# Patient Record
Sex: Female | Born: 1940 | Race: White | Hispanic: No | Marital: Married | Smoking: Never smoker
Health system: Southern US, Community
[De-identification: ages and names within clinical notes are randomized; demographics above are authoritative.]

## PROBLEM LIST (undated history)

## (undated) DIAGNOSIS — Z9289 Personal history of other medical treatment: Secondary | ICD-10-CM

## (undated) DIAGNOSIS — E7800 Pure hypercholesterolemia, unspecified: Secondary | ICD-10-CM

## (undated) DIAGNOSIS — G43909 Migraine, unspecified, not intractable, without status migrainosus: Secondary | ICD-10-CM

## (undated) DIAGNOSIS — J342 Deviated nasal septum: Secondary | ICD-10-CM

## (undated) DIAGNOSIS — I10 Essential (primary) hypertension: Secondary | ICD-10-CM

## (undated) DIAGNOSIS — E785 Hyperlipidemia, unspecified: Secondary | ICD-10-CM

## (undated) DIAGNOSIS — E210 Primary hyperparathyroidism: Secondary | ICD-10-CM

## (undated) DIAGNOSIS — Z8619 Personal history of other infectious and parasitic diseases: Secondary | ICD-10-CM

## (undated) DIAGNOSIS — K219 Gastro-esophageal reflux disease without esophagitis: Secondary | ICD-10-CM

## (undated) DIAGNOSIS — I2510 Atherosclerotic heart disease of native coronary artery without angina pectoris: Secondary | ICD-10-CM

## (undated) HISTORY — DX: Essential (primary) hypertension: I10

## (undated) HISTORY — PX: RHINOPLASTY: SUR1284

## (undated) HISTORY — PX: DILATION AND CURETTAGE OF UTERUS: SHX78

## (undated) HISTORY — PX: BREAST CYST ASPIRATION: SHX578

## (undated) HISTORY — PX: BUNIONECTOMY: SHX129

## (undated) HISTORY — DX: Migraine, unspecified, not intractable, without status migrainosus: G43.909

## (undated) HISTORY — DX: Gastro-esophageal reflux disease without esophagitis: K21.9

## (undated) HISTORY — PX: COLONOSCOPY: SHX174

## (undated) HISTORY — DX: Deviated nasal septum: J34.2

## (undated) HISTORY — DX: Personal history of other infectious and parasitic diseases: Z86.19

## (undated) HISTORY — PX: OTHER SURGICAL HISTORY: SHX169

## (undated) HISTORY — DX: Pure hypercholesterolemia, unspecified: E78.00

## (undated) HISTORY — DX: Personal history of other medical treatment: Z92.89

---
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
## 1968-08-24 HISTORY — PX: NOSE SURGERY: SHX723

## 1999-05-12 ENCOUNTER — Other Ambulatory Visit: Admission: RE | Admit: 1999-05-12 | Discharge: 1999-05-12 | Payer: Self-pay | Admitting: Obstetrics and Gynecology

## 2000-09-16 ENCOUNTER — Other Ambulatory Visit: Admission: RE | Admit: 2000-09-16 | Discharge: 2000-09-16 | Payer: Self-pay | Admitting: Obstetrics and Gynecology

## 2002-04-05 ENCOUNTER — Other Ambulatory Visit: Admission: RE | Admit: 2002-04-05 | Discharge: 2002-04-05 | Payer: Self-pay | Admitting: Obstetrics and Gynecology

## 2003-06-22 ENCOUNTER — Other Ambulatory Visit: Admission: RE | Admit: 2003-06-22 | Discharge: 2003-06-22 | Payer: Self-pay | Admitting: Obstetrics and Gynecology

## 2004-09-17 ENCOUNTER — Other Ambulatory Visit: Admission: RE | Admit: 2004-09-17 | Discharge: 2004-09-17 | Payer: Self-pay | Admitting: *Deleted

## 2004-09-24 ENCOUNTER — Ambulatory Visit: Payer: Self-pay

## 2005-01-14 ENCOUNTER — Ambulatory Visit: Payer: Self-pay | Admitting: Otolaryngology

## 2006-07-12 ENCOUNTER — Ambulatory Visit: Payer: Self-pay | Admitting: Unknown Physician Specialty

## 2006-07-12 LAB — HM COLONOSCOPY

## 2007-01-18 ENCOUNTER — Ambulatory Visit: Payer: Self-pay | Admitting: Obstetrics and Gynecology

## 2007-01-26 ENCOUNTER — Ambulatory Visit: Payer: Self-pay | Admitting: Obstetrics and Gynecology

## 2008-01-29 LAB — HM COLONOSCOPY

## 2011-05-25 LAB — HM MAMMOGRAPHY

## 2011-05-25 LAB — HM PAP SMEAR: HM Pap smear: NORMAL

## 2011-07-24 ENCOUNTER — Ambulatory Visit: Payer: Self-pay | Admitting: Internal Medicine

## 2011-10-16 ENCOUNTER — Ambulatory Visit: Payer: Self-pay | Admitting: Internal Medicine

## 2011-10-27 ENCOUNTER — Ambulatory Visit (INDEPENDENT_AMBULATORY_CARE_PROVIDER_SITE_OTHER): Payer: Self-pay | Admitting: Internal Medicine

## 2011-10-27 ENCOUNTER — Encounter: Payer: Self-pay | Admitting: Internal Medicine

## 2011-10-27 VITALS — BP 142/72 | HR 91 | Temp 98.2°F | Ht 59.5 in | Wt 122.0 lb

## 2011-10-27 DIAGNOSIS — R220 Localized swelling, mass and lump, head: Secondary | ICD-10-CM

## 2011-10-27 DIAGNOSIS — N390 Urinary tract infection, site not specified: Secondary | ICD-10-CM

## 2011-10-27 DIAGNOSIS — Z1239 Encounter for other screening for malignant neoplasm of breast: Secondary | ICD-10-CM

## 2011-10-27 DIAGNOSIS — R300 Dysuria: Secondary | ICD-10-CM

## 2011-10-27 LAB — COMPREHENSIVE METABOLIC PANEL
ALT: 36 U/L — ABNORMAL HIGH (ref 0–35)
AST: 32 U/L (ref 0–37)
Alkaline Phosphatase: 82 U/L (ref 39–117)
Glucose, Bld: 89 mg/dL (ref 70–99)
Sodium: 140 mEq/L (ref 135–145)
Total Bilirubin: 0.4 mg/dL (ref 0.3–1.2)
Total Protein: 7.6 g/dL (ref 6.0–8.3)

## 2011-10-27 LAB — POCT URINALYSIS DIPSTICK
Glucose, UA: NEGATIVE
Nitrite, UA: NEGATIVE
Protein, UA: NEGATIVE
Urobilinogen, UA: 0.2

## 2011-10-27 MED ORDER — SULFAMETHOXAZOLE-TRIMETHOPRIM 800-160 MG PO TABS: 1.0000 | ORAL_TABLET | Freq: Two times a day (BID) | ORAL | Status: AC

## 2011-10-27 NOTE — Assessment & Plan Note (Signed)
Previous lab work shows hypercalcemia. Patient also notes some subjective swelling in her right neck. Question if she may have parathyroid adenoma with primary hyperparathyroidism. Will check calcium and parathyroid hormone level.

## 2011-10-27 NOTE — Assessment & Plan Note (Signed)
Exam is remarkable for some prominence in the right neck but no palpable mass. Will get records on recent ultrasound performed by her previous physician. Followup one month.

## 2011-10-27 NOTE — Assessment & Plan Note (Signed)
Symptoms and urinalysis consistent with drainage tract infection. Will treat with Bactrim double strength tablet twice daily x7 days. Patient will call or return to clinic if symptoms are not improving.

## 2011-10-27 NOTE — Progress Notes (Signed)
Subjective:    Patient ID: Melissa Turner, female    DOB: [birth year], 71 y.o.   MRN: [id number]  HPI 71 year old female with history of hypertension presents to establish care. In regards to her hypertension, she reports that she was recently started on metoprolol. She brings record of her blood pressure which show most blood pressures near 140/80. She has not had any chest pain, headache, palpitations. She was also started on Xanax at the same time, because her physician felt that her hypertension was secondary to anxiety. She notes some improvement with this medication.  She is concerned about a several month history of swelling in her right neck. She notes that she was evaluated by her previous physician with ultrasound of her neck which was reportedly normal. She brings lab work today which is remarkable for elevated calcium level at 10.6. She reports that this was never addressed with additional lab work. She denies any fatigue, bony pain. She reports that she is generally feeling well. She is very active and exercises on a regular basis. She follows a healthy diet.  She is also concerned today about a several day history of foul smell to her urine. She denies any dysuria, urinary frequency, urgency, flank pain, fever, or chills.  Outpatient Encounter Prescriptions as of 10/27/2011  Medication Sig Dispense Refill  . ALPRAZolam (XANAX) 0.25 MG tablet Take 0.25 mg by mouth daily.      Marland Kitchen aspirin 325 MG tablet Take 325 mg by mouth daily.      . metoprolol succinate (TOPROL-XL) 25 MG 24 hr tablet Take 25 mg by mouth daily.      . rosuvastatin (CRESTOR) 5 MG tablet Take 5 mg by mouth daily.      Marland Kitchen sulfamethoxazole-trimethoprim (BACTRIM DS,SEPTRA DS) 800-160 MG per tablet Take 1 tablet by mouth 2 (two) times daily.  14 tablet  0    Review of Systems  Constitutional: Negative for fever, chills, appetite change, fatigue and unexpected weight change.  HENT: Negative for ear pain, congestion, sore  throat, trouble swallowing, neck pain, voice change and sinus pressure.   Eyes: Negative for visual disturbance.  Respiratory: Negative for cough, shortness of breath, wheezing and stridor.   Cardiovascular: Negative for chest pain, palpitations and leg swelling.  Gastrointestinal: Negative for nausea, vomiting, abdominal pain, diarrhea, constipation, blood in stool, abdominal distention and anal bleeding.  Genitourinary: Negative for dysuria and flank pain.  Musculoskeletal: Negative for myalgias, arthralgias and gait problem.  Skin: Negative for color change and rash.  Neurological: Negative for dizziness and headaches.  Hematological: Negative for adenopathy. Does not bruise/bleed easily.  Psychiatric/Behavioral: Negative for suicidal ideas, sleep disturbance and dysphoric mood. The patient is not nervous/anxious.    BP 142/72  Pulse 91  Temp(Src) 98.2 F (36.8 C) (Oral)  Ht 4' 11.5" (1.511 m)  Wt 122 lb (55.339 kg)  BMI 24.23 kg/m2  SpO2 98%     Objective:   Physical Exam  Constitutional: She is oriented to person, place, and time. She appears well-developed and well-nourished. No distress.  HENT:  Head: Normocephalic and atraumatic.  Right Ear: External ear normal.  Left Ear: External ear normal.  Nose: Nose normal.  Mouth/Throat: Oropharynx is clear and moist. No oropharyngeal exudate.  Eyes: Conjunctivae are normal. Pupils are equal, round, and reactive to light. Right eye exhibits no discharge. Left eye exhibits no discharge. No scleral icterus.  Neck: Normal range of motion. Neck supple. Carotid bruit is not present. No tracheal deviation present. No thyromegaly  present.    Cardiovascular: Normal rate, regular rhythm, normal heart sounds and intact distal pulses.  Exam reveals no gallop and no friction rub.   No murmur heard. Pulmonary/Chest: Effort normal and breath sounds normal. No respiratory distress. She has no wheezes. She has no rales. She exhibits no tenderness.   Musculoskeletal: Normal range of motion. She exhibits no edema and no tenderness.  Lymphadenopathy:    She has no cervical adenopathy.  Neurological: She is alert and oriented to person, place, and time. No cranial nerve deficit. She exhibits normal muscle tone. Coordination normal.  Skin: Skin is warm and dry. No rash noted. She is not diaphoretic. No erythema. No pallor.  Psychiatric: She has a normal mood and affect. Her behavior is normal. Judgment and thought content normal.          Assessment & Plan:

## 2011-10-28 ENCOUNTER — Other Ambulatory Visit (INDEPENDENT_AMBULATORY_CARE_PROVIDER_SITE_OTHER): Payer: Medicare Other | Admitting: *Deleted

## 2011-10-28 ENCOUNTER — Telehealth: Payer: Self-pay | Admitting: *Deleted

## 2011-10-28 LAB — PTH, INTACT AND CALCIUM

## 2011-10-28 NOTE — Telephone Encounter (Signed)
Received call from lab - PTH intact and calcium is required to have 2 tubes - SST and FROZEN serum from SST. Left VM for pt to call office back. She will need to come back in for redraw.

## 2011-10-28 NOTE — Telephone Encounter (Signed)
Pt coming in this afternoon  °

## 2011-10-29 LAB — PTH, INTACT AND CALCIUM
Calcium, Total (PTH): 10.4 mg/dL (ref 8.4–10.5)
PTH: 61.8 pg/mL (ref 14.0–72.0)

## 2011-11-02 ENCOUNTER — Telehealth: Payer: Self-pay | Admitting: *Deleted

## 2011-11-02 ENCOUNTER — Ambulatory Visit (INDEPENDENT_AMBULATORY_CARE_PROVIDER_SITE_OTHER): Payer: Medicare Other | Admitting: *Deleted

## 2011-11-02 DIAGNOSIS — N390 Urinary tract infection, site not specified: Secondary | ICD-10-CM

## 2011-11-02 DIAGNOSIS — R7989 Other specified abnormal findings of blood chemistry: Secondary | ICD-10-CM

## 2011-11-02 LAB — CULTURE, URINE COMPREHENSIVE: Colony Count: 10000

## 2011-11-02 NOTE — Telephone Encounter (Signed)
Referral entered, Patient informed  

## 2011-11-02 NOTE — Telephone Encounter (Signed)
Message copied by Vernie Murders on Mon Nov 02, 2011 12:45 PM ------      Message from: Ronna Polio A      Created: Thu Oct 29, 2011 12:12 PM       PTH is elevated, based on where calcium level is.  This is consistent with primary hyperparathyroidism. Given that she is experiencing swelling in her right neck, I would like to refer to endocrinology for evaluation and sestambi scan.

## 2011-11-04 LAB — URINE CULTURE: Colony Count: NO GROWTH

## 2011-11-10 ENCOUNTER — Encounter: Payer: Self-pay | Admitting: Internal Medicine

## 2011-11-12 ENCOUNTER — Encounter: Payer: Self-pay | Admitting: Internal Medicine

## 2011-11-16 ENCOUNTER — Ambulatory Visit: Payer: Self-pay | Admitting: Internal Medicine

## 2011-11-27 ENCOUNTER — Ambulatory Visit (INDEPENDENT_AMBULATORY_CARE_PROVIDER_SITE_OTHER): Payer: Medicare Other | Admitting: Internal Medicine

## 2011-11-27 ENCOUNTER — Encounter: Payer: Self-pay | Admitting: Internal Medicine

## 2011-11-27 VITALS — BP 138/86 | HR 105 | Temp 98.4°F | Ht 59.5 in | Wt 123.0 lb

## 2011-11-27 DIAGNOSIS — I10 Essential (primary) hypertension: Secondary | ICD-10-CM

## 2011-11-27 DIAGNOSIS — R221 Localized swelling, mass and lump, neck: Secondary | ICD-10-CM

## 2011-11-27 DIAGNOSIS — E210 Primary hyperparathyroidism: Secondary | ICD-10-CM

## 2011-11-27 DIAGNOSIS — R220 Localized swelling, mass and lump, head: Secondary | ICD-10-CM

## 2011-11-27 MED ORDER — METOPROLOL SUCCINATE ER 25 MG PO TB24: 25.0000 mg | ORAL_TABLET | Freq: Every day | ORAL | Status: DC

## 2011-11-27 NOTE — Assessment & Plan Note (Signed)
Suspect this may be secondary to parathyroid adenoma. As below, will set up with endocrinology for sestamibi scan and further evaluation.

## 2011-11-27 NOTE — Assessment & Plan Note (Signed)
Blood pressure is well-controlled today. Will continue metoprolol. Recent renal function was normal. Plan to repeat in 6 months.

## 2011-11-27 NOTE — Progress Notes (Signed)
  Subjective:    Patient ID: Melissa Turner, female    DOB: Jan 27, 1941, 71 y.o.   MRN: [id number]  HPI  71 year old female with history of hypertension and recent history of swelling in her right neck presents for followup. At her last visit, we send lab work including calcium level which was mildly elevated at 10.4 and PTH level which was inappropriately elevated at 61. We reviewed lab work today and discussed diagnosis of primary hyperparathyroidism. We discussed need for referral to endocrinology. Patient acknowledges some fatigue over the last 6-12 months. She also notes worsening of her depression. She has not had any kidney stones. She has never had evaluation of bone density. She continues to be concerned about swelling in her right neck.  Outpatient Encounter Prescriptions as of 11/27/2011  Medication Sig Dispense Refill  . ALPRAZolam (XANAX) 0.25 MG tablet Take 0.25 mg by mouth daily.      Marland Kitchen aspirin 325 MG tablet Take 325 mg by mouth daily.      . metoprolol succinate (TOPROL-XL) 25 MG 24 hr tablet Take 1 tablet (25 mg total) by mouth daily.  90 tablet  1  . rosuvastatin (CRESTOR) 5 MG tablet Take 5 mg by mouth daily.      Marland Kitchen DISCONTD: metoprolol succinate (TOPROL-XL) 25 MG 24 hr tablet Take 25 mg by mouth daily.      Marland Kitchen DISCONTD: metoprolol succinate (TOPROL-XL) 25 MG 24 hr tablet Take 1 tablet (25 mg total) by mouth daily.  90 tablet  1    Review of Systems  Constitutional: Positive for fatigue. Negative for fever and chills.  HENT: Negative for neck pain and neck stiffness.   Respiratory: Negative for cough and shortness of breath.   Cardiovascular: Negative for chest pain.  Gastrointestinal: Negative for abdominal pain.  Genitourinary: Negative for dysuria, frequency and flank pain.   BP 138/86  Pulse 105  Temp(Src) 98.4 F (36.9 C) (Oral)  Ht 4' 11.5" (1.511 m)  Wt 123 lb (55.792 kg)  BMI 24.43 kg/m2  SpO2 95%     Objective:   Physical Exam  Constitutional: She is  oriented to person, place, and time. She appears well-developed and well-nourished. No distress.  HENT:  Head: Normocephalic and atraumatic.  Right Ear: External ear normal.  Left Ear: External ear normal.  Nose: Nose normal.  Mouth/Throat: Oropharynx is clear and moist. No oropharyngeal exudate.  Eyes: Conjunctivae are normal. Pupils are equal, round, and reactive to light. Right eye exhibits no discharge. Left eye exhibits no discharge. No scleral icterus.  Neck: Normal range of motion. Neck supple. No tracheal deviation present. No thyromegaly present.    Cardiovascular: Normal rate, regular rhythm, normal heart sounds and intact distal pulses.  Exam reveals no gallop and no friction rub.   No murmur heard. Pulmonary/Chest: Effort normal and breath sounds normal. No respiratory distress. She has no wheezes. She has no rales. She exhibits no tenderness.  Musculoskeletal: Normal range of motion. She exhibits no edema and no tenderness.  Lymphadenopathy:    She has no cervical adenopathy.  Neurological: She is alert and oriented to person, place, and time. No cranial nerve deficit. She exhibits normal muscle tone. Coordination normal.  Skin: Skin is warm and dry. No rash noted. She is not diaphoretic. No erythema. No pallor.  Psychiatric: She has a normal mood and affect. Her behavior is normal. Judgment and thought content normal.          Assessment & Plan:

## 2011-11-27 NOTE — Assessment & Plan Note (Signed)
Mild hypercalcemia and elevated PTH are consistent with primary hyperparathyroidism. We discussed this in detail today. Will set up referral to endocrinology for further evaluation.

## 2011-11-28 ENCOUNTER — Encounter: Payer: Self-pay | Admitting: Internal Medicine

## 2011-12-07 ENCOUNTER — Encounter: Payer: Self-pay | Admitting: Internal Medicine

## 2011-12-08 ENCOUNTER — Ambulatory Visit: Payer: Medicare Other | Admitting: Endocrinology

## 2012-01-29 ENCOUNTER — Encounter: Payer: Self-pay | Admitting: Internal Medicine

## 2012-01-29 ENCOUNTER — Ambulatory Visit (INDEPENDENT_AMBULATORY_CARE_PROVIDER_SITE_OTHER): Payer: Medicare Other | Admitting: Internal Medicine

## 2012-01-29 VITALS — BP 128/86 | HR 69 | Temp 98.0°F | Resp 16 | Wt 126.5 lb

## 2012-01-29 DIAGNOSIS — E210 Primary hyperparathyroidism: Secondary | ICD-10-CM

## 2012-01-29 DIAGNOSIS — Z0000 Encounter for general adult medical examination without abnormal findings: Secondary | ICD-10-CM

## 2012-01-29 DIAGNOSIS — E785 Hyperlipidemia, unspecified: Secondary | ICD-10-CM

## 2012-01-29 NOTE — Assessment & Plan Note (Signed)
Recently seen by endocrinology for evaluation of hypercalcemia and elevated PTH. Plan was to repeat levels in 6 months. Patient currently asymptomatic. Will continue to monitor.

## 2012-01-29 NOTE — Progress Notes (Signed)
Subjective:    Patient ID: Melissa Turner, female    DOB: 02-Aug-1941, 71 y.o.   MRN: [id number]  HPI 71 year old female with history of hypercalcemia, elevated PTH consistent with primary hyperparathyroidism, hyperlipidemia and hypertension presents for followup. She was recently seen by endocrinology and had ultrasound of her neck which showed no abnormalities of the thyroid or parathyroid. She also had repeat calcium which was 10.3 and parathyroid hormone which was 30. Plan made by endocrinology was to repeat calcium and parathyroid hormone levels in 6 months. She reports that she is generally feeling well. She reports normal energy level and denies any myalgia or bony pain. She has been active and is exercising 30 minutes per day. She denies any new concerns today.  Outpatient Prescriptions Prior to Visit  Medication Sig Dispense Refill  . ALPRAZolam (XANAX) 0.25 MG tablet Take 0.25 mg by mouth daily.      Marland Kitchen aspirin 325 MG tablet Take 325 mg by mouth daily.      . metoprolol succinate (TOPROL-XL) 25 MG 24 hr tablet Take 1 tablet (25 mg total) by mouth daily.  90 tablet  1  . rosuvastatin (CRESTOR) 5 MG tablet Take 5 mg by mouth daily.        Review of Systems  Constitutional: Negative for fever, chills, appetite change, fatigue and unexpected weight change.  HENT: Negative for ear pain, congestion, sore throat, trouble swallowing, neck pain, voice change and sinus pressure.   Eyes: Negative for visual disturbance.  Respiratory: Negative for cough, shortness of breath, wheezing and stridor.   Cardiovascular: Negative for chest pain, palpitations and leg swelling.  Gastrointestinal: Negative for nausea, vomiting, abdominal pain, diarrhea, constipation, blood in stool, abdominal distention and anal bleeding.  Genitourinary: Negative for dysuria and flank pain.  Musculoskeletal: Negative for myalgias, arthralgias and gait problem.  Skin: Negative for color change and rash.  Neurological:  Negative for dizziness and headaches.  Hematological: Negative for adenopathy. Does not bruise/bleed easily.  Psychiatric/Behavioral: Negative for suicidal ideas, sleep disturbance and dysphoric mood. The patient is not nervous/anxious.    BP 128/86  Pulse 69  Temp(Src) 98 F (36.7 C) (Oral)  Resp 16  Wt 126 lb 8 oz (57.38 kg)  SpO2 98%     Objective:   Physical Exam  Constitutional: She is oriented to person, place, and time. She appears well-developed and well-nourished. No distress.  HENT:  Head: Normocephalic and atraumatic.  Right Ear: External ear normal.  Left Ear: External ear normal.  Nose: Nose normal.  Mouth/Throat: Oropharynx is clear and moist. No oropharyngeal exudate.  Eyes: Conjunctivae are normal. Pupils are equal, round, and reactive to light. Right eye exhibits no discharge. Left eye exhibits no discharge. No scleral icterus.  Neck: Normal range of motion. Neck supple. No tracheal deviation present. No thyromegaly present.  Cardiovascular: Normal rate, regular rhythm, normal heart sounds and intact distal pulses.  Exam reveals no gallop and no friction rub.   No murmur heard. Pulmonary/Chest: Effort normal and breath sounds normal. No respiratory distress. She has no wheezes. She has no rales. She exhibits no tenderness.  Musculoskeletal: Normal range of motion. She exhibits no edema and no tenderness.  Lymphadenopathy:    She has no cervical adenopathy.  Neurological: She is alert and oriented to person, place, and time. No cranial nerve deficit. She exhibits normal muscle tone. Coordination normal.  Skin: Skin is warm and dry. No rash noted. She is not diaphoretic. No erythema. No pallor.  Psychiatric: She has  a normal mood and affect. Her behavior is normal. Judgment and thought content normal.          Assessment & Plan:

## 2012-01-29 NOTE — Assessment & Plan Note (Signed)
Will plan to recheck lipids and LFTs with labs in October 2013.

## 2012-02-04 ENCOUNTER — Telehealth: Payer: Self-pay | Admitting: Internal Medicine

## 2012-02-04 NOTE — Telephone Encounter (Signed)
Error

## 2012-02-08 ENCOUNTER — Telehealth: Payer: Self-pay | Admitting: Internal Medicine

## 2012-02-08 NOTE — Telephone Encounter (Signed)
Left message on machine at home for patient to return call. 

## 2012-02-08 NOTE — Telephone Encounter (Signed)
Bone density was significantly decreased compared to previous, however scores are still above fracture risk threshold. I would recommend weight bearing activity such as walking on a regular basis. We should check Vit D level to make sure adequate. And, we should repeat bone density testing in 2 years.

## 2012-02-09 NOTE — Telephone Encounter (Signed)
Patient advised as instructed via telephone, she stated that she had vit d labs checked by Dr. Tedd Sias at [facility].  I will call [facility] to request a copy of the vit d labs.

## 2012-02-12 ENCOUNTER — Encounter: Payer: Self-pay | Admitting: Internal Medicine

## 2012-04-11 NOTE — Progress Notes (Signed)
  Subjective:    Patient ID: Melissa Turner, female    DOB: 1941/01/16, 71 y.o.   MRN: [id number]  HPI    Review of Systems     Objective:   Physical Exam        Assessment & Plan:  Nurse only

## 2012-04-15 ENCOUNTER — Other Ambulatory Visit: Payer: Self-pay | Admitting: *Deleted

## 2012-04-15 MED ORDER — ALPRAZOLAM 0.25 MG PO TABS: 0.2500 mg | ORAL_TABLET | Freq: Every day | ORAL | Status: DC

## 2012-04-15 MED ORDER — ROSUVASTATIN CALCIUM 5 MG PO TABS: 5.0000 mg | ORAL_TABLET | Freq: Every day | ORAL | Status: DC

## 2012-04-15 NOTE — Telephone Encounter (Signed)
Rx for Alprazolam called to pharmacy, patient advised via telephone. 

## 2012-04-29 ENCOUNTER — Ambulatory Visit (INDEPENDENT_AMBULATORY_CARE_PROVIDER_SITE_OTHER): Payer: Medicare Other | Admitting: Internal Medicine

## 2012-04-29 ENCOUNTER — Encounter: Payer: Self-pay | Admitting: Internal Medicine

## 2012-04-29 VITALS — BP 150/90 | HR 100 | Temp 98.4°F | Ht 59.5 in | Wt 124.8 lb

## 2012-04-29 DIAGNOSIS — E210 Primary hyperparathyroidism: Secondary | ICD-10-CM

## 2012-04-29 DIAGNOSIS — E785 Hyperlipidemia, unspecified: Secondary | ICD-10-CM

## 2012-04-29 DIAGNOSIS — R5381 Other malaise: Secondary | ICD-10-CM

## 2012-04-29 DIAGNOSIS — I10 Essential (primary) hypertension: Secondary | ICD-10-CM

## 2012-04-29 DIAGNOSIS — R5383 Other fatigue: Secondary | ICD-10-CM

## 2012-04-29 LAB — TSH: TSH: 2.47 u[IU]/mL (ref 0.35–5.50)

## 2012-04-29 LAB — COMPREHENSIVE METABOLIC PANEL
ALT: 32 U/L (ref 0–35)
AST: 32 U/L (ref 0–37)
Albumin: 4.9 g/dL (ref 3.5–5.2)
CO2: 23 mEq/L (ref 19–32)
Calcium: 10.3 mg/dL (ref 8.4–10.5)
Chloride: 105 mEq/L (ref 96–112)
Creatinine, Ser: 0.9 mg/dL (ref 0.4–1.2)
GFR: 70.42 mL/min (ref >60.00)
Potassium: 4.1 mEq/L (ref 3.5–5.1)

## 2012-04-29 LAB — LIPID PANEL
Cholesterol: 173 mg/dL (ref 0–200)
HDL: 38 mg/dL — ABNORMAL LOW (ref >39.00)
Total CHOL/HDL Ratio: 5
Triglycerides: 282 mg/dL — ABNORMAL HIGH (ref 0.0–149.0)
VLDL: 56.4 mg/dL — ABNORMAL HIGH (ref 0.0–40.0)

## 2012-04-29 LAB — T3, FREE: T3, Free: 3 pg/mL (ref 2.3–4.2)

## 2012-04-29 LAB — T4, FREE: Free T4: 0.86 ng/dL (ref 0.60–1.60)

## 2012-04-29 NOTE — Assessment & Plan Note (Signed)
History of hyperparathyroidism. Will check calcium and PTH levels with labs today. Follow up 3 months and prn.

## 2012-04-29 NOTE — Progress Notes (Signed)
  Subjective:    Patient ID: Melissa Turner, female    DOB: [birth year], 71 y.o.   MRN: [id number]  HPI 71 year old female with history of hyperlipidemia and primary hyperparathyroidism presents for followup. Over the last few months, she has noticed increased achiness in her legs. She questions whether that may be secondary to hyperparathyroidism. She is also concerned about the possibility of hypothyroidism. She notes some ongoing fatigue. In regards to hyperlipidemia, she reports full compliance with her medication. She denies any side effects such as myalgia.  Outpatient Encounter Prescriptions as of 04/29/2012  Medication Sig Dispense Refill  . ALPRAZolam (XANAX) 0.25 MG tablet Take 1 tablet (0.25 mg total) by mouth daily.  30 tablet  3  . aspirin 325 MG tablet Take 325 mg by mouth daily.      . metoprolol succinate (TOPROL-XL) 25 MG 24 hr tablet Take 1 tablet (25 mg total) by mouth daily.  90 tablet  1  . rosuvastatin (CRESTOR) 5 MG tablet Take 1 tablet (5 mg total) by mouth daily.  30 tablet  11    Review of Systems  Constitutional: Positive for fatigue. Negative for fever, chills, appetite change and unexpected weight change.  HENT: Negative for ear pain, congestion, sore throat, trouble swallowing, neck pain, voice change and sinus pressure.   Eyes: Negative for visual disturbance.  Respiratory: Negative for cough, shortness of breath, wheezing and stridor.   Cardiovascular: Negative for chest pain, palpitations and leg swelling.  Gastrointestinal: Negative for nausea, vomiting, abdominal pain, diarrhea, constipation, blood in stool, abdominal distention and anal bleeding.  Genitourinary: Negative for dysuria and flank pain.  Musculoskeletal: Positive for arthralgias. Negative for myalgias and gait problem.  Skin: Negative for color change and rash.  Neurological: Negative for dizziness and headaches.  Hematological: Negative for adenopathy. Does not bruise/bleed easily.    Psychiatric/Behavioral: Negative for suicidal ideas, disturbed wake/sleep cycle and dysphoric mood. The patient is not nervous/anxious.        Objective:   Physical Exam  Constitutional: She is oriented to person, place, and time. She appears well-developed and well-nourished. No distress.  HENT:  Head: Normocephalic and atraumatic.  Right Ear: External ear normal.  Left Ear: External ear normal.  Nose: Nose normal.  Mouth/Throat: Oropharynx is clear and moist. No oropharyngeal exudate.  Eyes: Conjunctivae are normal. Pupils are equal, round, and reactive to light. Right eye exhibits no discharge. Left eye exhibits no discharge. No scleral icterus.  Neck: Normal range of motion. Neck supple. No tracheal deviation present. No thyromegaly present.  Cardiovascular: Normal rate, regular rhythm, normal heart sounds and intact distal pulses.  Exam reveals no gallop and no friction rub.   No murmur heard. Pulmonary/Chest: Effort normal and breath sounds normal. No respiratory distress. She has no wheezes. She has no rales. She exhibits no tenderness.  Musculoskeletal: Normal range of motion. She exhibits no edema and no tenderness.  Lymphadenopathy:    She has no cervical adenopathy.  Neurological: She is alert and oriented to person, place, and time. No cranial nerve deficit. She exhibits normal muscle tone. Coordination normal.  Skin: Skin is warm and dry. No rash noted. She is not diaphoretic. No erythema. No pallor.  Psychiatric: She has a normal mood and affect. Her behavior is normal. Judgment and thought content normal.          Assessment & Plan:

## 2012-04-29 NOTE — Assessment & Plan Note (Signed)
Blood pressure slightly elevated today however patient did not take medication. She will continue to monitor blood pressure at home and call if blood pressure greater than 140/90 consistently. Followup 3 months.

## 2012-04-29 NOTE — Assessment & Plan Note (Signed)
Will check lipids and LFTs with labs today. Continue Crestor. Follow up 3 months.

## 2012-05-02 LAB — LDL CHOLESTEROL, DIRECT: Direct LDL: 100.6 mg/dL

## 2012-05-02 LAB — PTH, INTACT AND CALCIUM
Calcium, Total (PTH): 10.6 mg/dL — ABNORMAL HIGH (ref 8.4–10.5)
PTH: 30.8 pg/mL (ref 14.0–72.0)

## 2012-08-01 ENCOUNTER — Ambulatory Visit: Payer: Medicare Other | Admitting: Internal Medicine

## 2012-09-12 ENCOUNTER — Ambulatory Visit: Payer: Medicare Other | Admitting: Internal Medicine

## 2012-09-23 ENCOUNTER — Ambulatory Visit (INDEPENDENT_AMBULATORY_CARE_PROVIDER_SITE_OTHER): Payer: Medicare Other | Admitting: Internal Medicine

## 2012-09-23 ENCOUNTER — Encounter: Payer: Self-pay | Admitting: Internal Medicine

## 2012-09-23 VITALS — BP 168/70 | HR 82 | Temp 98.5°F | Ht 59.75 in | Wt 124.0 lb

## 2012-09-23 DIAGNOSIS — I10 Essential (primary) hypertension: Secondary | ICD-10-CM

## 2012-09-23 DIAGNOSIS — F411 Generalized anxiety disorder: Secondary | ICD-10-CM

## 2012-09-23 DIAGNOSIS — F419 Anxiety disorder, unspecified: Secondary | ICD-10-CM | POA: Insufficient documentation

## 2012-09-23 DIAGNOSIS — E210 Primary hyperparathyroidism: Secondary | ICD-10-CM

## 2012-09-23 DIAGNOSIS — E785 Hyperlipidemia, unspecified: Secondary | ICD-10-CM

## 2012-09-23 LAB — COMPREHENSIVE METABOLIC PANEL
ALT: 34 U/L (ref 0–35)
AST: 29 U/L (ref 0–37)
Alkaline Phosphatase: 77 U/L (ref 39–117)
BUN: 14 mg/dL (ref 6–23)
Calcium: 9.9 mg/dL (ref 8.4–10.5)
Chloride: 103 mEq/L (ref 96–112)
Creatinine, Ser: 0.7 mg/dL (ref 0.4–1.2)
Total Bilirubin: 0.7 mg/dL (ref 0.3–1.2)

## 2012-09-23 MED ORDER — ALPRAZOLAM 0.25 MG PO TABS: 0.2500 mg | ORAL_TABLET | Freq: Every day | ORAL | Status: DC

## 2012-09-23 MED ORDER — METOPROLOL SUCCINATE ER 50 MG PO TB24: 50.0000 mg | ORAL_TABLET | Freq: Every day | ORAL | Status: DC

## 2012-09-23 NOTE — Progress Notes (Signed)
Subjective:    Patient ID: Melissa Turner, female    DOB: 19-Dec-1940, 72 y.o.   MRN: [id number]  HPI 72 year old female with history of hypertension, hyperlipidemia, and anxiety presents for followup. She reports she is generally feeling well. She continues to have some ongoing fatigue which she questions may be related to her history of primary hyperparathyroidism. She notes increased stress at work. She is also providing care for her son and mother. She has been using Xanax as needed for episodes of extreme anxiety with some improvement. Next  In regards to hypertension, she notes that at home her blood pressure is typically 140s over 90s. She denies any palpitations, headache, chest pain. Reports compliance with meds.  Outpatient Encounter Prescriptions as of 09/23/2012  Medication Sig Dispense Refill  . ALPRAZolam (XANAX) 0.25 MG tablet Take 1 tablet (0.25 mg total) by mouth daily.  30 tablet  3  . aspirin 325 MG tablet Take 325 mg by mouth daily.      . metoprolol succinate (TOPROL-XL) 50 MG 24 hr tablet Take 1 tablet (50 mg total) by mouth daily.  90 tablet  1  . rosuvastatin (CRESTOR) 5 MG tablet Take 1 tablet (5 mg total) by mouth daily.  30 tablet  11   BP 168/70  Pulse 82  Temp 98.5 F (36.9 C)  Ht 4' 11.75" (1.518 m)  Wt 124 lb (56.246 kg)  BMI 24.42 kg/m2  SpO2 97%  Review of Systems  Constitutional: Negative for fever, chills, appetite change, fatigue and unexpected weight change.  HENT: Negative for ear pain, congestion, sore throat, trouble swallowing, neck pain, voice change and sinus pressure.   Eyes: Negative for visual disturbance.  Respiratory: Negative for cough, shortness of breath, wheezing and stridor.   Cardiovascular: Negative for chest pain, palpitations and leg swelling.  Gastrointestinal: Negative for nausea, vomiting, abdominal pain, diarrhea, constipation, blood in stool, abdominal distention and anal bleeding.  Genitourinary: Negative for dysuria and  flank pain.  Musculoskeletal: Negative for myalgias, arthralgias and gait problem.  Skin: Negative for color change and rash.  Neurological: Negative for dizziness and headaches.  Hematological: Negative for adenopathy. Does not bruise/bleed easily.  Psychiatric/Behavioral: Positive for sleep disturbance. Negative for suicidal ideas and dysphoric mood. The patient is nervous/anxious.        Objective:   Physical Exam  Constitutional: She is oriented to person, place, and time. She appears well-developed and well-nourished. No distress.  HENT:  Head: Normocephalic and atraumatic.  Right Ear: External ear normal.  Left Ear: External ear normal.  Nose: Nose normal.  Mouth/Throat: Oropharynx is clear and moist. No oropharyngeal exudate.  Eyes: Conjunctivae normal are normal. Pupils are equal, round, and reactive to light. Right eye exhibits no discharge. Left eye exhibits no discharge. No scleral icterus.  Neck: Normal range of motion. Neck supple. No tracheal deviation present. No thyromegaly present.  Cardiovascular: Normal rate, regular rhythm, normal heart sounds and intact distal pulses.  Exam reveals no gallop and no friction rub.   No murmur heard. Pulmonary/Chest: Effort normal and breath sounds normal. No respiratory distress. She has no wheezes. She has no rales. She exhibits no tenderness.  Musculoskeletal: Normal range of motion. She exhibits no edema and no tenderness.  Lymphadenopathy:    She has no cervical adenopathy.  Neurological: She is alert and oriented to person, place, and time. No cranial nerve deficit. She exhibits normal muscle tone. Coordination normal.  Skin: Skin is warm and dry. No rash noted. She is not  diaphoretic. No erythema. No pallor.  Psychiatric: She has a normal mood and affect. Her behavior is normal. Judgment and thought content normal.          Assessment & Plan:

## 2012-09-23 NOTE — Assessment & Plan Note (Signed)
Persistent symptoms of fatigue. Will recheck PTH and calcium.

## 2012-09-23 NOTE — Assessment & Plan Note (Signed)
Continue alprazolam prn. If symptoms persistent, discussed starting SSRI. Pt will call or email if she would like to start long acting medication.

## 2012-09-23 NOTE — Assessment & Plan Note (Signed)
Blood pressure slightly elevated today and has been elevated at home. Will increase metoprolol to 50 mg daily. Patient will monitor blood pressure at home and call or e-mail with readings. Followup in 3 months.

## 2012-10-03 ENCOUNTER — Telehealth: Payer: Self-pay | Admitting: Internal Medicine

## 2012-10-03 NOTE — Telephone Encounter (Signed)
Called and spoke with patient, she stated she really did not understand the lab note on mychart. Informed patient labs showed an abnormal thyroid level and Dr. Dan Humphreys would like for her to see the endocrinologist. Per patient she has been seen by endo previously, so she can call to schedule that appt.

## 2012-10-03 NOTE — Telephone Encounter (Signed)
Pt came by and was wanting to know about her labs results in detail and about having a referral.

## 2012-10-08 ENCOUNTER — Other Ambulatory Visit: Payer: Self-pay

## 2012-11-07 ENCOUNTER — Telehealth: Payer: Self-pay | Admitting: Internal Medicine

## 2012-11-07 MED ORDER — ROSUVASTATIN CALCIUM 5 MG PO TABS: 5.0000 mg | ORAL_TABLET | Freq: Every day | ORAL | Status: DC

## 2012-11-07 NOTE — Telephone Encounter (Signed)
Pt has one pill left of her Crestor and she would like to try Lipitor or Zocor if possible.

## 2012-11-07 NOTE — Telephone Encounter (Signed)
LMTCB Crestor already sent

## 2012-11-07 NOTE — Telephone Encounter (Signed)
Will this be ok? 

## 2012-11-07 NOTE — Telephone Encounter (Signed)
Is this because of cost? We can try Atorvastatin 10mg  daily. #30, 3 refill. We will need to repeat lipids and CMP in 1 month after starting.

## 2012-11-09 MED ORDER — ATORVASTATIN CALCIUM 10 MG PO TABS: 10.0000 mg | ORAL_TABLET | Freq: Every day | ORAL | Status: DC

## 2012-11-09 NOTE — Telephone Encounter (Signed)
Rx for Atorvastatin sent to pharmacy, left message for patient to call back.

## 2012-11-10 NOTE — Telephone Encounter (Signed)
Patient aware to come in for fasting labs in a month, she has an appointment with Dr. Dan Humphreys 4/21

## 2012-11-14 ENCOUNTER — Ambulatory Visit: Payer: Self-pay

## 2012-12-12 ENCOUNTER — Encounter: Payer: Self-pay | Admitting: Internal Medicine

## 2012-12-12 ENCOUNTER — Ambulatory Visit (INDEPENDENT_AMBULATORY_CARE_PROVIDER_SITE_OTHER): Payer: Medicare Other | Admitting: Internal Medicine

## 2012-12-12 VITALS — BP 154/88 | HR 73 | Temp 98.2°F | Ht 59.5 in | Wt 125.0 lb

## 2012-12-12 DIAGNOSIS — M542 Cervicalgia: Secondary | ICD-10-CM

## 2012-12-12 DIAGNOSIS — Z23 Encounter for immunization: Secondary | ICD-10-CM

## 2012-12-12 DIAGNOSIS — E210 Primary hyperparathyroidism: Secondary | ICD-10-CM

## 2012-12-12 DIAGNOSIS — F419 Anxiety disorder, unspecified: Secondary | ICD-10-CM

## 2012-12-12 DIAGNOSIS — E785 Hyperlipidemia, unspecified: Secondary | ICD-10-CM

## 2012-12-12 DIAGNOSIS — I10 Essential (primary) hypertension: Secondary | ICD-10-CM

## 2012-12-12 DIAGNOSIS — F411 Generalized anxiety disorder: Secondary | ICD-10-CM

## 2012-12-12 DIAGNOSIS — Z0000 Encounter for general adult medical examination without abnormal findings: Secondary | ICD-10-CM

## 2012-12-12 LAB — CBC WITH DIFFERENTIAL/PLATELET
Basophils Absolute: 0 10*3/uL (ref 0.0–0.1)
Eosinophils Absolute: 0.2 10*3/uL (ref 0.0–0.7)
Eosinophils Relative: 2.8 % (ref 0.0–5.0)
HCT: 43.2 % (ref 36.0–46.0)
Lymphs Abs: 1.7 10*3/uL (ref 0.7–4.0)
MCHC: 33.8 g/dL (ref 30.0–36.0)
MCV: 94.3 fl (ref 78.0–100.0)
Monocytes Absolute: 0.5 10*3/uL (ref 0.1–1.0)
Platelets: 264 10*3/uL (ref 150.0–400.0)
RDW: 12.7 % (ref 11.5–14.6)

## 2012-12-12 LAB — TSH: TSH: 1.54 u[IU]/mL (ref 0.35–5.50)

## 2012-12-12 LAB — COMPREHENSIVE METABOLIC PANEL
BUN: 15 mg/dL (ref 6–23)
CO2: 30 mEq/L (ref 19–32)
Calcium: 9.8 mg/dL (ref 8.4–10.5)
Chloride: 103 mEq/L (ref 96–112)
Creatinine, Ser: 0.7 mg/dL (ref 0.4–1.2)
GFR: 89.42 mL/min (ref >60.00)
Total Bilirubin: 0.8 mg/dL (ref 0.3–1.2)

## 2012-12-12 LAB — LIPID PANEL
Cholesterol: 187 mg/dL (ref 0–200)
HDL: 28.8 mg/dL — ABNORMAL LOW (ref >39.00)
Triglycerides: 112 mg/dL (ref 0.0–149.0)

## 2012-12-12 NOTE — Assessment & Plan Note (Signed)
General medical exam including breast exam normal today. Pap and pelvic deferred per patient preference and given normal pelvic and Pap in 2012. Appropriate screening performed. Encourage continued efforts at healthy diet and regular physical activity. Will check labs today including CBC, CMP, lipid profile, and hepatitis C. Mammogram scheduled.

## 2012-12-12 NOTE — Assessment & Plan Note (Signed)
Patient reports that recent sestamibi scan was negative. However, calcium and PTH level continues to be elevated. Will check vitamin D level with labs today. Question she may have secondary hyperparathyroidism from vit D deficiency.

## 2012-12-12 NOTE — Assessment & Plan Note (Addendum)
Chronic right-sided neck pain. Extensive evaluation including ENT evaluation has been normal. Will get carotid dopplers for further evaluation. Question if aneurysm might lead to symptoms of chronic mild pain.

## 2012-12-12 NOTE — Progress Notes (Signed)
Subjective:    Patient ID: Melissa Turner, female    DOB: 12/04/1940, 72 y.o.   MRN: [id number]  HPI The patient is here for annual Medicare wellness examination and management of other chronic and acute problems.   The risk factors are reflected in the social history.  The roster of all physicians providing medical care to patient - is listed in the Snapshot section of the chart.  Activities of daily living:  The patient is 100% independent in all ADLs: dressing, toileting, feeding as well as independent mobility  Home safety : The patient has smoke detectors in the home. They wear seatbelts.  There are no firearms at home. There is no violence in the home.   There is no risks for hepatitis, STDs or HIV. There is a history of blood transfusion with childbirth in 1967. They have no travel history to infectious disease endemic areas of the world.  The patient has seen their dentist in the last six month. (Dr. Lane Hacker) They have seen their eye doctor in the last year. [facility]) No issues with hearing.  They have deferred audiologic testing in the last year.  Testing in past was normal. do not  have excessive sun exposure. Discussed the need for sun protection: hats, long sleeves and use of sunscreen if there is significant sun exposure. (Dr. Gwen Pounds, [facility])  Diet: the importance of a healthy diet is discussed. They do have a healthy diet.  The benefits of regular aerobic exercise were discussed. She is very active and exercises regularly.  Depression screen: there are no signs or vegative symptoms of depression- irritability, change in appetite, anhedonia, sadness/tearfullness.  Cognitive assessment: the patient manages all their financial and personal affairs and is actively engaged. They could relate day,date,year and events.  The following portions of the patient's history were reviewed and updated as appropriate: allergies, current medications, past family history, past  medical history,  past surgical history, past social history  and problem list.  Visual acuity was not assessed per patient preference since she has regular follow up with her ophthalmologist. Hearing and body mass index were assessed and reviewed.   During the course of the visit the patient was educated and counseled about appropriate screening and preventive services including : fall prevention , diabetes screening, nutrition counseling, colorectal cancer screening, and recommended immunizations.    Patient continues to have right-sided neck pain. This has been ongoing for years. Has been evaluated by ENT physician. No etiology has been determined. She continues to describe aching pain in her right neck, just under her right ear which is chronic. She does not take medication for this.  Outpatient Encounter Prescriptions as of 12/12/2012  Medication Sig Dispense Refill  . ALPRAZolam (XANAX) 0.25 MG tablet Take 1 tablet (0.25 mg total) by mouth daily.  30 tablet  3  . aspirin 325 MG tablet Take 325 mg by mouth daily.      Marland Kitchen atorvastatin (LIPITOR) 10 MG tablet Take 1 tablet (10 mg total) by mouth daily.  30 tablet  3  . metoprolol succinate (TOPROL-XL) 50 MG 24 hr tablet Take 1 tablet (50 mg total) by mouth daily.  90 tablet  1  . rosuvastatin (CRESTOR) 5 MG tablet Take 1 tablet (5 mg total) by mouth daily.  30 tablet  5   No facility-administered encounter medications on file as of 12/12/2012.   BP 154/88  Pulse 73  Temp(Src) 98.2 F (36.8 C) (Oral)  Ht 4' 11.5" (1.511 m)  Wt 125 lb (56.7 kg)  BMI 24.83 kg/m2  SpO2 97%  Review of Systems  Constitutional: Negative for fever, chills, appetite change, fatigue and unexpected weight change.  HENT: Positive for neck pain. Negative for ear pain, congestion, sore throat, trouble swallowing, voice change and sinus pressure.   Eyes: Negative for visual disturbance.  Respiratory: Negative for cough, shortness of breath, wheezing and stridor.    Cardiovascular: Negative for chest pain, palpitations and leg swelling.  Gastrointestinal: Negative for nausea, vomiting, abdominal pain, diarrhea, constipation, blood in stool, abdominal distention and anal bleeding.  Genitourinary: Negative for dysuria and flank pain.  Musculoskeletal: Negative for myalgias, arthralgias and gait problem.  Skin: Negative for color change and rash.  Neurological: Negative for dizziness and headaches.  Hematological: Negative for adenopathy. Does not bruise/bleed easily.  Psychiatric/Behavioral: Negative for suicidal ideas, sleep disturbance and dysphoric mood. The patient is not nervous/anxious.        Objective:   Physical Exam  Constitutional: She is oriented to person, place, and time. She appears well-developed and well-nourished. No distress.  HENT:  Head: Normocephalic and atraumatic.  Right Ear: External ear normal.  Left Ear: External ear normal.  Nose: Nose normal.  Mouth/Throat: Oropharynx is clear and moist. No oropharyngeal exudate.  Eyes: Conjunctivae are normal. Pupils are equal, round, and reactive to light. Right eye exhibits no discharge. Left eye exhibits no discharge. No scleral icterus.  Neck: Normal range of motion. Neck supple. No tracheal deviation present. No thyromegaly present.  Cardiovascular: Normal rate, regular rhythm, normal heart sounds and intact distal pulses.  Exam reveals no gallop and no friction rub.   No murmur heard. Pulmonary/Chest: Effort normal and breath sounds normal. No accessory muscle usage. Not tachypneic. No respiratory distress. She has no decreased breath sounds. She has no wheezes. She has no rhonchi. She has no rales. She exhibits no tenderness. Right breast exhibits no inverted nipple, no mass, no nipple discharge, no skin change and no tenderness. Left breast exhibits no inverted nipple, no mass, no nipple discharge, no skin change and no tenderness. Breasts are symmetrical.  Abdominal: Soft. Bowel  sounds are normal. She exhibits no distension and no mass. There is no tenderness. There is no rebound and no guarding.  Musculoskeletal: Normal range of motion. She exhibits no edema and no tenderness.  Lymphadenopathy:    She has no cervical adenopathy.  Neurological: She is alert and oriented to person, place, and time. No cranial nerve deficit. She exhibits normal muscle tone. Coordination normal.  Skin: Skin is warm and dry. No rash noted. She is not diaphoretic. No erythema. No pallor.  Psychiatric: She has a normal mood and affect. Her behavior is normal. Judgment and thought content normal.          Assessment & Plan:

## 2012-12-12 NOTE — Assessment & Plan Note (Signed)
BP Readings from Last 3 Encounters:  12/12/12 154/88  09/23/12 168/70  04/29/12 150/90   And blood pressure has generally been well-controlled at home on current medications. Will continue to monitor. Patient will call if consistently greater than 150/90.

## 2012-12-14 ENCOUNTER — Telehealth: Payer: Self-pay | Admitting: Internal Medicine

## 2012-12-14 NOTE — Telephone Encounter (Signed)
US carotid normal 4/21

## 2013-01-02 ENCOUNTER — Encounter: Payer: Self-pay | Admitting: Internal Medicine

## 2013-01-18 ENCOUNTER — Encounter: Payer: Self-pay | Admitting: Internal Medicine

## 2013-04-16 ENCOUNTER — Other Ambulatory Visit: Payer: Self-pay | Admitting: Internal Medicine

## 2013-04-17 NOTE — Telephone Encounter (Signed)
Last OV 4/14

## 2013-04-17 NOTE — Telephone Encounter (Signed)
Rx faxed to pharmacy  

## 2013-04-27 ENCOUNTER — Telehealth: Payer: Self-pay | Admitting: *Deleted

## 2013-04-27 NOTE — Telephone Encounter (Signed)
Patient left message on voicemail stating she would like to change her Metoprolol to 25 mg instead of 50 mg. The 50 mg are too strong for her

## 2013-04-27 NOTE — Telephone Encounter (Signed)
We should bring her in to check BP first.

## 2013-04-28 NOTE — Telephone Encounter (Signed)
Patient informed, she stated she would just keep using the 50 mg. She will just cut them in half like she being doing and wait until she comes in for her appointment on 10/24 with Dr. Dan Humphreys.

## 2013-06-15 ENCOUNTER — Encounter: Payer: Self-pay | Admitting: *Deleted

## 2013-06-16 ENCOUNTER — Encounter: Payer: Self-pay | Admitting: Internal Medicine

## 2013-06-16 ENCOUNTER — Ambulatory Visit (INDEPENDENT_AMBULATORY_CARE_PROVIDER_SITE_OTHER): Payer: Medicare Other | Admitting: Internal Medicine

## 2013-06-16 VITALS — BP 158/84 | HR 99 | Temp 98.5°F | Wt 128.0 lb

## 2013-06-16 DIAGNOSIS — I10 Essential (primary) hypertension: Secondary | ICD-10-CM

## 2013-06-16 DIAGNOSIS — E785 Hyperlipidemia, unspecified: Secondary | ICD-10-CM

## 2013-06-16 LAB — COMPREHENSIVE METABOLIC PANEL
Alkaline Phosphatase: 78 U/L (ref 39–117)
BUN: 15 mg/dL (ref 6–23)
CO2: 26 mEq/L (ref 19–32)
GFR: 85.01 mL/min (ref >60.00)
Glucose, Bld: 68 mg/dL — ABNORMAL LOW (ref 70–99)
Total Bilirubin: 0.4 mg/dL (ref 0.3–1.2)

## 2013-06-16 NOTE — Assessment & Plan Note (Signed)
Lipids well-controlled on atorvastatin. Will recheck LFTs with labs today.

## 2013-06-16 NOTE — Progress Notes (Signed)
Subjective:    Patient ID: Melissa Turner, female    DOB: 1941-03-09, 72 y.o.   MRN: [id number]  HPI 72 year old female with history of hyperlipidemia and hypertension presents for followup. She reports she is feeling well. She is compliant with medication however did not take her metoprolol this morning before her appointment. When blood pressures checked at home it is generally less than 130/80. She denies any headache, palpitations, chest pain. She is physically active and follows a healthy diet.  Outpatient Encounter Prescriptions as of 06/16/2013  Medication Sig Dispense Refill  . ALPRAZolam (XANAX) 0.25 MG tablet TAKE 1 TABLET BY MOUTH EVERY DAY  30 tablet  3  . aspirin 325 MG tablet Take 325 mg by mouth daily.      Marland Kitchen atorvastatin (LIPITOR) 10 MG tablet Take 1 tablet (10 mg total) by mouth daily.  30 tablet  3  . Magnesium 500 MG TABS Take 250 mg by mouth every other day.      . metoprolol succinate (TOPROL-XL) 50 MG 24 hr tablet Take 1 tablet (50 mg total) by mouth daily.  90 tablet  1   No facility-administered encounter medications on file as of 06/16/2013.   BP 158/84  Pulse 99  Temp(Src) 98.5 F (36.9 C) (Oral)  Wt 128 lb (58.06 kg)  BMI 25.43 kg/m2  SpO2 98%  Review of Systems  Constitutional: Negative for fever, chills, appetite change, fatigue and unexpected weight change.  HENT: Negative for congestion, ear pain, sinus pressure, sore throat, trouble swallowing and voice change.   Eyes: Negative for visual disturbance.  Respiratory: Negative for cough, shortness of breath, wheezing and stridor.   Cardiovascular: Negative for chest pain, palpitations and leg swelling.  Gastrointestinal: Negative for nausea, vomiting, abdominal pain, diarrhea, constipation, blood in stool, abdominal distention and anal bleeding.  Genitourinary: Negative for dysuria and flank pain.  Musculoskeletal: Negative for arthralgias, gait problem, myalgias and neck pain.  Skin: Negative for color  change and rash.  Neurological: Negative for dizziness and headaches.  Hematological: Negative for adenopathy. Does not bruise/bleed easily.  Psychiatric/Behavioral: Negative for suicidal ideas, sleep disturbance and dysphoric mood. The patient is not nervous/anxious.        Objective:   Physical Exam  Constitutional: She is oriented to person, place, and time. She appears well-developed and well-nourished. No distress.  HENT:  Head: Normocephalic and atraumatic.  Right Ear: External ear normal.  Left Ear: External ear normal.  Nose: Nose normal.  Mouth/Throat: Oropharynx is clear and moist. No oropharyngeal exudate.  Eyes: Conjunctivae are normal. Pupils are equal, round, and reactive to light. Right eye exhibits no discharge. Left eye exhibits no discharge. No scleral icterus.  Neck: Normal range of motion. Neck supple. No tracheal deviation present. No thyromegaly present.  Cardiovascular: Normal rate, regular rhythm, normal heart sounds and intact distal pulses.  Exam reveals no gallop and no friction rub.   No murmur heard. Pulmonary/Chest: Effort normal and breath sounds normal. No accessory muscle usage. Not tachypneic. No respiratory distress. She has no decreased breath sounds. She has no wheezes. She has no rhonchi. She has no rales. She exhibits no tenderness.  Musculoskeletal: Normal range of motion. She exhibits no edema and no tenderness.  Lymphadenopathy:    She has no cervical adenopathy.  Neurological: She is alert and oriented to person, place, and time. No cranial nerve deficit. She exhibits normal muscle tone. Coordination normal.  Skin: Skin is warm and dry. No rash noted. She is not diaphoretic. No  erythema. No pallor.  Psychiatric: She has a normal mood and affect. Her behavior is normal. Judgment and thought content normal.          Assessment & Plan:

## 2013-06-16 NOTE — Assessment & Plan Note (Signed)
BP Readings from Last 3 Encounters:  06/16/13 158/84  12/12/12 154/88  09/23/12 168/70   Blood pressure generally well-controlled with metoprolol. Blood pressure noted to be slightly elevated today however patient did not take her medication this morning. Encouraged her to monitor blood pressure at home. She will call if blood pressure consistently greater than 140/90.

## 2013-08-08 ENCOUNTER — Other Ambulatory Visit: Payer: Self-pay | Admitting: Internal Medicine

## 2013-08-20 ENCOUNTER — Other Ambulatory Visit: Payer: Self-pay | Admitting: Internal Medicine

## 2013-12-15 ENCOUNTER — Ambulatory Visit (INDEPENDENT_AMBULATORY_CARE_PROVIDER_SITE_OTHER): Payer: Commercial Managed Care - HMO | Admitting: Internal Medicine

## 2013-12-15 ENCOUNTER — Encounter: Payer: Self-pay | Admitting: Internal Medicine

## 2013-12-15 VITALS — BP 150/82 | HR 89 | Temp 98.2°F | Ht 59.75 in | Wt 127.0 lb

## 2013-12-15 DIAGNOSIS — Z23 Encounter for immunization: Secondary | ICD-10-CM

## 2013-12-15 DIAGNOSIS — Z0000 Encounter for general adult medical examination without abnormal findings: Secondary | ICD-10-CM

## 2013-12-15 DIAGNOSIS — E210 Primary hyperparathyroidism: Secondary | ICD-10-CM

## 2013-12-15 DIAGNOSIS — Z1239 Encounter for other screening for malignant neoplasm of breast: Secondary | ICD-10-CM

## 2013-12-15 LAB — COMPREHENSIVE METABOLIC PANEL
ALK PHOS: 81 U/L (ref 39–117)
ALT: 39 U/L — ABNORMAL HIGH (ref 0–35)
AST: 30 U/L (ref 0–37)
Albumin: 4.5 g/dL (ref 3.5–5.2)
BILIRUBIN TOTAL: 0.9 mg/dL (ref 0.3–1.2)
BUN: 14 mg/dL (ref 6–23)
CO2: 25 meq/L (ref 19–32)
CREATININE: 0.7 mg/dL (ref 0.4–1.2)
Calcium: 10.8 mg/dL — ABNORMAL HIGH (ref 8.4–10.5)
Chloride: 104 mEq/L (ref 96–112)
GFR: 84.89 mL/min (ref >60.00)
Glucose, Bld: 77 mg/dL (ref 70–99)
Potassium: 4.2 mEq/L (ref 3.5–5.1)
SODIUM: 137 meq/L (ref 135–145)
Total Protein: 7.1 g/dL (ref 6.0–8.3)

## 2013-12-15 LAB — CBC WITH DIFFERENTIAL/PLATELET
Basophils Absolute: 0 10*3/uL (ref 0.0–0.1)
Basophils Relative: 0.6 % (ref 0.0–3.0)
EOS PCT: 3.3 % (ref 0.0–5.0)
Eosinophils Absolute: 0.2 10*3/uL (ref 0.0–0.7)
HEMATOCRIT: 44.2 % (ref 36.0–46.0)
HEMOGLOBIN: 15.1 g/dL — AB (ref 12.0–15.0)
Lymphocytes Relative: 29.2 % (ref 12.0–46.0)
Lymphs Abs: 1.9 10*3/uL (ref 0.7–4.0)
MCHC: 34.1 g/dL (ref 30.0–36.0)
MCV: 95.5 fl (ref 78.0–100.0)
MONO ABS: 0.6 10*3/uL (ref 0.1–1.0)
MONOS PCT: 8.6 % (ref 3.0–12.0)
NEUTROS ABS: 3.8 10*3/uL (ref 1.4–7.7)
Neutrophils Relative %: 58.3 % (ref 43.0–77.0)
PLATELETS: 283 10*3/uL (ref 150.0–400.0)
RBC: 4.63 Mil/uL (ref 3.87–5.11)
RDW: 12.8 % (ref 11.5–14.6)
WBC: 6.5 10*3/uL (ref 4.5–10.5)

## 2013-12-15 LAB — LIPID PANEL
CHOLESTEROL: 193 mg/dL (ref 0–200)
HDL: 32.1 mg/dL — AB (ref >39.00)
LDL CALC: 115 mg/dL — AB (ref 0–99)
TRIGLYCERIDES: 229 mg/dL — AB (ref 0.0–149.0)
Total CHOL/HDL Ratio: 6
VLDL: 45.8 mg/dL — ABNORMAL HIGH (ref 0.0–40.0)

## 2013-12-15 LAB — MICROALBUMIN / CREATININE URINE RATIO
CREATININE, U: 29.4 mg/dL
Microalb Creat Ratio: 2 mg/g (ref 0.0–30.0)
Microalb, Ur: 0.6 mg/dL (ref 0.0–1.9)

## 2013-12-15 NOTE — Progress Notes (Signed)
Subjective:    Patient ID: Melissa Turner, female    DOB: 05-Mar-1941, 73 y.o.   MRN: [id number]  HPI The roster of all physicians providing medical care to patient - is listed in the Snapshot section of the chart.  Activities of daily living:  The patient is 100% independent in all ADLs: dressing, toileting, feeding as well as independent mobility. Lives in [place] with husband and 2 grandchildren. Pets, dogs. Two story home.  Home safety : The patient has smoke detectors in the home. They wear seatbelts.  There are no firearms at home. There is no violence in the home.   There is no risks for hepatitis, STDs or HIV. There is a history of blood transfusion with childbirth in 1967. They have no travel history to infectious disease endemic areas of the world.  The patient has seen their dentist in the last six month. (Dr. Hilliard Clark) They have seen their eye doctor in the last year. [facility] -Dr. Tobe Sos) No issues with hearing.  They have deferred audiologic testing in the last year.  Testing in past was normal. do not  have excessive sun exposure. Discussed the need for sun protection: hats, long sleeves and use of sunscreen if there is significant sun exposure. (Dr. Nehemiah Massed, [facility])  Diet: the importance of a healthy diet is discussed. They do have a healthy diet.  The benefits of regular aerobic exercise were discussed. She is very active and exercises regularly.  Depression screen: there are no signs or vegative symptoms of depression- irritability, change in appetite, anhedonia, sadness/tearfullness.  Cognitive assessment: the patient manages all their financial and personal affairs and is actively engaged. They could relate day,date,year and events.  The following portions of the patient's history were reviewed and updated as appropriate: allergies, current medications, past family history, past medical history,  past surgical history, past social history  and problem  list.  Visual acuity was not assessed per patient preference since she has regular follow up with her ophthalmologist. Hearing and body mass index were assessed and reviewed.   During the course of the visit the patient was educated and counseled about appropriate screening and preventive services including : fall prevention , diabetes screening, nutrition counseling, colorectal cancer screening, and recommended immunizations.    Review of Systems  Constitutional: Negative for fever, chills, appetite change, fatigue and unexpected weight change.  HENT: Negative for congestion, ear pain, sinus pressure, sore throat, trouble swallowing and voice change.   Eyes: Negative for visual disturbance.  Respiratory: Negative for cough, shortness of breath, wheezing and stridor.   Cardiovascular: Negative for chest pain, palpitations and leg swelling.  Gastrointestinal: Negative for nausea, vomiting, abdominal pain, diarrhea, constipation, blood in stool, abdominal distention and anal bleeding.  Genitourinary: Negative for dysuria and flank pain.  Musculoskeletal: Negative for arthralgias, gait problem, myalgias and neck pain.  Skin: Negative for color change and rash.  Neurological: Negative for dizziness and headaches.  Hematological: Negative for adenopathy. Does not bruise/bleed easily.  Psychiatric/Behavioral: Negative for suicidal ideas, sleep disturbance and dysphoric mood. The patient is not nervous/anxious.    BP 150/82  Pulse 89  Temp(Src) 98.2 F (36.8 C) (Oral)  Ht 4' 11.75" (1.518 m)  Wt 127 lb (57.607 kg)  BMI 25.00 kg/m2  SpO2 96%     Objective:   Physical Exam  Constitutional: She is oriented to person, place, and time. She appears well-developed and well-nourished. No distress.  HENT:  Head: Normocephalic and atraumatic.  Right Ear:  External ear normal.  Left Ear: External ear normal.  Nose: Nose normal.  Mouth/Throat: Oropharynx is clear and moist. No oropharyngeal  exudate.  Eyes: Conjunctivae are normal. Pupils are equal, round, and reactive to light. Right eye exhibits no discharge. Left eye exhibits no discharge. No scleral icterus.  Neck: Normal range of motion. Neck supple. No tracheal deviation present. No thyromegaly present.  Cardiovascular: Normal rate, regular rhythm, normal heart sounds and intact distal pulses.  Exam reveals no gallop and no friction rub.   No murmur heard. Pulmonary/Chest: Effort normal and breath sounds normal. No accessory muscle usage. Not tachypneic. No respiratory distress. She has no decreased breath sounds. She has no wheezes. She has no rales. She exhibits no tenderness. Right breast exhibits no inverted nipple, no mass, no nipple discharge, no skin change and no tenderness. Left breast exhibits no inverted nipple, no mass, no nipple discharge, no skin change and no tenderness. Breasts are symmetrical.  Abdominal: Soft. Bowel sounds are normal. She exhibits no distension and no mass. There is no tenderness. There is no rebound and no guarding.  Musculoskeletal: Normal range of motion. She exhibits no edema and no tenderness.  Lymphadenopathy:    She has no cervical adenopathy.  Neurological: She is alert and oriented to person, place, and time. No cranial nerve deficit. She exhibits normal muscle tone. Coordination normal.  Skin: Skin is warm and dry. No rash noted. She is not diaphoretic. No erythema. No pallor.  Psychiatric: She has a normal mood and affect. Her behavior is normal. Judgment and thought content normal.          Assessment & Plan:

## 2013-12-15 NOTE — Progress Notes (Signed)
Pre visit review using our clinic review tool, if applicable. No additional management support is needed unless otherwise documented below in the visit note. 

## 2013-12-15 NOTE — Assessment & Plan Note (Signed)
General medical exam including breast exam normal today. PAP and pelvic deferred given pt age and preference. Prevnar today. Labs including CBC, CMP, lipids. Encouraged healthy diet and exercise.

## 2013-12-15 NOTE — Addendum Note (Signed)
Addended by: Ronaldo Miyamoto on: 12/15/2013 09:51 AM   Modules accepted: Orders

## 2013-12-16 LAB — VITAMIN D 25 HYDROXY (VIT D DEFICIENCY, FRACTURES): Vit D, 25-Hydroxy: 21 ng/mL — ABNORMAL LOW (ref 30–89)

## 2014-01-12 ENCOUNTER — Encounter: Payer: Self-pay | Admitting: *Deleted

## 2014-01-30 ENCOUNTER — Encounter: Payer: Self-pay | Admitting: Internal Medicine

## 2014-03-11 ENCOUNTER — Encounter: Payer: Self-pay | Admitting: Internal Medicine

## 2014-03-21 ENCOUNTER — Telehealth: Payer: Self-pay

## 2014-03-21 NOTE — Telephone Encounter (Signed)
The patient's ref to [facility] has been approved through [facility] # [id number] (authorized for 6 visits)

## 2014-04-25 ENCOUNTER — Other Ambulatory Visit: Payer: Self-pay | Admitting: Internal Medicine

## 2014-04-25 NOTE — Telephone Encounter (Signed)
Last refill 12.19.14, last OV  4.24.15, next OV 10.28.15.  Please advise refill

## 2014-04-26 NOTE — Telephone Encounter (Signed)
Rx faxed to pharmacy  

## 2014-06-07 ENCOUNTER — Other Ambulatory Visit: Payer: Self-pay | Admitting: Internal Medicine

## 2014-06-20 ENCOUNTER — Ambulatory Visit: Payer: Commercial Managed Care - HMO | Admitting: Internal Medicine

## 2014-06-26 ENCOUNTER — Ambulatory Visit: Payer: Commercial Managed Care - HMO | Admitting: Internal Medicine

## 2014-06-27 ENCOUNTER — Ambulatory Visit (INDEPENDENT_AMBULATORY_CARE_PROVIDER_SITE_OTHER): Payer: Commercial Managed Care - HMO | Admitting: Internal Medicine

## 2014-06-27 ENCOUNTER — Encounter: Payer: Self-pay | Admitting: Internal Medicine

## 2014-06-27 VITALS — BP 150/80 | HR 77 | Temp 98.6°F | Ht 59.75 in | Wt 128.2 lb

## 2014-06-27 DIAGNOSIS — M25562 Pain in left knee: Secondary | ICD-10-CM | POA: Insufficient documentation

## 2014-06-27 DIAGNOSIS — E210 Primary hyperparathyroidism: Secondary | ICD-10-CM

## 2014-06-27 DIAGNOSIS — M79605 Pain in left leg: Secondary | ICD-10-CM

## 2014-06-27 DIAGNOSIS — M25561 Pain in right knee: Secondary | ICD-10-CM | POA: Insufficient documentation

## 2014-06-27 DIAGNOSIS — I10 Essential (primary) hypertension: Secondary | ICD-10-CM

## 2014-06-27 MED ORDER — ALPRAZOLAM 0.25 MG PO TABS: ORAL_TABLET | ORAL | Status: DC

## 2014-06-27 NOTE — Assessment & Plan Note (Signed)
BP Readings from Last 3 Encounters:  06/27/14 150/80  12/15/13 150/82  06/16/13 158/84   BP elevated today. Pt is reluctant to increase dose of Metoprolol because of concerns about fatigue. Will monitor BP for now. Recent renal function at [facility] reviewed, normal. She will email with BP readings.

## 2014-06-27 NOTE — Assessment & Plan Note (Signed)
Likely tear of left hamstring after injury while walking. Discussed possible referral to Sports Medicine. Recommended prn Ibuprofen and icing area for now. She will follow up by email if symptoms are not improving.

## 2014-06-27 NOTE — Patient Instructions (Signed)
Please monitor blood pressure weekly and email with readings.  Follow up in 6 months or sooner as needed.

## 2014-06-27 NOTE — Assessment & Plan Note (Signed)
Recent Ca level was improved when checked at endocrinology. Will continue to monitor.

## 2014-06-27 NOTE — Progress Notes (Signed)
Pre visit review using our clinic review tool, if applicable. No additional management support is needed unless otherwise documented below in the visit note. 

## 2014-06-27 NOTE — Progress Notes (Signed)
Subjective:    Patient ID: Melissa Turner, female    DOB: 1941/02/19, 73 y.o.   MRN: [id number]  HPI 73YO female presents for follow up.  Hyperparathyroidism - Recent labs performed by endocrinology showed Ca 10.3 and PTH of 29.  Sunday, caught shoe on floor and pulled left hamstring. Has pain in posterior left upper leg with walking. Has been icing area. Pain seems to be improving.  HTN - Compliant with medication. BP at home generally 130-140/80-90s. No headache, chest pain, palpitations.   Review of Systems  Constitutional: Negative for fever, chills, appetite change, fatigue and unexpected weight change.  Eyes: Negative for visual disturbance.  Respiratory: Negative for shortness of breath.   Cardiovascular: Negative for chest pain, palpitations and leg swelling.  Gastrointestinal: Negative for abdominal pain.  Musculoskeletal: Positive for myalgias.  Skin: Negative for color change and rash.  Neurological: Negative for headaches.  Hematological: Negative for adenopathy. Does not bruise/bleed easily.  Psychiatric/Behavioral: Negative for dysphoric mood. The patient is not nervous/anxious.        Objective:    BP 150/80 mmHg  Pulse 77  Temp(Src) 98.6 F (37 C) (Oral)  Ht 4' 11.75" (1.518 m)  Wt 128 lb 4 oz (58.174 kg)  BMI 25.25 kg/m2  SpO2 95% Physical Exam  Constitutional: She is oriented to person, place, and time. She appears well-developed and well-nourished. No distress.  HENT:  Head: Normocephalic and atraumatic.  Right Ear: External ear normal.  Left Ear: External ear normal.  Nose: Nose normal.  Mouth/Throat: Oropharynx is clear and moist. No oropharyngeal exudate.  Eyes: Conjunctivae are normal. Pupils are equal, round, and reactive to light. Right eye exhibits no discharge. Left eye exhibits no discharge. No scleral icterus.  Neck: Normal range of motion. Neck supple. No tracheal deviation present. No thyromegaly present.  Cardiovascular: Normal rate,  regular rhythm, normal heart sounds and intact distal pulses.  Exam reveals no gallop and no friction rub.   No murmur heard. Pulmonary/Chest: Effort normal and breath sounds normal. No accessory muscle usage. No tachypnea. No respiratory distress. She has no decreased breath sounds. She has no wheezes. She has no rhonchi. She has no rales. She exhibits no tenderness.  Musculoskeletal: Normal range of motion. She exhibits no edema.       Left upper leg: She exhibits tenderness. She exhibits no edema and no deformity.       Legs: Lymphadenopathy:    She has no cervical adenopathy.  Neurological: She is alert and oriented to person, place, and time. No cranial nerve deficit. She exhibits normal muscle tone. Coordination normal.  Skin: Skin is warm and dry. No rash noted. She is not diaphoretic. No erythema. No pallor.  Psychiatric: She has a normal mood and affect. Her behavior is normal. Judgment and thought content normal.          Assessment & Plan:   Problem List Items Addressed This Visit      Unprioritized   Hypertension - Primary    BP Readings from Last 3 Encounters:  06/27/14 150/80  12/15/13 150/82  06/16/13 158/84   BP elevated today. Pt is reluctant to increase dose of Metoprolol because of concerns about fatigue. Will monitor BP for now. Recent renal function at [facility] reviewed, normal. She will email with BP readings.    Left leg pain    Likely tear of left hamstring after injury while walking. Discussed possible referral to Sports Medicine. Recommended prn Ibuprofen and icing area for now.  She will follow up by email if symptoms are not improving.    Primary hyperparathyroidism    Recent Ca level was improved when checked at endocrinology. Will continue to monitor.        Return in about 6 months (around 12/26/2014) for Wellness Visit.

## 2014-06-28 ENCOUNTER — Telehealth: Payer: Self-pay | Admitting: Internal Medicine

## 2014-06-28 NOTE — Telephone Encounter (Signed)
emmi emailed °

## 2014-09-06 ENCOUNTER — Other Ambulatory Visit: Payer: Self-pay | Admitting: Internal Medicine

## 2014-10-05 ENCOUNTER — Telehealth: Payer: Self-pay | Admitting: Internal Medicine

## 2014-10-05 NOTE — Telephone Encounter (Signed)
Pt request for a referral to sports medicine for pull left sided hamstring. Please advise pt/msn

## 2014-10-05 NOTE — Telephone Encounter (Signed)
Needs to be evaluated in a visit first.

## 2014-10-17 ENCOUNTER — Other Ambulatory Visit: Payer: Self-pay

## 2014-10-17 MED ORDER — ATORVASTATIN CALCIUM 10 MG PO TABS: ORAL_TABLET | ORAL | Status: DC

## 2014-10-17 NOTE — Telephone Encounter (Signed)
[facility] request 90 day supply.

## 2014-11-13 ENCOUNTER — Ambulatory Visit: Payer: Medicare Other | Admitting: Internal Medicine

## 2014-11-29 ENCOUNTER — Ambulatory Visit: Payer: Commercial Managed Care - HMO | Admitting: Internal Medicine

## 2014-12-27 ENCOUNTER — Encounter: Payer: Medicare Other | Admitting: Internal Medicine

## 2015-01-02 ENCOUNTER — Encounter: Payer: Self-pay | Admitting: Internal Medicine

## 2015-01-02 ENCOUNTER — Ambulatory Visit (INDEPENDENT_AMBULATORY_CARE_PROVIDER_SITE_OTHER): Payer: PPO | Admitting: Internal Medicine

## 2015-01-02 VITALS — BP 137/73 | HR 76 | Temp 98.1°F | Ht 59.5 in | Wt 126.1 lb

## 2015-01-02 DIAGNOSIS — Z0000 Encounter for general adult medical examination without abnormal findings: Secondary | ICD-10-CM

## 2015-01-02 DIAGNOSIS — M79605 Pain in left leg: Secondary | ICD-10-CM

## 2015-01-02 LAB — LIPID PANEL
CHOL/HDL RATIO: 5
CHOLESTEROL: 184 mg/dL (ref 0–200)
HDL: 36.4 mg/dL — AB (ref >39.00)
LDL CALC: 113 mg/dL — AB (ref 0–99)
NonHDL: 147.6
Triglycerides: 172 mg/dL — ABNORMAL HIGH (ref 0.0–149.0)
VLDL: 34.4 mg/dL (ref 0.0–40.0)

## 2015-01-02 LAB — MICROALBUMIN / CREATININE URINE RATIO
Creatinine,U: 42.6 mg/dL
Microalb Creat Ratio: 1.6 mg/g (ref 0.0–30.0)
Microalb, Ur: <0.7 mg/dL (ref 0.0–1.9)

## 2015-01-02 LAB — CBC WITH DIFFERENTIAL/PLATELET
BASOS PCT: 0.7 % (ref 0.0–3.0)
Basophils Absolute: 0 10*3/uL (ref 0.0–0.1)
EOS ABS: 0.2 10*3/uL (ref 0.0–0.7)
Eosinophils Relative: 3.3 % (ref 0.0–5.0)
HEMATOCRIT: 42.6 % (ref 36.0–46.0)
HEMOGLOBIN: 14.8 g/dL (ref 12.0–15.0)
LYMPHS PCT: 27.8 % (ref 12.0–46.0)
Lymphs Abs: 1.6 10*3/uL (ref 0.7–4.0)
MCHC: 34.7 g/dL (ref 30.0–36.0)
MCV: 93.5 fl (ref 78.0–100.0)
Monocytes Absolute: 0.5 10*3/uL (ref 0.1–1.0)
Monocytes Relative: 8.4 % (ref 3.0–12.0)
NEUTROS PCT: 59.8 % (ref 43.0–77.0)
Neutro Abs: 3.5 10*3/uL (ref 1.4–7.7)
Platelets: 268 10*3/uL (ref 150.0–400.0)
RBC: 4.55 Mil/uL (ref 3.87–5.11)
RDW: 12.4 % (ref 11.5–15.5)
WBC: 5.9 10*3/uL (ref 4.0–10.5)

## 2015-01-02 LAB — COMPREHENSIVE METABOLIC PANEL
ALK PHOS: 84 U/L (ref 39–117)
ALT: 25 U/L (ref 0–35)
AST: 23 U/L (ref 0–37)
Albumin: 4.3 g/dL (ref 3.5–5.2)
BILIRUBIN TOTAL: 0.6 mg/dL (ref 0.2–1.2)
BUN: 15 mg/dL (ref 6–23)
CO2: 27 mEq/L (ref 19–32)
CREATININE: 0.64 mg/dL (ref 0.40–1.20)
Calcium: 10.3 mg/dL (ref 8.4–10.5)
Chloride: 104 mEq/L (ref 96–112)
GFR: 96.96 mL/min (ref >60.00)
Glucose, Bld: 103 mg/dL — ABNORMAL HIGH (ref 70–99)
Potassium: 4.1 mEq/L (ref 3.5–5.1)
SODIUM: 136 meq/L (ref 135–145)
Total Protein: 7 g/dL (ref 6.0–8.3)

## 2015-01-02 LAB — VITAMIN D 25 HYDROXY (VIT D DEFICIENCY, FRACTURES): VITD: 17.44 ng/mL — AB (ref 30.00–100.00)

## 2015-01-02 LAB — TSH: TSH: 1.69 u[IU]/mL (ref 0.35–4.50)

## 2015-01-02 NOTE — Patient Instructions (Signed)

## 2015-01-02 NOTE — Assessment & Plan Note (Signed)
Left posterior leg pain with exercise. Will set up PT evaluation. Follow up prn.

## 2015-01-02 NOTE — Progress Notes (Signed)
Pre visit review using our clinic review tool, if applicable. No additional management support is needed unless otherwise documented below in the visit note. 

## 2015-01-02 NOTE — Progress Notes (Signed)
Subjective:    Patient ID: Melissa Turner, female    DOB: 1940-12-08, 74 y.o.   MRN: [id number]  HPI  The roster of all physicians providing medical care to patient - is listed in the Snapshot section of the chart.  Activities of daily living:  The patient is 100% independent in all ADLs: dressing, toileting, feeding as well as independent mobility. Lives in [place] with husband and 2 grandchildren. Pets, dogs. Two story home.  Home safety : The patient has smoke detectors in the home. They wear seatbelts.  There are no firearms at home. There is no violence in the home.   There is no risks for hepatitis, STDs or HIV. There is a history of blood transfusion with childbirth in 1967. They have no travel history to infectious disease endemic areas of the world.  The patient has seen their dentist in the last six month. (Dr. Hilliard Clark) They have seen their eye doctor in the last year. [facility] -Dr. Tobe Sos) No issues with hearing.  They have deferred audiologic testing in the last year.  Testing in past was normal. do not  have excessive sun exposure. Discussed the need for sun protection: hats, long sleeves and use of sunscreen if there is significant sun exposure. (Dr. Nehemiah Massed, [facility])  Diet: the importance of a healthy diet is discussed. They do have a healthy diet.  The benefits of regular aerobic exercise were discussed. She is very active and exercises regularly.  Depression screen: there are no signs or vegative symptoms of depression- irritability, change in appetite, anhedonia, sadness/tearfullness.  Cognitive assessment: the patient manages all their financial and personal affairs and is actively engaged. They could relate day,date,year and events.  The following portions of the patient's history were reviewed and updated as appropriate: allergies, current medications, past family history, past medical history,  past surgical history, past social history  and problem  list.  Visual acuity was not assessed per patient preference since she has regular follow up with her ophthalmologist. Hearing and body mass index were assessed and reviewed.   During the course of the visit the patient was educated and counseled about appropriate screening and preventive services including : fall prevention , diabetes screening, nutrition counseling, colorectal cancer screening, and recommended immunizations.   LEFT LEG PAIN - Having instability and pain in left posterior leg ongoing for > 1 year. Described as cramping in hamstring. No falls. Not taking anything for pain.  Past medical, surgical, family and social history per today's encounter.  Review of Systems  Constitutional: Negative for fever, chills, appetite change, fatigue and unexpected weight change.  Eyes: Negative for visual disturbance.  Respiratory: Negative for shortness of breath.   Cardiovascular: Negative for chest pain and leg swelling.  Gastrointestinal: Negative for nausea, vomiting, abdominal pain, diarrhea and constipation.  Musculoskeletal: Negative for myalgias and arthralgias.  Skin: Negative for color change and rash.  Hematological: Negative for adenopathy. Does not bruise/bleed easily.  Psychiatric/Behavioral: Negative for sleep disturbance and dysphoric mood. The patient is not nervous/anxious.        Objective:    BP 137/73 mmHg  Pulse 76  Temp(Src) 98.1 F (36.7 C) (Oral)  Ht 4' 11.5" (1.511 m)  Wt 126 lb 2 oz (57.21 kg)  BMI 25.06 kg/m2  SpO2 98% Physical Exam  Constitutional: She is oriented to person, place, and time. She appears well-developed and well-nourished. No distress.  HENT:  Head: Normocephalic and atraumatic.  Right Ear: External ear normal.  Left Ear: External ear normal.  Nose: Nose normal.  Mouth/Throat: Oropharynx is clear and moist. No oropharyngeal exudate.  Eyes: Conjunctivae are normal. Pupils are equal, round, and reactive to light. Right eye exhibits  no discharge. Left eye exhibits no discharge. No scleral icterus.  Neck: Normal range of motion. Neck supple. No tracheal deviation present. No thyromegaly present.  Cardiovascular: Normal rate, regular rhythm, normal heart sounds and intact distal pulses.  Exam reveals no gallop and no friction rub.   No murmur heard. Pulmonary/Chest: Effort normal and breath sounds normal. No accessory muscle usage. No tachypnea. No respiratory distress. She has no decreased breath sounds. She has no wheezes. She has no rales. She exhibits no tenderness. Right breast exhibits no inverted nipple, no mass, no nipple discharge, no skin change and no tenderness. Left breast exhibits no inverted nipple, no mass, no nipple discharge, no skin change and no tenderness. Breasts are symmetrical.  Abdominal: Soft. Bowel sounds are normal. She exhibits no distension and no mass. There is no tenderness. There is no rebound and no guarding.  Musculoskeletal: Normal range of motion. She exhibits no edema or tenderness.  Lymphadenopathy:    She has no cervical adenopathy.  Neurological: She is alert and oriented to person, place, and time. No cranial nerve deficit. She exhibits normal muscle tone. Coordination normal.  Skin: Skin is warm and dry. No rash noted. She is not diaphoretic. No erythema. No pallor.  Psychiatric: She has a normal mood and affect. Her behavior is normal. Judgment and thought content normal.          Assessment & Plan:   Problem List Items Addressed This Visit      Unprioritized   Left leg pain    Left posterior leg pain with exercise. Will set up PT evaluation. Follow up prn.      Relevant Orders   Ambulatory referral to Physical Therapy   Medicare annual wellness visit, subsequent - Primary    General medical exam including breast exam normal today. PAP and pelvic deferred given pt age and preference. Prevnar today. Labs including CBC, CMP, lipids ordered. Encouraged healthy diet and  exercise.   Patient was given a handout regarding current recommendations for health maintenance and preventative care on the AVS.       Relevant Orders   TSH   CBC with Differential/Platelet   Comprehensive metabolic panel   Lipid panel   Vit D  25 hydroxy (rtn osteoporosis monitoring)   Microalbumin / creatinine urine ratio   MM Digital Screening   DG Bone Density       Return in about 6 months (around 07/05/2015) for Recheck.

## 2015-01-02 NOTE — Assessment & Plan Note (Signed)
General medical exam including breast exam normal today. PAP and pelvic deferred given pt age and preference. Prevnar today. Labs including CBC, CMP, lipids ordered. Encouraged healthy diet and exercise.   Patient was given a handout regarding current recommendations for health maintenance and preventative care on the AVS.

## 2015-01-11 ENCOUNTER — Telehealth: Payer: Self-pay | Admitting: *Deleted

## 2015-01-11 ENCOUNTER — Other Ambulatory Visit: Payer: Self-pay | Admitting: *Deleted

## 2015-01-11 MED ORDER — ALPRAZOLAM 0.25 MG PO TABS: ORAL_TABLET | ORAL | Status: DC

## 2015-01-11 NOTE — Telephone Encounter (Signed)
Fine to refill. Should be Alprazolam 0.25mg  dosing

## 2015-01-11 NOTE — Telephone Encounter (Signed)
Printed Rx.

## 2015-01-11 NOTE — Telephone Encounter (Signed)
Faxed Rx to pharmacy  

## 2015-01-11 NOTE — Telephone Encounter (Signed)
Pt called requesting Alprazolam 25 mg refill.  Last OV 5.11.16, last refill 11.4.15.  Please advise refill

## 2015-01-31 ENCOUNTER — Telehealth: Payer: Self-pay | Admitting: Internal Medicine

## 2015-01-31 NOTE — Telephone Encounter (Signed)
Received Mammogram from [facility] dated 6/8 showing possible asymmetry in the left upper breast. They have requested additional views. Has this been scheduled?

## 2015-02-04 NOTE — Telephone Encounter (Signed)
Per pt, Appt has not been made yet, pt states she will call to have that scheduled

## 2015-02-08 LAB — HM MAMMOGRAPHY

## 2015-04-16 ENCOUNTER — Other Ambulatory Visit: Payer: Self-pay | Admitting: Internal Medicine

## 2015-07-11 ENCOUNTER — Ambulatory Visit: Payer: Medicare Other | Admitting: Internal Medicine

## 2015-08-09 ENCOUNTER — Ambulatory Visit: Payer: PPO | Admitting: Internal Medicine

## 2015-09-05 ENCOUNTER — Encounter: Payer: Self-pay | Admitting: Internal Medicine

## 2015-09-05 ENCOUNTER — Ambulatory Visit (INDEPENDENT_AMBULATORY_CARE_PROVIDER_SITE_OTHER): Payer: PPO | Admitting: Internal Medicine

## 2015-09-05 VITALS — BP 135/81 | HR 82 | Temp 98.4°F | Ht 60.0 in | Wt 129.0 lb

## 2015-09-05 DIAGNOSIS — I10 Essential (primary) hypertension: Secondary | ICD-10-CM | POA: Diagnosis not present

## 2015-09-05 DIAGNOSIS — Z1159 Encounter for screening for other viral diseases: Secondary | ICD-10-CM | POA: Diagnosis not present

## 2015-09-05 DIAGNOSIS — E210 Primary hyperparathyroidism: Secondary | ICD-10-CM

## 2015-09-05 DIAGNOSIS — E785 Hyperlipidemia, unspecified: Secondary | ICD-10-CM | POA: Diagnosis not present

## 2015-09-05 DIAGNOSIS — Z23 Encounter for immunization: Secondary | ICD-10-CM | POA: Diagnosis not present

## 2015-09-05 LAB — COMPREHENSIVE METABOLIC PANEL
ALT: 22 U/L (ref 0–35)
AST: 19 U/L (ref 0–37)
Albumin: 4.6 g/dL (ref 3.5–5.2)
Alkaline Phosphatase: 82 U/L (ref 39–117)
BILIRUBIN TOTAL: 0.6 mg/dL (ref 0.2–1.2)
BUN: 16 mg/dL (ref 6–23)
CALCIUM: 10.4 mg/dL (ref 8.4–10.5)
CO2: 30 mEq/L (ref 19–32)
Chloride: 103 mEq/L (ref 96–112)
Creatinine, Ser: 0.67 mg/dL (ref 0.40–1.20)
GFR: 91.79 mL/min (ref >60.00)
GLUCOSE: 98 mg/dL (ref 70–99)
POTASSIUM: 4.2 meq/L (ref 3.5–5.1)
Sodium: 139 mEq/L (ref 135–145)
TOTAL PROTEIN: 6.6 g/dL (ref 6.0–8.3)

## 2015-09-05 NOTE — Assessment & Plan Note (Signed)
We discussed that she was born right before recommended cutoff for Hep C screening, however she reports potential exposure in the distant past, so will send Hep C IgG today.

## 2015-09-05 NOTE — Progress Notes (Signed)
Pre visit review using our clinic review tool, if applicable. No additional management support is needed unless otherwise documented below in the visit note. 

## 2015-09-05 NOTE — Assessment & Plan Note (Signed)
BP Readings from Last 3 Encounters:  09/05/15 135/81  01/02/15 137/73  06/27/14 150/80   BP well controlled. Renal function with labs. Continue Metoprolol.

## 2015-09-05 NOTE — Assessment & Plan Note (Signed)
Will recheck Ca and PTH with labs.

## 2015-09-05 NOTE — Patient Instructions (Signed)
Labs today.  Follow up in 6 months. 

## 2015-09-05 NOTE — Assessment & Plan Note (Signed)
Will check LFTs with labs. Continue Atorvastatin. Fasting lipid profile with physical exam in 6 months.

## 2015-09-05 NOTE — Progress Notes (Signed)
Subjective:    Patient ID: Melissa Turner, female    DOB: Dec 08, 1940, 75 y.o.   MRN: [id number]  HPI  75YO female presents for follow up.  Generally feeling well. Seen by Dr. Earnestine Leys for left knee pain. Wearing brace at times. Has instability of the patella.  HTN - Does not check BP at home. Compliant with medication. No CP, HA.  HL - Compliant with medication.  Followed by endocrine for primary hyperparathyroidism. She has not had labs in >6 months.  Wt Readings from Last 3 Encounters:  09/05/15 129 lb (58.514 kg)  01/02/15 126 lb 2 oz (57.21 kg)  06/27/14 128 lb 4 oz (58.174 kg)   BP Readings from Last 3 Encounters:  09/05/15 135/81  01/02/15 137/73  06/27/14 150/80    Past Medical History  Diagnosis Date  . Deviated septum     s/p rhinoplasty  . History of chicken pox   . GERD (gastroesophageal reflux disease)   . HTN (hypertension)   . Hypercholesteremia   . History of blood transfusion     during first child's birth  . Migraines    Family History  Problem Relation Age of Onset  . Heart disease Mother   . Diabetes Father   . Heart failure Father   . Dementia Father   . Cancer Other     Breast, liver, lung   Past Surgical History  Procedure Laterality Date  . Vaginal delivery      x2  . Tonsillectomy  1950  . Dilation and curettage of uterus    . Rhinoplasty    . Bunionectomy     Social History   Social History  . Marital Status: Married    Spouse Name: N/A  . Number of Children: 2  . Years of Education: N/A   Occupational History  . Medical Records [employer]    Social History Main Topics  . Smoking status: Never Smoker   . Smokeless tobacco: Never Used  . Alcohol Use: Yes     Comment: Wine 2 to 3 times a week  . Drug Use: No  . Sexual Activity: Not Asked   Other Topics Concern  . None   Social History Narrative   Lives with husband and 2 grandchildren. Works at [employer].      Regular Exercise -   Aerobic 30 min every day   Daily Caffeine Use:  1 cup sw tea daily             Review of Systems  Constitutional: Negative for fever, chills, appetite change, fatigue and unexpected weight change.  Eyes: Negative for visual disturbance.  Respiratory: Negative for cough and shortness of breath.   Cardiovascular: Negative for chest pain and leg swelling.  Gastrointestinal: Negative for nausea, vomiting, abdominal pain, diarrhea and constipation.  Musculoskeletal: Negative for myalgias and arthralgias.  Skin: Negative for color change and rash.  Hematological: Negative for adenopathy. Does not bruise/bleed easily.  Psychiatric/Behavioral: Negative for suicidal ideas, sleep disturbance and dysphoric mood. The patient is not nervous/anxious.        Objective:    BP 135/81 mmHg  Pulse 82  Temp(Src) 98.4 F (36.9 C) (Oral)  Ht 5' (1.524 m)  Wt 129 lb (58.514 kg)  BMI 25.19 kg/m2  SpO2 97% Physical Exam  Constitutional: She is oriented to person, place, and time. She appears well-developed and well-nourished. No distress.  HENT:  Head: Normocephalic and atraumatic.  Right Ear: External ear  normal.  Left Ear: External ear normal.  Nose: Nose normal.  Mouth/Throat: Oropharynx is clear and moist. No oropharyngeal exudate.  Eyes: Conjunctivae are normal. Pupils are equal, round, and reactive to light. Right eye exhibits no discharge. Left eye exhibits no discharge. No scleral icterus.  Neck: Normal range of motion. Neck supple. No tracheal deviation present. No thyromegaly present.  Cardiovascular: Normal rate, regular rhythm, normal heart sounds and intact distal pulses.  Exam reveals no gallop and no friction rub.   No murmur heard. Pulmonary/Chest: Effort normal and breath sounds normal. No respiratory distress. She has no wheezes. She has no rales. She exhibits no tenderness.  Musculoskeletal: Normal range of motion. She exhibits no edema or tenderness.  Lymphadenopathy:    She  has no cervical adenopathy.  Neurological: She is alert and oriented to person, place, and time. No cranial nerve deficit. She exhibits normal muscle tone. Coordination normal.  Skin: Skin is warm and dry. No rash noted. She is not diaphoretic. No erythema. No pallor.  Psychiatric: She has a normal mood and affect. Her behavior is normal. Judgment and thought content normal.          Assessment & Plan:   Problem List Items Addressed This Visit      Unprioritized   Hyperlipidemia    Will check LFTs with labs. Continue Atorvastatin. Fasting lipid profile with physical exam in 6 months.      Hypertension - Primary    BP Readings from Last 3 Encounters:  09/05/15 135/81  01/02/15 137/73  06/27/14 150/80   BP well controlled. Renal function with labs. Continue Metoprolol.      Relevant Orders   Comprehensive metabolic panel   Need for hepatitis C screening test    We discussed that she was born right before recommended cutoff for Hep C screening, however she reports potential exposure in the distant past, so will send Hep C IgG today.      Relevant Orders   Hepatitis C antibody   Primary hyperparathyroidism ([place])    Will recheck Ca and PTH with labs.      Relevant Orders   PTH, Intact and Calcium    Other Visit Diagnoses    Encounter for immunization            Return in about 6 months (around 03/04/2016) for Physical.

## 2015-09-06 LAB — HEPATITIS C ANTIBODY: HCV Ab: NEGATIVE

## 2015-09-10 LAB — PTH, INTACT AND CALCIUM

## 2015-09-11 ENCOUNTER — Other Ambulatory Visit (INDEPENDENT_AMBULATORY_CARE_PROVIDER_SITE_OTHER): Payer: PPO

## 2015-09-11 ENCOUNTER — Encounter: Payer: Self-pay | Admitting: *Deleted

## 2015-09-11 DIAGNOSIS — E210 Primary hyperparathyroidism: Secondary | ICD-10-CM | POA: Diagnosis not present

## 2015-09-12 LAB — PTH, INTACT AND CALCIUM
Calcium: 10.5 mg/dL (ref 8.4–10.5)
PTH: 51 pg/mL (ref 14–64)

## 2015-09-17 ENCOUNTER — Encounter: Payer: Self-pay | Admitting: Family Medicine

## 2015-09-17 ENCOUNTER — Ambulatory Visit (INDEPENDENT_AMBULATORY_CARE_PROVIDER_SITE_OTHER): Payer: PPO | Admitting: Family Medicine

## 2015-09-17 DIAGNOSIS — R1030 Lower abdominal pain, unspecified: Secondary | ICD-10-CM

## 2015-09-17 DIAGNOSIS — R102 Pelvic and perineal pain: Secondary | ICD-10-CM | POA: Insufficient documentation

## 2015-09-17 DIAGNOSIS — N390 Urinary tract infection, site not specified: Secondary | ICD-10-CM | POA: Diagnosis not present

## 2015-09-17 LAB — POC URINALSYSI DIPSTICK (AUTOMATED)
Bilirubin, UA: NEGATIVE
Glucose, UA: NEGATIVE
KETONES UA: NEGATIVE
Nitrite, UA: NEGATIVE
PH UA: 5.5
PROTEIN UA: NEGATIVE
SPEC GRAV UA: 1.025
Urobilinogen, UA: 0.2

## 2015-09-17 MED ORDER — CEPHALEXIN 500 MG PO CAPS: 500.0000 mg | ORAL_CAPSULE | Freq: Two times a day (BID) | ORAL | Status: DC

## 2015-09-17 NOTE — Patient Instructions (Signed)
Take the antibiotic as prescribed.  We will call with the culture results.  Let me know if you worsen or fail to improve.  Take care  Dr. Lacinda Axon

## 2015-09-17 NOTE — Progress Notes (Signed)
Pre visit review using our clinic review tool, if applicable. No additional management support is needed unless otherwise documented below in the visit note. 

## 2015-09-17 NOTE — Progress Notes (Signed)
Subjective:  Patient ID: Melissa Turner, female    DOB: August 17, 1941  Age: 75 y.o. MRN: [id number]  CC: ? UTI  HPI:  75 year old female presents to clinic today for an acute visit with concern for possible UTI.  Patient states that for the past 3 weeks her urine has been "hot". She denies any burning, frequency, urgency. No associated fevers or chills. No nausea or vomiting. This past weekend she developed suprapubic pain which she describes as a soreness. No known exacerbating or relieving factors. She is currently feeling improved. No medication/interventions tried.  Social Hx   Social History   Social History  . Marital Status: Married    Spouse Name: N/A  . Number of Children: 2  . Years of Education: N/A   Occupational History  . Medical Records [place]    Social History Main Topics  . Smoking status: Never Smoker   . Smokeless tobacco: Never Used  . Alcohol Use: Yes     Comment: Wine 2 to 3 times a week  . Drug Use: No  . Sexual Activity: Not Asked   Other Topics Concern  . None   Social History Narrative   Lives with husband and 2 grandchildren. Works at [employer].      Regular Exercise -  Aerobic 30 min every day   Daily Caffeine Use:  1 cup sw tea daily            Review of Systems  Constitutional: Negative for fever and chills.  Gastrointestinal: Positive for abdominal pain. Negative for nausea, vomiting and diarrhea.  Genitourinary: Negative for dysuria, urgency and frequency.   Objective:  BP 138/82 mmHg  Pulse 95  Temp(Src) 98.2 F (36.8 C) (Oral)  Ht 5' (1.524 m)  Wt 125 lb (56.7 kg)  BMI 24.41 kg/m2  SpO2 97%  BP/Weight 09/17/2015 09/05/2015 [id number]  Systolic BP 0000000 [id number]  Diastolic BP 82 81 73  Wt. (Lbs) 125 129 126.13  BMI 24.41 25.19 25.06   Physical Exam  Constitutional: She is oriented to person, place, and time. She appears well-developed. No distress.  Cardiovascular: Normal rate and regular rhythm.     Pulmonary/Chest: Effort normal and breath sounds normal. No respiratory distress. She has no wheezes. She has no rales.  Abdominal: Soft.  Suprapubic region to palpation. No rebound or guarding.  Neurological: She is alert and oriented to person, place, and time.  Psychiatric: She has a normal mood and affect.  Vitals reviewed.  Lab Results  Component Value Date   WBC 5.9 01/02/2015   HGB 14.8 01/02/2015   HCT 42.6 01/02/2015   PLT 268.0 01/02/2015   GLUCOSE 98 09/05/2015   CHOL 184 01/02/2015   TRIG 172.0* 01/02/2015   HDL 36.40* 01/02/2015   LDLDIRECT 100.6 04/29/2012   LDLCALC 113* 01/02/2015   ALT 22 09/05/2015   AST 19 09/05/2015   NA 139 09/05/2015   K 4.2 09/05/2015   CL 103 09/05/2015   CREATININE 0.67 09/05/2015   BUN 16 09/05/2015   CO2 30 09/05/2015   TSH 1.69 01/02/2015   MICROALBUR <0.7 01/02/2015   Results for orders placed or performed in visit on 09/17/15 (from the past 24 hour(s))  POCT Urinalysis Dipstick (Automated)     Status: Abnormal   Collection Time: 09/17/15  1:02 PM  Result Value Ref Range   Color, UA yellow    Clarity, UA clear    Glucose, UA neg    Bilirubin, UA  neg    Ketones, UA neg    Spec Grav, UA 1.025    Blood, UA small    pH, UA 5.5    Protein, UA neg    Urobilinogen, UA 0.2    Nitrite, UA neg    Leukocytes, UA Trace (A) Negative    Assessment & Plan:   Problem List Items Addressed This Visit    Suprapubic pain    New problem. Urinalysis with small blood and trace leukocytes. ? UTI. Sending for culture. Discussed watchful waiting versus empiric treatment while awaiting culture. Patient elected for empiric treatment. Rx for Keflex sent.      Relevant Medications   cephALEXin (KEFLEX) 500 MG capsule   Other Relevant Orders   POCT Urinalysis Dipstick (Automated) (Completed)   Urine culture      Meds ordered this encounter  Medications  . cephALEXin (KEFLEX) 500 MG capsule    Sig: Take 1 capsule (500 mg total) by  mouth 2 (two) times daily.    Dispense:  10 capsule    Refill:  0    Follow-up: PRN  [place]

## 2015-09-17 NOTE — Assessment & Plan Note (Addendum)
New problem. Mild tenderness on exam.  ? UTI with UA findings of small blood and trace leuks. No other symptoms of UTI (no urgency, dysuria, frequency) at this time. Sending for culture. Discussed watchful waiting versus empiric treatment while awaiting culture. Patient elected for empiric treatment. Rx for Keflex sent.

## 2015-09-19 LAB — URINE CULTURE
Colony Count: NO GROWTH
Organism ID, Bacteria: NO GROWTH

## 2015-11-05 DIAGNOSIS — L570 Actinic keratosis: Secondary | ICD-10-CM | POA: Diagnosis not present

## 2015-11-05 DIAGNOSIS — L820 Inflamed seborrheic keratosis: Secondary | ICD-10-CM | POA: Diagnosis not present

## 2015-11-05 DIAGNOSIS — I789 Disease of capillaries, unspecified: Secondary | ICD-10-CM | POA: Diagnosis not present

## 2015-11-05 DIAGNOSIS — L578 Other skin changes due to chronic exposure to nonionizing radiation: Secondary | ICD-10-CM | POA: Diagnosis not present

## 2015-11-07 DIAGNOSIS — M3501 Sicca syndrome with keratoconjunctivitis: Secondary | ICD-10-CM | POA: Diagnosis not present

## 2016-01-26 ENCOUNTER — Other Ambulatory Visit: Payer: Self-pay | Admitting: Internal Medicine

## 2016-02-24 ENCOUNTER — Other Ambulatory Visit: Payer: Self-pay

## 2016-02-24 MED ORDER — ATORVASTATIN CALCIUM 10 MG PO TABS: 10.0000 mg | ORAL_TABLET | Freq: Every day | ORAL | Status: DC

## 2016-02-24 NOTE — Telephone Encounter (Signed)
Medication refills

## 2016-03-05 ENCOUNTER — Ambulatory Visit (INDEPENDENT_AMBULATORY_CARE_PROVIDER_SITE_OTHER): Payer: PPO | Admitting: Internal Medicine

## 2016-03-05 ENCOUNTER — Encounter: Payer: Self-pay | Admitting: Internal Medicine

## 2016-03-05 VITALS — BP 162/92 | HR 92 | Ht 60.0 in | Wt 128.2 lb

## 2016-03-05 DIAGNOSIS — I10 Essential (primary) hypertension: Secondary | ICD-10-CM

## 2016-03-05 DIAGNOSIS — R82998 Other abnormal findings in urine: Secondary | ICD-10-CM

## 2016-03-05 DIAGNOSIS — N390 Urinary tract infection, site not specified: Secondary | ICD-10-CM | POA: Diagnosis not present

## 2016-03-05 DIAGNOSIS — Z0000 Encounter for general adult medical examination without abnormal findings: Secondary | ICD-10-CM | POA: Diagnosis not present

## 2016-03-05 LAB — CBC WITH DIFFERENTIAL/PLATELET
BASOS PCT: 0.6 % (ref 0.0–3.0)
Basophils Absolute: 0 10*3/uL (ref 0.0–0.1)
EOS ABS: 0.2 10*3/uL (ref 0.0–0.7)
Eosinophils Relative: 3.2 % (ref 0.0–5.0)
HEMATOCRIT: 44.2 % (ref 36.0–46.0)
HEMOGLOBIN: 15 g/dL (ref 12.0–15.0)
LYMPHS PCT: 29.5 % (ref 12.0–46.0)
Lymphs Abs: 1.7 10*3/uL (ref 0.7–4.0)
MCHC: 34 g/dL (ref 30.0–36.0)
MCV: 95.2 fl (ref 78.0–100.0)
MONOS PCT: 11.6 % (ref 3.0–12.0)
Monocytes Absolute: 0.7 10*3/uL (ref 0.1–1.0)
Neutro Abs: 3.2 10*3/uL (ref 1.4–7.7)
Neutrophils Relative %: 55.1 % (ref 43.0–77.0)
Platelets: 276 10*3/uL (ref 150.0–400.0)
RBC: 4.64 Mil/uL (ref 3.87–5.11)
RDW: 12.5 % (ref 11.5–15.5)
WBC: 5.8 10*3/uL (ref 4.0–10.5)

## 2016-03-05 LAB — LIPID PANEL
CHOLESTEROL: 197 mg/dL (ref 0–200)
HDL: 34.5 mg/dL — ABNORMAL LOW (ref >39.00)
LDL Cholesterol: 125 mg/dL — ABNORMAL HIGH (ref 0–99)
NonHDL: 162.13
TRIGLYCERIDES: 188 mg/dL — AB (ref 0.0–149.0)
Total CHOL/HDL Ratio: 6
VLDL: 37.6 mg/dL (ref 0.0–40.0)

## 2016-03-05 LAB — POCT URINALYSIS DIPSTICK
Bilirubin, UA: NEGATIVE
Glucose, UA: NEGATIVE
Ketones, UA: NEGATIVE
Nitrite, UA: NEGATIVE
PROTEIN UA: NEGATIVE
SPEC GRAV UA: 1.01
UROBILINOGEN UA: 0.2
pH, UA: 7

## 2016-03-05 LAB — MICROALBUMIN / CREATININE URINE RATIO
CREATININE, U: 33.3 mg/dL
MICROALB/CREAT RATIO: 2.1 mg/g (ref 0.0–30.0)
Microalb, Ur: <0.7 mg/dL (ref 0.0–1.9)

## 2016-03-05 LAB — COMPREHENSIVE METABOLIC PANEL
ALBUMIN: 4.7 g/dL (ref 3.5–5.2)
ALT: 28 U/L (ref 0–35)
AST: 23 U/L (ref 0–37)
Alkaline Phosphatase: 89 U/L (ref 39–117)
BILIRUBIN TOTAL: 0.7 mg/dL (ref 0.2–1.2)
BUN: 12 mg/dL (ref 6–23)
CALCIUM: 11.2 mg/dL — AB (ref 8.4–10.5)
CHLORIDE: 102 meq/L (ref 96–112)
CO2: 33 mEq/L — ABNORMAL HIGH (ref 19–32)
CREATININE: 0.73 mg/dL (ref 0.40–1.20)
GFR: 83.03 mL/min (ref >60.00)
Glucose, Bld: 96 mg/dL (ref 70–99)
Potassium: 4.3 mEq/L (ref 3.5–5.1)
Sodium: 138 mEq/L (ref 135–145)
Total Protein: 7.3 g/dL (ref 6.0–8.3)

## 2016-03-05 MED ORDER — METOPROLOL SUCCINATE ER 50 MG PO TB24: ORAL_TABLET | ORAL | Status: DC

## 2016-03-05 MED ORDER — ATORVASTATIN CALCIUM 10 MG PO TABS: 10.0000 mg | ORAL_TABLET | Freq: Every day | ORAL | Status: DC

## 2016-03-05 MED ORDER — ALPRAZOLAM 0.25 MG PO TABS: ORAL_TABLET | ORAL | Status: DC

## 2016-03-05 NOTE — Patient Instructions (Signed)
Health Maintenance, Female Adopting a healthy lifestyle and getting preventive care can go a long way to promote health and wellness. Talk with your health care provider about what schedule of regular examinations is right for you. This is a good chance for you to check in with your provider about disease prevention and staying healthy. In between checkups, there are plenty of things you can do on your own. Experts have done a lot of research about which lifestyle changes and preventive measures are most likely to keep you healthy. Ask your health care provider for more information. WEIGHT AND DIET  Eat a healthy diet  Be sure to include plenty of vegetables, fruits, low-fat dairy products, and lean protein.  Do not eat a lot of foods high in solid fats, added sugars, or salt.  Get regular exercise. This is one of the most important things you can do for your health.  Most adults should exercise for at least 150 minutes each week. The exercise should increase your heart rate and make you sweat (moderate-intensity exercise).  Most adults should also do strengthening exercises at least twice a week. This is in addition to the moderate-intensity exercise.  Maintain a healthy weight  Body mass index (BMI) is a measurement that can be used to identify possible weight problems. It estimates body fat based on height and weight. Your health care provider can help determine your BMI and help you achieve or maintain a healthy weight.  For females 20 years of age and older:   A BMI below 18.5 is considered underweight.  A BMI of 18.5 to 24.9 is normal.  A BMI of 25 to 29.9 is considered overweight.  A BMI of 30 and above is considered obese.  Watch levels of cholesterol and blood lipids  You should start having your blood tested for lipids and cholesterol at 73 years of age, then have this test every 5 years.  You may need to have your cholesterol levels checked more often if:  Your lipid  or cholesterol levels are high.  You are older than 73 years of age.  You are at high risk for heart disease.  CANCER SCREENING   Lung Cancer  Lung cancer screening is recommended for adults 55-80 years old who are at high risk for lung cancer because of a history of smoking.  A yearly low-dose CT scan of the lungs is recommended for people who:  Currently smoke.  Have quit within the past 15 years.  Have at least a 30-pack-year history of smoking. A pack year is smoking an average of one pack of cigarettes a day for 1 year.  Yearly screening should continue until it has been 15 years since you quit.  Yearly screening should stop if you develop a health problem that would prevent you from having lung cancer treatment.  Breast Cancer  Practice breast self-awareness. This means understanding how your breasts normally appear and feel.  It also means doing regular breast self-exams. Let your health care provider know about any changes, no matter how small.  If you are in your 20s or 30s, you should have a clinical breast exam (CBE) by a health care provider every 1-3 years as part of a regular health exam.  If you are 40 or older, have a CBE every year. Also consider having a breast X-ray (mammogram) every year.  If you have a family history of breast cancer, talk to your health care provider about genetic screening.  If you   are at high risk for breast cancer, talk to your health care provider about having an MRI and a mammogram every year.  Breast cancer gene (BRCA) assessment is recommended for women who have family members with BRCA-related cancers. BRCA-related cancers include:  Breast.  Ovarian.  Tubal.  Peritoneal cancers.  Results of the assessment will determine the need for genetic counseling and BRCA1 and BRCA2 testing. Cervical Cancer Your health care provider may recommend that you be screened regularly for cancer of the pelvic organs (ovaries, uterus, and  vagina). This screening involves a pelvic examination, including checking for microscopic changes to the surface of your cervix (Pap test). You may be encouraged to have this screening done every 3 years, beginning at age 21.  For women ages 30-65, health care providers may recommend pelvic exams and Pap testing every 3 years, or they may recommend the Pap and pelvic exam, combined with testing for human papilloma virus (HPV), every 5 years. Some types of HPV increase your risk of cervical cancer. Testing for HPV may also be done on women of any age with unclear Pap test results.  Other health care providers may not recommend any screening for nonpregnant women who are considered low risk for pelvic cancer and who do not have symptoms. Ask your health care provider if a screening pelvic exam is right for you.  If you have had past treatment for cervical cancer or a condition that could lead to cancer, you need Pap tests and screening for cancer for at least 20 years after your treatment. If Pap tests have been discontinued, your risk factors (such as having a new sexual partner) need to be reassessed to determine if screening should resume. Some women have medical problems that increase the chance of getting cervical cancer. In these cases, your health care provider may recommend more frequent screening and Pap tests. Colorectal Cancer  This type of cancer can be detected and often prevented.  Routine colorectal cancer screening usually begins at 73 years of age and continues through 73 years of age.  Your health care provider may recommend screening at an earlier age if you have risk factors for colon cancer.  Your health care provider may also recommend using home test kits to check for hidden blood in the stool.  A small camera at the end of a tube can be used to examine your colon directly (sigmoidoscopy or colonoscopy). This is done to check for the earliest forms of colorectal  cancer.  Routine screening usually begins at age 50.  Direct examination of the colon should be repeated every 5-10 years through 73 years of age. However, you may need to be screened more often if early forms of precancerous polyps or small growths are found. Skin Cancer  Check your skin from head to toe regularly.  Tell your health care provider about any new moles or changes in moles, especially if there is a change in a mole's shape or color.  Also tell your health care provider if you have a mole that is larger than the size of a pencil eraser.  Always use sunscreen. Apply sunscreen liberally and repeatedly throughout the day.  Protect yourself by wearing long sleeves, pants, a wide-brimmed hat, and sunglasses whenever you are outside. HEART DISEASE, DIABETES, AND HIGH BLOOD PRESSURE   High blood pressure causes heart disease and increases the risk of stroke. High blood pressure is more likely to develop in:  People who have blood pressure in the high end   of the normal range (130-139/85-89 mm Hg).  People who are overweight or obese.  People who are African American.  If you are 38-23 years of age, have your blood pressure checked every 3-5 years. If you are 61 years of age or older, have your blood pressure checked every year. You should have your blood pressure measured twice--once when you are at a hospital or clinic, and once when you are not at a hospital or clinic. Record the average of the two measurements. To check your blood pressure when you are not at a hospital or clinic, you can use:  An automated blood pressure machine at a pharmacy.  A home blood pressure monitor.  If you are between 45 years and 39 years old, ask your health care provider if you should take aspirin to prevent strokes.  Have regular diabetes screenings. This involves taking a blood sample to check your fasting blood sugar level.  If you are at a normal weight and have a low risk for diabetes,  have this test once every three years after 73 years of age.  If you are overweight and have a high risk for diabetes, consider being tested at a younger age or more often. PREVENTING INFECTION  Hepatitis B  If you have a higher risk for hepatitis B, you should be screened for this virus. You are considered at high risk for hepatitis B if:  You were born in a country where hepatitis B is common. Ask your health care provider which countries are considered high risk.  Your parents were born in a high-risk country, and you have not been immunized against hepatitis B (hepatitis B vaccine).  You have HIV or AIDS.  You use needles to inject street drugs.  You live with someone who has hepatitis B.  You have had sex with someone who has hepatitis B.  You get hemodialysis treatment.  You take certain medicines for conditions, including cancer, organ transplantation, and autoimmune conditions. Hepatitis C  Blood testing is recommended for:  Everyone born from [birth year] through 1963.  Anyone with known risk factors for hepatitis C. Sexually transmitted infections (STIs)  You should be screened for sexually transmitted infections (STIs) including gonorrhea and chlamydia if:  You are sexually active and are younger than 73 years of age.  You are older than 73 years of age and your health care provider tells you that you are at risk for this type of infection.  Your sexual activity has changed since you were last screened and you are at an increased risk for chlamydia or gonorrhea. Ask your health care provider if you are at risk.  If you do not have HIV, but are at risk, it may be recommended that you take a prescription medicine daily to prevent HIV infection. This is called pre-exposure prophylaxis (PrEP). You are considered at risk if:  You are sexually active and do not regularly use condoms or know the HIV status of your partner(s).  You take drugs by injection.  You are sexually  active with a partner who has HIV. Talk with your health care provider about whether you are at high risk of being infected with HIV. If you choose to begin PrEP, you should first be tested for HIV. You should then be tested every 3 months for as long as you are taking PrEP.  PREGNANCY   If you are premenopausal and you may become pregnant, ask your health care provider about preconception counseling.  If you may  become pregnant, take 400 to 800 micrograms (mcg) of folic acid every day.  If you want to prevent pregnancy, talk to your health care provider about birth control (contraception). OSTEOPOROSIS AND MENOPAUSE   Osteoporosis is a disease in which the bones lose minerals and strength with aging. This can result in serious bone fractures. Your risk for osteoporosis can be identified using a bone density scan.  If you are 61 years of age or older, or if you are at risk for osteoporosis and fractures, ask your health care provider if you should be screened.  Ask your health care provider whether you should take a calcium or vitamin D supplement to lower your risk for osteoporosis.  Menopause may have certain physical symptoms and risks.  Hormone replacement therapy may reduce some of these symptoms and risks. Talk to your health care provider about whether hormone replacement therapy is right for you.  HOME CARE INSTRUCTIONS   Schedule regular health, dental, and eye exams.  Stay current with your immunizations.   Do not use any tobacco products including cigarettes, chewing tobacco, or electronic cigarettes.  If you are pregnant, do not drink alcohol.  If you are breastfeeding, limit how much and how often you drink alcohol.  Limit alcohol intake to no more than 1 drink per day for nonpregnant women. One drink equals 12 ounces of beer, 5 ounces of wine, or 1 ounces of hard liquor.  Do not use street drugs.  Do not share needles.  Ask your health care provider for help if  you need support or information about quitting drugs.  Tell your health care provider if you often feel depressed.  Tell your health care provider if you have ever been abused or do not feel safe at home.   This information is not intended to replace advice given to you by your health care provider. Make sure you discuss any questions you have with your health care provider.   Document Released: 02/23/2011 Document Revised: 08/31/2014 Document Reviewed: 07/12/2013 Elsevier Interactive Patient Education [employer].

## 2016-03-05 NOTE — Assessment & Plan Note (Signed)
BP Readings from Last 3 Encounters:  03/05/16 162/92  09/17/15 138/82  09/05/15 135/81   BP elevated today. Will recheck in 2 weeks. If persistently elevated, consider increase in Metoprolol to 100mg .

## 2016-03-05 NOTE — Progress Notes (Signed)
Subjective:    Patient ID: Melissa Turner, female    DOB: [birth year], 75 y.o.   MRN: [id number]  HPI  75YO female presents for physical exam.  Generally feeling well. Some increased stress at home with recent painting.  Wt Readings from Last 3 Encounters:  03/05/16 128 lb 3.2 oz (58.151 kg)  09/17/15 125 lb (56.7 kg)  09/05/15 129 lb (58.514 kg)   BP Readings from Last 3 Encounters:  03/05/16 162/92  09/17/15 138/82  09/05/15 135/81    Past Medical History  Diagnosis Date  . Deviated septum     s/p rhinoplasty  . History of chicken pox   . GERD (gastroesophageal reflux disease)   . HTN (hypertension)   . Hypercholesteremia   . History of blood transfusion     during first child's birth  . Migraines    Family History  Problem Relation Age of Onset  . Heart disease Mother   . Diabetes Father   . Heart failure Father   . Dementia Father   . Cancer Other     Breast, liver, lung   Past Surgical History  Procedure Laterality Date  . Vaginal delivery      x2  . Tonsillectomy  1950  . Dilation and curettage of uterus    . Rhinoplasty    . Bunionectomy     Social History   Social History  . Marital Status: Married    Spouse Name: N/A  . Number of Children: 2  . Years of Education: N/A   Occupational History  . Medical Records [employer]    Social History Main Topics  . Smoking status: Never Smoker   . Smokeless tobacco: Never Used  . Alcohol Use: Yes     Comment: Wine 2 to 3 times a week  . Drug Use: No  . Sexual Activity: Not Asked   Other Topics Concern  . None   Social History Narrative   Lives with husband and 2 grandchildren. Works at [employer].      Regular Exercise -  Aerobic 30 min every day   Daily Caffeine Use:  1 cup sw tea daily             Review of Systems  Constitutional: Negative for fever, chills, appetite change, fatigue and unexpected weight change.  Eyes: Negative for visual disturbance.    Respiratory: Negative for cough and shortness of breath.   Cardiovascular: Negative for chest pain and leg swelling.  Gastrointestinal: Negative for nausea, vomiting, abdominal pain, diarrhea and constipation.  Genitourinary: Positive for urgency and frequency.  Musculoskeletal: Negative for myalgias and arthralgias.  Skin: Negative for color change and rash.  Hematological: Negative for adenopathy. Does not bruise/bleed easily.  Psychiatric/Behavioral: Negative for suicidal ideas, sleep disturbance and dysphoric mood. The patient is not nervous/anxious.        Objective:    BP 162/92 mmHg  Pulse 92  Ht 5' (1.524 m)  Wt 128 lb 3.2 oz (58.151 kg)  BMI 25.04 kg/m2  SpO2 98% Physical Exam  Constitutional: She is oriented to person, place, and time. She appears well-developed and well-nourished. No distress.  HENT:  Head: Normocephalic and atraumatic.  Right Ear: External ear normal.  Left Ear: External ear normal.  Nose: Nose normal.  Mouth/Throat: Oropharynx is clear and moist. No oropharyngeal exudate.  Eyes: Conjunctivae are normal. Pupils are equal, round, and reactive to light. Right eye exhibits no discharge. Left eye exhibits no discharge. No  scleral icterus.  Neck: Normal range of motion. Neck supple. No tracheal deviation present. No thyromegaly present.  Cardiovascular: Normal rate, regular rhythm, normal heart sounds and intact distal pulses.  Exam reveals no gallop and no friction rub.   No murmur heard. Pulmonary/Chest: Effort normal and breath sounds normal. No accessory muscle usage. No tachypnea. No respiratory distress. She has no decreased breath sounds. She has no wheezes. She has no rales. She exhibits no tenderness. Right breast exhibits no inverted nipple, no mass, no nipple discharge, no skin change and no tenderness. Left breast exhibits no inverted nipple, no mass, no nipple discharge, no skin change and no tenderness. Breasts are symmetrical.  Abdominal: Soft.  Bowel sounds are normal. She exhibits no distension and no mass. There is no tenderness. There is no rebound and no guarding.  Musculoskeletal: Normal range of motion. She exhibits no edema or tenderness.  Lymphadenopathy:    She has no cervical adenopathy.  Neurological: She is alert and oriented to person, place, and time. No cranial nerve deficit. She exhibits normal muscle tone. Coordination normal.  Skin: Skin is warm and dry. No rash noted. She is not diaphoretic. No erythema. No pallor.  Psychiatric: She has a normal mood and affect. Her behavior is normal. Judgment and thought content normal.          Assessment & Plan:   Problem List Items Addressed This Visit      Unprioritized   Hypertension    BP Readings from Last 3 Encounters:  03/05/16 162/92  09/17/15 138/82  09/05/15 135/81   BP elevated today. Will recheck in 2 weeks. If persistently elevated, consider increase in Metoprolol to 100mg .      Relevant Medications   atorvastatin (LIPITOR) 10 MG tablet   metoprolol succinate (TOPROL-XL) 50 MG 24 hr tablet   Routine general medical examination at a health care facility - Primary    General medical exam normal today including breast exam. PAP and pelvic deferred given age and preference. Last PAP in 2011 was normal, HPV neg. Will request records on colonoscopy. Immunizations are UTD. Labs today. Encouraged healthy diet and exercise.      Relevant Orders   CBC with Differential/Platelet   Comprehensive metabolic panel   Lipid panel   Microalbumin / creatinine urine ratio   MM Digital Screening   POCT Urinalysis Dipstick       Return in about 4 weeks (around 04/02/2016) for New Patient.  Ronette Deter, MD [facility]

## 2016-03-05 NOTE — Assessment & Plan Note (Signed)
General medical exam normal today including breast exam. PAP and pelvic deferred given age and preference. Last PAP in 2011 was normal, HPV neg. Will request records on colonoscopy. Immunizations are UTD. Labs today. Encouraged healthy diet and exercise.

## 2016-03-05 NOTE — Addendum Note (Signed)
Addended by: Frutoso Chase A on: 03/05/2016 09:22 AM   Modules accepted: Orders, SmartSet

## 2016-03-05 NOTE — Addendum Note (Signed)
Addended by: Elpidio Galea T on: 03/05/2016 01:18 PM   Modules accepted: Orders, SmartSet

## 2016-03-05 NOTE — Progress Notes (Signed)
Pre visit review using our clinic review tool, if applicable. No additional management support is needed unless otherwise documented below in the visit note. 

## 2016-03-07 LAB — URINE CULTURE

## 2016-03-09 ENCOUNTER — Encounter: Payer: Self-pay | Admitting: Internal Medicine

## 2016-04-02 ENCOUNTER — Ambulatory Visit (INDEPENDENT_AMBULATORY_CARE_PROVIDER_SITE_OTHER): Payer: PPO | Admitting: Family

## 2016-04-02 ENCOUNTER — Encounter: Payer: Self-pay | Admitting: Family

## 2016-04-02 DIAGNOSIS — I10 Essential (primary) hypertension: Secondary | ICD-10-CM | POA: Diagnosis not present

## 2016-04-02 NOTE — Assessment & Plan Note (Addendum)
Blood pressure slightly elevated today. No signs or symptoms of hypertensive urgency or emergency. Patient is only taking 25 mg of metoprolol at home. We jointly decided that she would trial taking 50 mg of metoprolol to see if her blood pressure improved. She will be vigilant with any side effects of medication including a headedness, fatigue. She will keep a log at home and call us with the results. Will follow.

## 2016-04-02 NOTE — Patient Instructions (Signed)
Leisure meeting you today. As we discussed, but keep a blood pressure log at 25 mg of metoprolol and then at 50 mg of metoprolol so we can decide what the best dose is for you. Please call our office with these readings.  Continue the great exercise!!!   If there is no improvement in your symptoms, or if there is any worsening of symptoms, or if you have any additional concerns, please return for re-evaluation; or, if we are closed, consider going to the Emergency Room for evaluation if symptoms urgent.

## 2016-04-02 NOTE — Progress Notes (Signed)
Pre visit review using our clinic review tool, if applicable. No additional management support is needed unless otherwise documented below in the visit note. 

## 2016-04-02 NOTE — Progress Notes (Signed)
Subjective:    Patient ID: Melissa Turner, female    DOB: [birth year], 75 y.o.   MRN: [id number]  CC: Melissa Turner is a 75 y.o. female who presents today for follow up.   HPI: She here for follow-up on chronic diseases. Is up-to-date on routine physical which she had in July 2017.   HTN- At home she monitors BP with averages 148/82. Only taking one half of metoprolol, 25mg  QD. Had lightheaded episode on 50mg QD. No syncope. Denies exertional chest pain or pressure, numbness or tingling radiating to left arm or jaw, palpitations, dizziness, frequent headaches, changes in vision, or shortness of breath.    Primary hyperparathyroidism : Follows with endocrine.    HISTORY:  Past Medical History:  Diagnosis Date  . Deviated septum    s/p rhinoplasty  . GERD (gastroesophageal reflux disease)   . History of blood transfusion    during first child's birth  . History of chicken pox   . HTN (hypertension)   . Hypercholesteremia   . Migraines    Past Surgical History:  Procedure Laterality Date  . BUNIONECTOMY    . DILATION AND CURETTAGE OF UTERUS    . RHINOPLASTY    . TONSILLECTOMY  1950  . VAGINAL DELIVERY     x2   Family History  Problem Relation Age of Onset  . Heart disease Mother   . Diabetes Father   . Heart failure Father   . Dementia Father   . Cancer Other     Breast, liver, lung    Allergies: Review of patient's allergies indicates no known allergies. Current Outpatient Prescriptions on File Prior to Visit  Medication Sig Dispense Refill  . ALPRAZolam (XANAX) 0.25 MG tablet TAKE 1 TABLET BY MOUTH EVERY DAY 30 tablet 4  . aspirin 325 MG tablet Take 325 mg by mouth daily.    Marland Kitchen atorvastatin (LIPITOR) 10 MG tablet Take 1 tablet (10 mg total) by mouth daily. 90 tablet 3  . metoprolol succinate (TOPROL-XL) 50 MG 24 hr tablet TAKE 1 TABLET (50 MG TOTAL) BY MOUTH DAILY. 90 tablet 3   No current facility-administered medications on file prior to visit.     Social  History  Substance Use Topics  . Smoking status: Never Smoker  . Smokeless tobacco: Never Used  . Alcohol use Yes     Comment: Wine 2 to 3 times a week    Review of Systems  Constitutional: Negative for chills and fever.  Eyes: Negative for visual disturbance.  Respiratory: Negative for cough.   Cardiovascular: Negative for chest pain, palpitations and leg swelling.  Gastrointestinal: Negative for nausea and vomiting.      Objective:    BP (!) 158/88 (BP Location: Left Arm, Patient Position: Sitting, Cuff Size: Normal)   Pulse 87   Temp 98.3 F (36.8 C) (Oral)   Wt 127 lb (57.6 kg)   SpO2 97%   BMI 24.80 kg/m  BP Readings from Last 3 Encounters:  04/02/16 (!) 158/88  03/05/16 (!) 162/92  09/17/15 138/82   Wt Readings from Last 3 Encounters:  04/02/16 127 lb (57.6 kg)  03/05/16 128 lb 3.2 oz (58.2 kg)  09/17/15 125 lb (56.7 kg)    Physical Exam  Constitutional: She appears well-developed and well-nourished.  Eyes: Conjunctivae are normal.  Cardiovascular: Normal rate, regular rhythm, normal heart sounds and normal pulses.   Pulmonary/Chest: Effort normal and breath sounds normal. She has no wheezes. She has no rhonchi. She has no  rales.  Neurological: She is alert.  Skin: Skin is warm and dry.  Psychiatric: She has a normal mood and affect. Her speech is normal and behavior is normal. Thought content normal.  Vitals reviewed.      Assessment & Plan:   Problem List Items Addressed This Visit      Cardiovascular and Mediastinum   Hypertension    Blood pressure slightly elevated today. No signs or symptoms of hypertensive urgency or emergency. Patient is only taking 25 mg of metoprolol at home. We jointly decided that she would trial taking 50 mg of metoprolol to see if her blood pressure improved. She will be vigilant with any side effects of medication including a headedness, fatigue. She will keep a log at home and call us with the results. Will follow.         Other Visit Diagnoses   None.      I am having [name] maintain her aspirin, ALPRAZolam, atorvastatin, and metoprolol succinate.   No orders of the defined types were placed in this encounter.   Return precautions given.   Risks, benefits, and alternatives of the medications and treatment plan prescribed today were discussed, and patient expressed understanding.   Education regarding symptom management and diagnosis given to patient on AVS.  Continue to follow with Rica Mast, MD for routine health maintenance.   Dickens and I agreed with plan.   Mable Paris, FNP

## 2016-04-16 ENCOUNTER — Ambulatory Visit
Admission: RE | Admit: 2016-04-16 | Discharge: 2016-04-16 | Disposition: A | Discharge status: Home / Self Care | Payer: PPO | Source: Ambulatory Visit | Attending: Internal Medicine | Admitting: Internal Medicine

## 2016-04-16 ENCOUNTER — Other Ambulatory Visit: Payer: Self-pay | Admitting: Internal Medicine

## 2016-04-16 DIAGNOSIS — Z0000 Encounter for general adult medical examination without abnormal findings: Secondary | ICD-10-CM | POA: Insufficient documentation

## 2016-04-16 DIAGNOSIS — Z1231 Encounter for screening mammogram for malignant neoplasm of breast: Secondary | ICD-10-CM | POA: Diagnosis not present

## 2016-05-15 DIAGNOSIS — E559 Vitamin D deficiency, unspecified: Secondary | ICD-10-CM | POA: Diagnosis not present

## 2016-05-22 DIAGNOSIS — M85852 Other specified disorders of bone density and structure, left thigh: Secondary | ICD-10-CM | POA: Diagnosis not present

## 2016-05-22 DIAGNOSIS — E559 Vitamin D deficiency, unspecified: Secondary | ICD-10-CM | POA: Diagnosis not present

## 2016-05-22 DIAGNOSIS — Z780 Asymptomatic menopausal state: Secondary | ICD-10-CM | POA: Diagnosis not present

## 2016-06-17 ENCOUNTER — Ambulatory Visit: Payer: Medicare Other

## 2016-06-25 ENCOUNTER — Ambulatory Visit (INDEPENDENT_AMBULATORY_CARE_PROVIDER_SITE_OTHER): Payer: PPO

## 2016-06-25 VITALS — BP 138/70 | HR 61 | Temp 97.1°F | Resp 12 | Ht 60.0 in | Wt 126.4 lb

## 2016-06-25 DIAGNOSIS — Z0000 Encounter for general adult medical examination without abnormal findings: Secondary | ICD-10-CM

## 2016-06-25 NOTE — Patient Instructions (Addendum)
Melissa Turner , Thank you for taking time to come for your Medicare Wellness Visit. I appreciate your ongoing commitment to your health goals. Please review the following plan we discussed and let me know if I can assist you in the future.   FOLLOW UP WITH [name], FNP AS NEEDED.  These are the goals we discussed: Goals    . Increase water intake       This is a list of the screening recommended for you and due dates:  Health Maintenance  Topic Date Due  . Shingles Vaccine  06/24/2017*  . Tetanus Vaccine  06/24/2017*  . Colon Cancer Screening  01/28/2018  . Mammogram  04/16/2018  . Flu Shot  Completed  . DEXA scan (bone density measurement)  Completed  . Pneumonia vaccines  Completed  *Topic was postponed. The date shown is not the original due date.    Health Maintenance, Female Adopting a healthy lifestyle and getting preventive care can go a long way to promote health and wellness. Talk with your health care provider about what schedule of regular examinations is right for you. This is a good chance for you to check in with your provider about disease prevention and staying healthy. In between checkups, there are plenty of things you can do on your own. Experts have done a lot of research about which lifestyle changes and preventive measures are most likely to keep you healthy. Ask your health care provider for more information. WEIGHT AND DIET  Eat a healthy diet  Be sure to include plenty of vegetables, fruits, low-fat dairy products, and lean protein.  Do not eat a lot of foods high in solid fats, added sugars, or salt.  Get regular exercise. This is one of the most important things you can do for your health.  Most adults should exercise for at least 150 minutes each week. The exercise should increase your heart rate and make you sweat (moderate-intensity exercise).  Most adults should also do strengthening exercises at least twice a week. This is in addition to  the moderate-intensity exercise.  Maintain a healthy weight  Body mass index (BMI) is a measurement that can be used to identify possible weight problems. It estimates body fat based on height and weight. Your health care provider can help determine your BMI and help you achieve or maintain a healthy weight.  For females 97 years of age and older:   A BMI below 18.5 is considered underweight.  A BMI of 18.5 to 24.9 is normal.  A BMI of 25 to 29.9 is considered overweight.  A BMI of 30 and above is considered obese.  Watch levels of cholesterol and blood lipids  You should start having your blood tested for lipids and cholesterol at 73 years of age, then have this test every 5 years.  You may need to have your cholesterol levels checked more often if:  Your lipid or cholesterol levels are high.  You are older than 73 years of age.  You are at high risk for heart disease.  CANCER SCREENING   Lung Cancer  Lung cancer screening is recommended for adults 38-21 years old who are at high risk for lung cancer because of a history of smoking.  A yearly low-dose CT scan of the lungs is recommended for people who:  Currently smoke.  Have quit within the past 15 years.  Have at least a 30-pack-year history of smoking. A pack year is smoking an average of one  pack of cigarettes a day for 1 year.  Yearly screening should continue until it has been 15 years since you quit.  Yearly screening should stop if you develop a health problem that would prevent you from having lung cancer treatment.  Breast Cancer  Practice breast self-awareness. This means understanding how your breasts normally appear and feel.  It also means doing regular breast self-exams. Let your health care provider know about any changes, no matter how small.  If you are in your 20s or 30s, you should have a clinical breast exam (CBE) by a health care provider every 1-3 years as part of a regular health  exam.  If you are 15 or older, have a CBE every year. Also consider having a breast X-ray (mammogram) every year.  If you have a family history of breast cancer, talk to your health care provider about genetic screening.  If you are at high risk for breast cancer, talk to your health care provider about having an MRI and a mammogram every year.  Breast cancer gene (BRCA) assessment is recommended for women who have family members with BRCA-related cancers. BRCA-related cancers include:  Breast.  Ovarian.  Tubal.  Peritoneal cancers.  Results of the assessment will determine the need for genetic counseling and BRCA1 and BRCA2 testing. Cervical Cancer Your health care provider may recommend that you be screened regularly for cancer of the pelvic organs (ovaries, uterus, and vagina). This screening involves a pelvic examination, including checking for microscopic changes to the surface of your cervix (Pap test). You may be encouraged to have this screening done every 3 years, beginning at age 28.  For women ages 15-65, health care providers may recommend pelvic exams and Pap testing every 3 years, or they may recommend the Pap and pelvic exam, combined with testing for human papilloma virus (HPV), every 5 years. Some types of HPV increase your risk of cervical cancer. Testing for HPV may also be done on women of any age with unclear Pap test results.  Other health care providers may not recommend any screening for nonpregnant women who are considered low risk for pelvic cancer and who do not have symptoms. Ask your health care provider if a screening pelvic exam is right for you.  If you have had past treatment for cervical cancer or a condition that could lead to cancer, you need Pap tests and screening for cancer for at least 20 years after your treatment. If Pap tests have been discontinued, your risk factors (such as having a new sexual partner) need to be reassessed to determine if  screening should resume. Some women have medical problems that increase the chance of getting cervical cancer. In these cases, your health care provider may recommend more frequent screening and Pap tests. Colorectal Cancer  This type of cancer can be detected and often prevented.  Routine colorectal cancer screening usually begins at 73 years of age and continues through 73 years of age.  Your health care provider may recommend screening at an earlier age if you have risk factors for colon cancer.  Your health care provider may also recommend using home test kits to check for hidden blood in the stool.  A small camera at the end of a tube can be used to examine your colon directly (sigmoidoscopy or colonoscopy). This is done to check for the earliest forms of colorectal cancer.  Routine screening usually begins at age 43.  Direct examination of the colon should be repeated every  5-10 years through 73 years of age. However, you may need to be screened more often if early forms of precancerous polyps or small growths are found. Skin Cancer  Check your skin from head to toe regularly.  Tell your health care provider about any new moles or changes in moles, especially if there is a change in a mole's shape or color.  Also tell your health care provider if you have a mole that is larger than the size of a pencil eraser.  Always use sunscreen. Apply sunscreen liberally and repeatedly throughout the day.  Protect yourself by wearing long sleeves, pants, a wide-brimmed hat, and sunglasses whenever you are outside. HEART DISEASE, DIABETES, AND HIGH BLOOD PRESSURE   High blood pressure causes heart disease and increases the risk of stroke. High blood pressure is more likely to develop in:  People who have blood pressure in the high end of the normal range (130-139/85-89 mm Hg).  People who are overweight or obese.  People who are African American.  If you are 42-95 years of age, have your  blood pressure checked every 3-5 years. If you are 53 years of age or older, have your blood pressure checked every year. You should have your blood pressure measured twice--once when you are at a hospital or clinic, and once when you are not at a hospital or clinic. Record the average of the two measurements. To check your blood pressure when you are not at a hospital or clinic, you can use:  An automated blood pressure machine at a pharmacy.  A home blood pressure monitor.  If you are between 65 years and 23 years old, ask your health care provider if you should take aspirin to prevent strokes.  Have regular diabetes screenings. This involves taking a blood sample to check your fasting blood sugar level.  If you are at a normal weight and have a low risk for diabetes, have this test once every three years after 73 years of age.  If you are overweight and have a high risk for diabetes, consider being tested at a younger age or more often. PREVENTING INFECTION  Hepatitis B  If you have a higher risk for hepatitis B, you should be screened for this virus. You are considered at high risk for hepatitis B if:  You were born in a country where hepatitis B is common. Ask your health care provider which countries are considered high risk.  Your parents were born in a high-risk country, and you have not been immunized against hepatitis B (hepatitis B vaccine).  You have HIV or AIDS.  You use needles to inject street drugs.  You live with someone who has hepatitis B.  You have had sex with someone who has hepatitis B.  You get hemodialysis treatment.  You take certain medicines for conditions, including cancer, organ transplantation, and autoimmune conditions. Hepatitis C  Blood testing is recommended for:  Everyone born from 1 through 1965.  Anyone with known risk factors for hepatitis C. Sexually transmitted infections (STIs)  You should be screened for sexually transmitted  infections (STIs) including gonorrhea and chlamydia if:  You are sexually active and are younger than 73 years of age.  You are older than 73 years of age and your health care provider tells you that you are at risk for this type of infection.  Your sexual activity has changed since you were last screened and you are at an increased risk for chlamydia or gonorrhea. Ask  your health care provider if you are at risk.  If you do not have HIV, but are at risk, it may be recommended that you take a prescription medicine daily to prevent HIV infection. This is called pre-exposure prophylaxis (PrEP). You are considered at risk if:  You are sexually active and do not regularly use condoms or know the HIV status of your partner(s).  You take drugs by injection.  You are sexually active with a partner who has HIV. Talk with your health care provider about whether you are at high risk of being infected with HIV. If you choose to begin PrEP, you should first be tested for HIV. You should then be tested every 3 months for as long as you are taking PrEP.  PREGNANCY   If you are premenopausal and you may become pregnant, ask your health care provider about preconception counseling.  If you may become pregnant, take 400 to 800 micrograms (mcg) of folic acid every day.  If you want to prevent pregnancy, talk to your health care provider about birth control (contraception). OSTEOPOROSIS AND MENOPAUSE   Osteoporosis is a disease in which the bones lose minerals and strength with aging. This can result in serious bone fractures. Your risk for osteoporosis can be identified using a bone density scan.  If you are 73 years of age or older, or if you are at risk for osteoporosis and fractures, ask your health care provider if you should be screened.  Ask your health care provider whether you should take a calcium or vitamin D supplement to lower your risk for osteoporosis.  Menopause may have certain physical  symptoms and risks.  Hormone replacement therapy may reduce some of these symptoms and risks. Talk to your health care provider about whether hormone replacement therapy is right for you.  HOME CARE INSTRUCTIONS   Schedule regular health, dental, and eye exams.  Stay current with your immunizations.   Do not use any tobacco products including cigarettes, chewing tobacco, or electronic cigarettes.  If you are pregnant, do not drink alcohol.  If you are breastfeeding, limit how much and how often you drink alcohol.  Limit alcohol intake to no more than 1 drink per day for nonpregnant women. One drink equals 12 ounces of beer, 5 ounces of wine, or 1 ounces of hard liquor.  Do not use street drugs.  Do not share needles.  Ask your health care provider for help if you need support or information about quitting drugs.  Tell your health care provider if you often feel depressed.  Tell your health care provider if you have ever been abused or do not feel safe at home.   This information is not intended to replace advice given to you by your health care provider. Make sure you discuss any questions you have with your health care provider.   Document Released: 02/23/2011 Document Revised: 08/31/2014 Document Reviewed: 07/12/2013 Elsevier Interactive Patient Education [employer].

## 2016-06-25 NOTE — Progress Notes (Signed)
Subjective:   Melissa Turner is a 75 y.o. female who presents for Medicare Annual (Subsequent) preventive examination.  Review of Systems:  No ROS.  Medicare Wellness Visit.  Cardiac Risk Factors include: advanced age (>38men, >73 women);hypertension     Objective:     Vitals: BP 138/70 (BP Location: Left Arm, Patient Position: Sitting, Cuff Size: Normal)   Pulse 61   Temp 97.1 F (36.2 C) (Oral)   Resp 12   Ht 5' (1.524 m)   Wt 126 lb 6.4 oz (57.3 kg)   SpO2 96%   BMI 24.69 kg/m   Body mass index is 24.69 kg/m.   Tobacco History  Smoking Status  . Never Smoker  Smokeless Tobacco  . Never Used     Counseling given: Not Answered   Past Medical History:  Diagnosis Date  . Deviated septum    s/p rhinoplasty  . GERD (gastroesophageal reflux disease)   . History of blood transfusion    during first child's birth  . History of chicken pox   . HTN (hypertension)   . Hypercholesteremia   . Migraines    Past Surgical History:  Procedure Laterality Date  . BREAST CYST ASPIRATION Left   . BUNIONECTOMY    . DILATION AND CURETTAGE OF UTERUS    . RHINOPLASTY    . TONSILLECTOMY  1950  . VAGINAL DELIVERY     x2   Family History  Problem Relation Age of Onset  . Heart disease Mother   . Diabetes Father   . Heart failure Father   . Dementia Father   . Cancer Other     Breast, liver, lung   History  Sexual Activity  . Sexual activity: Not Currently    Outpatient Encounter Prescriptions as of 06/25/2016  Medication Sig  . Omega-3 Fatty Acids (FISH OIL PO) Take 1 capsule by mouth 2 (two) times daily.  Marland Kitchen ALPRAZolam (XANAX) 0.25 MG tablet TAKE 1 TABLET BY MOUTH EVERY DAY  . aspirin 325 MG tablet Take 325 mg by mouth daily.  Marland Kitchen atorvastatin (LIPITOR) 10 MG tablet Take 1 tablet (10 mg total) by mouth daily.  . Cholecalciferol (VITAMIN D3) 1000 units CAPS Take by mouth.  . metoprolol succinate (TOPROL-XL) 50 MG 24 hr tablet TAKE 1 TABLET (50 MG TOTAL) BY MOUTH  DAILY.   No facility-administered encounter medications on file as of 06/25/2016.     Activities of Daily Living In your present state of health, do you have any difficulty performing the following activities: 06/25/2016  Hearing? N  Vision? N  Difficulty concentrating or making decisions? N  Walking or climbing stairs? N  Dressing or bathing? N  Doing errands, shopping? N  Preparing Food and eating ? N  Using the Toilet? N  In the past six months, have you accidently leaked urine? N  Do you have problems with loss of bowel control? N  Managing your Medications? N  Managing your Finances? N  Housekeeping or managing your Housekeeping? N  Some recent data might be hidden    Patient Care Team: Burnard Hawthorne, FNP as PCP - General (Family Medicine)    Assessment:    This is a routine wellness examination for Melissa Turner. The goal of the wellness visit is to assist the patient how to close the gaps in care and create a preventative care plan for the patient.   Taking calcium VIT D as appropriate/Osteoporosis risk reviewed.  Medications reviewed; taking without issues or barriers.  Safety issues reviewed; lives alone. Smoke detectors in the home. No firearms in the home. Wears seatbelts when driving or riding with others. No violence in the home.  No identified risk were noted; The patient was oriented x 3; appropriate in dress and manner and no objective failures at ADL's or IADL's.   Dental; she has seen her dentist within the last six months.  (Dr. Thereasa Parkin).  Body mass index; normal.  Discussed the importance of a healthy diet, water intake and exercise. Educational material provided.  Health maintenance gaps; closed.  Patient Concerns: None at this time. Follow up with PCP as needed.  Exercise Activities and Dietary recommendations Current Exercise Habits: Home exercise routine, Type of exercise: stretching (Yoga, toning), Time (Minutes): 30, Frequency (Times/Week):  4, Weekly Exercise (Minutes/Week): 120, Intensity: Moderate  Goals    . Increase water intake      Fall Risk Fall Risk  06/25/2016 04/02/2016 03/05/2016 01/02/2015 12/15/2013  Falls in the past year? No No No No No   Depression Screen PHQ 2/9 Scores 06/25/2016 04/02/2016 03/05/2016 01/02/2015  PHQ - 2 Score 0 0 0 0     Cognitive Function MMSE - Mini Mental State Exam 06/25/2016  Orientation to time 5  Orientation to Place 5  Registration 3  Attention/ Calculation 5  Recall 3  Language- name 2 objects 2  Language- repeat 1  Language- follow 3 step command 3  Language- read & follow direction 1  Write a sentence 1  Copy design 1  Total score 30        Immunization History  Administered Date(s) Administered  . Influenza Split 06/25/2011, 06/13/2012  . Influenza,inj,Quad PF,36+ Mos 09/05/2015  . Influenza-Unspecified 06/08/2013, 06/26/2014, 06/07/2016  . Pneumococcal Conjugate-13 12/15/2013  . Pneumococcal Polysaccharide-23 12/12/2012   Screening Tests Health Maintenance  Topic Date Due  . ZOSTAVAX  06/24/2017 (Originally 01/17/2003)  . TETANUS/TDAP  06/24/2017 (Originally 01/16/1962)  . COLONOSCOPY  01/28/2018  . MAMMOGRAM  04/16/2018  . INFLUENZA VACCINE  Completed  . DEXA SCAN  Completed  . PNA vac Low Risk Adult  Completed      Plan:    End of life planning; Advance aging; Advanced directives discussed. Copy of current HCPOA/Living Will requested.  Medicare Attestation I have personally reviewed: The patient's medical and social history Their use of alcohol, tobacco or illicit drugs Their current medications and supplements The patient's functional ability including ADLs,fall risks, home safety risks, cognitive, and hearing and visual impairment Diet and physical activities Evidence for depression   The patient's weight, height, BMI, and visual acuity have been recorded in the chart.  I have made referrals and provided education to the patient based on review of  the above and I have provided the patient with a written personalized care plan for preventive services.    During the course of the visit the patient was educated and counseled about the following appropriate screening and preventive services:   Vaccines to include Pneumoccal, Influenza, Hepatitis B, Td, Zostavax, HCV  Electrocardiogram  Cardiovascular Disease  Colorectal cancer screening  Bone density screening  Diabetes screening  Glaucoma screening  Mammography/PAP  Nutrition counseling   Patient Instructions (the written plan) was given to the patient.   OBrien-Blaney, Denisa L, LPN  [id number]  I agree with above plan. Mable Paris, FNP

## 2016-07-10 DIAGNOSIS — I10 Essential (primary) hypertension: Secondary | ICD-10-CM | POA: Diagnosis not present

## 2016-07-10 DIAGNOSIS — Z1211 Encounter for screening for malignant neoplasm of colon: Secondary | ICD-10-CM | POA: Diagnosis not present

## 2016-07-22 DIAGNOSIS — H40003 Preglaucoma, unspecified, bilateral: Secondary | ICD-10-CM | POA: Diagnosis not present

## 2016-08-20 DIAGNOSIS — N812 Incomplete uterovaginal prolapse: Secondary | ICD-10-CM | POA: Diagnosis not present

## 2016-08-20 DIAGNOSIS — Z01419 Encounter for gynecological examination (general) (routine) without abnormal findings: Secondary | ICD-10-CM | POA: Diagnosis not present

## 2016-09-28 ENCOUNTER — Encounter: Payer: Medicare Other | Admitting: Obstetrics and Gynecology

## 2016-09-30 ENCOUNTER — Ambulatory Visit (INDEPENDENT_AMBULATORY_CARE_PROVIDER_SITE_OTHER): Payer: PPO

## 2016-09-30 ENCOUNTER — Ambulatory Visit (INDEPENDENT_AMBULATORY_CARE_PROVIDER_SITE_OTHER): Payer: PPO | Admitting: Obstetrics & Gynecology

## 2016-09-30 ENCOUNTER — Ambulatory Visit (INDEPENDENT_AMBULATORY_CARE_PROVIDER_SITE_OTHER): Payer: PPO | Admitting: Podiatry

## 2016-09-30 ENCOUNTER — Encounter: Payer: Self-pay | Admitting: Obstetrics & Gynecology

## 2016-09-30 VITALS — BP 150/96 | HR 95 | Resp 16

## 2016-09-30 VITALS — BP 146/77 | HR 90 | Ht 60.0 in | Wt 123.0 lb

## 2016-09-30 DIAGNOSIS — M79671 Pain in right foot: Secondary | ICD-10-CM | POA: Diagnosis not present

## 2016-09-30 DIAGNOSIS — L609 Nail disorder, unspecified: Secondary | ICD-10-CM | POA: Diagnosis not present

## 2016-09-30 DIAGNOSIS — M722 Plantar fascial fibromatosis: Secondary | ICD-10-CM | POA: Diagnosis not present

## 2016-09-30 DIAGNOSIS — L603 Nail dystrophy: Secondary | ICD-10-CM | POA: Diagnosis not present

## 2016-09-30 DIAGNOSIS — N812 Incomplete uterovaginal prolapse: Secondary | ICD-10-CM | POA: Diagnosis not present

## 2016-09-30 DIAGNOSIS — N814 Uterovaginal prolapse, unspecified: Secondary | ICD-10-CM

## 2016-09-30 NOTE — Progress Notes (Signed)
   Subjective:    Patient ID: Melissa Turner, female    DOB: January 29, 1941, 76 y.o.   MRN: [id number]  HPI  76 yo MW P2 here with a 2 month h/o bladder prolapse. She denies GSUI. She has been abstinent for several years.  Review of Systems Pap and mammogram normal    Objective:   Physical Exam [id number] Appears younger than stated age 32rd degree bladder prolapse, 2nd degree uterine prolapse       Assessment & Plan:  Symptomatic prolapse- discussed pessary versus TVH/BS/Anterior repair. At this time, she wants to try a pessary I fitted her with a #3 ring with diphragm. We will order her one.

## 2016-09-30 NOTE — Progress Notes (Signed)
   Subjective:    Patient ID: Melissa Turner, female    DOB: [birth year], 76 y.o.   MRN: [id number]  HPI: She presents today chief concern of fungus to the toenails. She states that had for years a sample was taken last year and come back with 13 different problems. She's also complaining of a four-month heel pain to the right heel. She states it is painful to walk on a throbbing pain she denies any trauma she states that she takes aspirin with some relief.    Review of Systems  All other systems reviewed and are negative.      Objective:   Physical Exam: Vital signs are stable alert and oriented 3. Pulses are strongly palpable. Neurologic sensorium is intact. Deep tendon reflexes are intact. Muscle strength +5 over 5 dorsiflexion plantar flexors and inverters everters AND musculatures intact. She is on palpation medial calcaneal tubercle of the right heel. 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 Dorsal spurring is noted on lateral view. Cutaneous evaluation demonstrates mild flexible hammertoe deformities resulting in distal irritation of the toes and a nail dystrophy. I would be highly surprised if any of this were actually fungus. The only nail that may actually be fungus B possibly be the hallux left.        Assessment & Plan:  Nail dystrophy bilateral foot. Plantar fasciitis right foot.  Plan: I injected the right heel today with Kenalog and local anesthetic dispensed a plantar fascial night splint. I also took samples of the toenail today to be sent for pathologic evaluation we will notify her once we have the report. Otherwise I will follow up with her in 1 month

## 2016-10-07 ENCOUNTER — Telehealth: Payer: Self-pay | Admitting: *Deleted

## 2016-10-07 NOTE — Telephone Encounter (Addendum)
-----   Message from Garrel Ridgel, [place] sent at 10/06/2016  5:14 PM EST ----- Neg fungus. 10/07/2016-left message to call for lab results. 10/08/2016-Pt called again.10/09/2016-I informed pt of Dr. Stephenie Acres review of labs.

## 2016-10-08 ENCOUNTER — Encounter: Payer: Self-pay | Admitting: Family

## 2016-10-08 ENCOUNTER — Ambulatory Visit (INDEPENDENT_AMBULATORY_CARE_PROVIDER_SITE_OTHER): Payer: PPO | Admitting: Family

## 2016-10-08 VITALS — BP 130/78 | HR 92 | Temp 98.3°F | Ht 60.0 in | Wt 124.6 lb

## 2016-10-08 DIAGNOSIS — I10 Essential (primary) hypertension: Secondary | ICD-10-CM

## 2016-10-08 NOTE — Assessment & Plan Note (Signed)
On 25mg  toprol. No CP, HA, vision changes. At goal today; however appears fluctuant when reviewing records from recent doctor's appointments. Will bring cuff to next visit. Discussed limiting salt and exercise program. Will follow.

## 2016-10-08 NOTE — Progress Notes (Signed)
Subjective:    Patient ID: Melissa Turner, female    DOB: 1940/10/24, 76 y.o.   MRN: [id number]  CC: Melissa Turner is a 76 y.o. female who presents today for follow up.   HPI: HTN- trial of 50mg  toprol from 25mg  at last visit, however didn't 'feel good' so now back to 25mg . Felt tired. Thinks 'white coat'.   Values at home 140's/ 82  Denies exertional chest pain or pressure, numbness or tingling radiating to left arm or jaw, palpitations, dizziness, frequent headaches, changes in vision, or shortness of breath.         HISTORY:  Past Medical History:  Diagnosis Date  . Deviated septum    s/p rhinoplasty  . GERD (gastroesophageal reflux disease)   . History of blood transfusion    during first child's birth  . History of chicken pox   . HTN (hypertension)   . Hypercholesteremia   . Migraines    Past Surgical History:  Procedure Laterality Date  . BREAST CYST ASPIRATION Left   . BUNIONECTOMY    . DILATION AND CURETTAGE OF UTERUS    . RHINOPLASTY    . TONSILLECTOMY  1950  . VAGINAL DELIVERY     x2   Family History  Problem Relation Age of Onset  . Heart disease Mother   . Diabetes Father   . Heart failure Father   . Dementia Father   . Cancer Other     Breast, liver, lung    Allergies: Patient has no known allergies. Current Outpatient Prescriptions on File Prior to Visit  Medication Sig Dispense Refill  . ALPRAZolam (XANAX) 0.25 MG tablet TAKE 1 TABLET BY MOUTH EVERY DAY 30 tablet 4  . aspirin 325 MG tablet Take 325 mg by mouth daily.    Marland Kitchen atorvastatin (LIPITOR) 10 MG tablet Take 1 tablet (10 mg total) by mouth daily. 90 tablet 3  . metoprolol succinate (TOPROL-XL) 50 MG 24 hr tablet TAKE 1 TABLET (50 MG TOTAL) BY MOUTH DAILY. 90 tablet 3   No current facility-administered medications on file prior to visit.     Social History  Substance Use Topics  . Smoking status: Never Smoker  . Smokeless tobacco: Never Used  . Alcohol use Yes     Comment:  Wine 2 to 3 times a week    Review of Systems  Constitutional: Negative for chills and fever.  Respiratory: Negative for cough.   Cardiovascular: Negative for chest pain and palpitations.  Gastrointestinal: Negative for nausea and vomiting.      Objective:    BP 130/78   Pulse 92   Temp 98.3 F (36.8 C) (Oral)   Ht 5' (1.524 m)   Wt 124 lb 9.6 oz (56.5 kg)   SpO2 95%   BMI 24.33 kg/m  BP Readings from Last 3 Encounters:  10/08/16 130/78  09/30/16 (!) 146/77  09/30/16 (!) 150/96   Wt Readings from Last 3 Encounters:  10/08/16 124 lb 9.6 oz (56.5 kg)  09/30/16 123 lb (55.8 kg)  06/25/16 126 lb 6.4 oz (57.3 kg)    Physical Exam  Constitutional: Melissa Turner appears well-developed and well-nourished.  Eyes: Conjunctivae are normal.  Cardiovascular: Normal rate, regular rhythm, normal heart sounds and normal pulses.   Pulmonary/Chest: Effort normal and breath sounds normal. Melissa Turner has no wheezes. Melissa Turner has no rhonchi. Melissa Turner has no rales.  Neurological: Melissa Turner is alert.  Skin: Skin is warm and dry.  Psychiatric: Melissa Turner has a normal mood and  affect. Melissa Turner speech is normal and behavior is normal. Thought content normal.  Vitals reviewed.      Assessment & Plan:   Problem List Items Addressed This Visit      Cardiovascular and Mediastinum   Hypertension - Primary    On 25mg  toprol. No CP, HA, vision changes. At goal today; however appears fluctuant when reviewing records from recent doctor's appointments. Will bring cuff to next visit. Discussed limiting salt and exercise program. Will follow.           I am having Melissa Turner maintain Melissa Turner aspirin, ALPRAZolam, atorvastatin, and metoprolol succinate.   No orders of the defined types were placed in this encounter.   Return precautions given.   Risks, benefits, and alternatives of the medications and treatment plan prescribed today were discussed, and patient expressed understanding.   Education regarding symptom management and  diagnosis given to patient on AVS.  Continue to follow with Mable Paris, FNP for routine health maintenance.   [place] and I agreed with plan.   Mable Paris, FNP

## 2016-10-08 NOTE — Progress Notes (Signed)
Pre visit review using our clinic review tool, if applicable. No additional management support is needed unless otherwise documented below in the visit note. 

## 2016-10-08 NOTE — Patient Instructions (Signed)
Lets continue to watch salt and spot check BP  Goal < 140/90  Let me know how you're doing   Managing Your Hypertension Hypertension is commonly called high blood pressure. Blood pressure is a measurement of how strongly your blood is pressing against the walls of your arteries. Arteries are blood vessels that carry blood from your heart throughout your body. Blood pressure does not stay the same. It rises when you are active, excited, or nervous. It lowers when you are sleeping or relaxed. If the numbers that measure your blood pressure stay above normal most of the time, you are at risk for health problems. Hypertension is a long-term (chronic) condition in which blood pressure is elevated. This condition often has no signs or symptoms. The cause of the condition is usually not known. What are blood pressure readings? A blood pressure reading is recorded as two numbers, such as "120 over 80" (or 120/80). The first ("top") number is called the systolic pressure. It is a measure of the pressure in your arteries as the heart beats. The second ("bottom") number is called the diastolic pressure. It is a measure of the pressure in your arteries as the heart relaxes between beats. What does my blood pressure reading mean? Blood pressure is classified into four stages. Based on your blood pressure reading, your health care provider may use the following stages to determine what type of treatment, if any, is needed. Systolic pressure and diastolic pressure are measured in a unit called mm Hg. Normal  Systolic pressure: below 123456.  Diastolic pressure: below 80. Prehypertension  Systolic pressure: 123456.  Diastolic pressure: XX123456. Hypertension stage 1  Systolic pressure: A999333.  Diastolic pressure: A999333. Hypertension stage 2  Systolic pressure: 0000000 or above.  Diastolic pressure: 123XX123 or above. What health risks are associated with hypertension? Managing your hypertension is an  important responsibility. Uncontrolled hypertension can lead to:  A heart attack.  A stroke.  A weakened blood vessel (aneurysm).  Heart failure.  Kidney damage.  Eye damage.  Metabolic syndrome.  Memory and concentration problems. What changes can I make to manage my hypertension? Hypertension can be managed effectively by making lifestyle changes and possibly by taking medicines. Your health care provider will help you come up with a plan to bring your blood pressure within a normal range. Your plan should include the following: Monitoring  Monitor your blood pressure at home as told by your health care provider. Your personal target blood pressure may vary depending on your medical conditions, your age, and other factors.  Have your blood pressure rechecked as told by your health care provider. Lifestyle  Lose weight if necessary.  Get at least 30-45 minutes of aerobic exercise at least 4 times a week.  Do not use any products that contain nicotine or tobacco, such as cigarettes and e-cigarettes. If you need help quitting, ask your health care provider.  Learn ways to reduce stress.  Control any chronic conditions, such as high cholesterol or diabetes. Eating and drinking  Follow the DASH diet. This diet is high in fruits, vegetables, and whole grains. It is low in salt, red meat, and added sugars.  Keep your sodium intake below 2,300 mg per day.  Limit alcoholic beverages. Communication  Review all the medicines you take with your health care provider because there may be side effects or interactions.  Talk with your health care provider about your diet, exercise habits, and other lifestyle factors that may be contributing to hypertension.  See your health care provider regularly. Your health care provider can help you create and adjust your plan for managing hypertension. Will I need medicine to control my blood pressure? Your health care provider may prescribe  medicine if lifestyle changes are not enough to get your blood pressure under control, and if one of the following is true:  You are 57-48 years of age, and your systolic blood pressure is 140 or higher.  You are 68 years of age or older, and your systolic blood pressure is 150 or higher.  Your diastolic blood pressure is 90 or higher.  You have diabetes, and your systolic blood pressure is over [id number] or your diastolic blood pressure is over 90.  You have kidney disease, and your blood pressure is above 140/90.  You have heart disease or a history of stroke, and your blood pressure is 140/90 or higher. Take medicines only as told by your health care provider. Follow the directions carefully. Blood pressure medicines must be taken as prescribed. The medicine does not work as well when you skip doses. Skipping doses also puts you at risk for problems. Contact a health care provider if:  You think you are having a reaction to medicines you have taken.  You have repeated (recurrent) headaches.  You feel dizzy.  You have swelling in your ankles.  You have trouble with your vision. Get help right away if:  You develop a severe headache or confusion.  You have unusual weakness or numbness, or you feel faint.  You have severe pain in your chest or abdomen.  You vomit repeatedly.  You have trouble breathing. This information is not intended to replace advice given to you by your health care provider. Make sure you discuss any questions you have with your health care provider. Document Released: 05/04/2012 Document Revised: 04/14/2016 Document Reviewed: 11/08/2015 Elsevier Interactive Patient Education  2017 [employer].

## 2016-10-09 ENCOUNTER — Telehealth: Payer: Self-pay | Admitting: Family

## 2016-10-15 NOTE — Telephone Encounter (Signed)
Pt called requesting refill. Please advise, thank you!  Pharmacy - [facility] [place], [place] - [address]  Call pt @ [phone]

## 2016-10-15 NOTE — Telephone Encounter (Signed)
Refill request for Xanax, last seen [id number], last filled [id number].  Please advise.

## 2016-10-16 ENCOUNTER — Ambulatory Visit (INDEPENDENT_AMBULATORY_CARE_PROVIDER_SITE_OTHER): Payer: PPO | Admitting: Obstetrics & Gynecology

## 2016-10-16 ENCOUNTER — Encounter: Payer: Self-pay | Admitting: Obstetrics & Gynecology

## 2016-10-16 VITALS — BP 172/84 | HR 77 | Wt 121.4 lb

## 2016-10-16 DIAGNOSIS — N814 Uterovaginal prolapse, unspecified: Secondary | ICD-10-CM | POA: Diagnosis not present

## 2016-10-19 NOTE — Telephone Encounter (Signed)
Call pt - Refilled. We have discussed her anxiety. If she finds that it is worsening, please advise her to make an OV as have better, safer medications for anxiety.

## 2016-10-20 NOTE — Telephone Encounter (Signed)
Pt called and stated that the pharmacy told her that we cannot send this rx over electronically. Please advise, thank you!  Call pt [phone]

## 2016-10-22 MED ORDER — ALPRAZOLAM 0.25 MG PO TABS: 0.2500 mg | ORAL_TABLET | Freq: Every day | ORAL | 0 refills | Status: DC | PRN

## 2016-10-22 NOTE — Telephone Encounter (Signed)
Medication rx has been faxed.

## 2016-10-22 NOTE — Addendum Note (Signed)
Addended by: Elpidio Galea T on: 10/22/2016 03:49 PM   Modules accepted: Orders

## 2016-10-23 ENCOUNTER — Other Ambulatory Visit: Payer: Self-pay

## 2016-10-23 NOTE — Progress Notes (Signed)
   Subjective:    Patient ID: Melissa Turner, female    DOB: 14-Oct-1940, 76 y.o.   MRN: [id number]  HPI 76 yo MW P2 here to pick up her #3 ring pessary with diaphragm for her symptomatic cystocele.   Review of Systems     Objective:   Physical Exam [id number] Breathing, conversing, and ambulating normally She was able to place and remove the pessary.       Assessment & Plan:  Cystocele- pessary Rec remove pessary q Sunday nights RTC 1 month for pessary check

## 2016-10-28 ENCOUNTER — Ambulatory Visit (INDEPENDENT_AMBULATORY_CARE_PROVIDER_SITE_OTHER): Payer: PPO | Admitting: Podiatry

## 2016-10-28 DIAGNOSIS — M722 Plantar fascial fibromatosis: Secondary | ICD-10-CM

## 2016-10-28 DIAGNOSIS — L603 Nail dystrophy: Secondary | ICD-10-CM

## 2016-10-28 DIAGNOSIS — M79671 Pain in right foot: Secondary | ICD-10-CM

## 2016-10-28 NOTE — Progress Notes (Signed)
She presents today for follow-up of her plantar fasciitis. He states that her feet feel absolutely wonderful. She is also presenting today for follow-up of the pathology of the hallux nail plate left.  Objective: Vital signs are stable alert and oriented 3. Pulses are palpable. Pathology reports no fungus visible. As she has no pain on palpation medially located. Pulses remain palpable.  Assessment: Well-healing plantar fasciitis. Negative culture for onychomycosis.  Plan: Follow up with me on an as-needed basis.

## 2016-11-12 DIAGNOSIS — D126 Benign neoplasm of colon, unspecified: Secondary | ICD-10-CM | POA: Diagnosis not present

## 2016-11-12 DIAGNOSIS — Z1211 Encounter for screening for malignant neoplasm of colon: Secondary | ICD-10-CM | POA: Diagnosis not present

## 2016-11-12 DIAGNOSIS — K640 First degree hemorrhoids: Secondary | ICD-10-CM | POA: Diagnosis not present

## 2016-11-12 DIAGNOSIS — K5730 Diverticulosis of large intestine without perforation or abscess without bleeding: Secondary | ICD-10-CM | POA: Diagnosis not present

## 2016-11-12 DIAGNOSIS — D120 Benign neoplasm of cecum: Secondary | ICD-10-CM | POA: Diagnosis not present

## 2016-11-12 DIAGNOSIS — K648 Other hemorrhoids: Secondary | ICD-10-CM | POA: Diagnosis not present

## 2016-11-13 ENCOUNTER — Ambulatory Visit: Payer: Medicare Other | Admitting: Obstetrics & Gynecology

## 2016-11-17 DIAGNOSIS — L820 Inflamed seborrheic keratosis: Secondary | ICD-10-CM | POA: Diagnosis not present

## 2016-11-17 DIAGNOSIS — L578 Other skin changes due to chronic exposure to nonionizing radiation: Secondary | ICD-10-CM | POA: Diagnosis not present

## 2016-11-17 DIAGNOSIS — L821 Other seborrheic keratosis: Secondary | ICD-10-CM | POA: Diagnosis not present

## 2016-11-17 DIAGNOSIS — L570 Actinic keratosis: Secondary | ICD-10-CM | POA: Diagnosis not present

## 2017-03-02 DIAGNOSIS — L821 Other seborrheic keratosis: Secondary | ICD-10-CM | POA: Diagnosis not present

## 2017-03-02 DIAGNOSIS — L578 Other skin changes due to chronic exposure to nonionizing radiation: Secondary | ICD-10-CM | POA: Diagnosis not present

## 2017-03-02 DIAGNOSIS — L820 Inflamed seborrheic keratosis: Secondary | ICD-10-CM | POA: Diagnosis not present

## 2017-03-02 DIAGNOSIS — L812 Freckles: Secondary | ICD-10-CM | POA: Diagnosis not present

## 2017-03-08 ENCOUNTER — Encounter: Payer: Medicare Other | Admitting: Family

## 2017-03-10 ENCOUNTER — Ambulatory Visit (INDEPENDENT_AMBULATORY_CARE_PROVIDER_SITE_OTHER): Payer: PPO | Admitting: Family

## 2017-03-10 ENCOUNTER — Encounter: Payer: Self-pay | Admitting: Family

## 2017-03-10 VITALS — BP 146/80 | HR 72 | Temp 98.3°F | Ht 60.0 in | Wt 125.6 lb

## 2017-03-10 DIAGNOSIS — Z0001 Encounter for general adult medical examination with abnormal findings: Secondary | ICD-10-CM | POA: Diagnosis not present

## 2017-03-10 DIAGNOSIS — I10 Essential (primary) hypertension: Secondary | ICD-10-CM

## 2017-03-10 DIAGNOSIS — Z0000 Encounter for general adult medical examination without abnormal findings: Secondary | ICD-10-CM

## 2017-03-10 MED ORDER — ATORVASTATIN CALCIUM 10 MG PO TABS
10.0000 mg | ORAL_TABLET | Freq: Every day | ORAL | 3 refills | Status: DC
Start: 2017-03-10 — End: 2017-04-21

## 2017-03-10 MED ORDER — METOPROLOL SUCCINATE ER 50 MG PO TB24: ORAL_TABLET | ORAL | 3 refills | Status: DC

## 2017-03-10 NOTE — Progress Notes (Signed)
Subjective:    Patient ID: Melissa Turner, female    DOB: 1940/09/14, 76 y.o.   MRN: [id number]  CC: Melissa Turner is a 76 y.o. female who presents today for physical exam.    HPI: HTN- compliant with toprol. Denies exertional chest pain or pressure, numbness or tingling radiating to left arm or jaw, palpitations, dizziness, frequent headaches, changes in vision, or shortness of breath.      Colorectal Cancer Screening: per patient had colonoscopy with dr Tiffany Kocher this year, 2018; told to return in 5 years due to polyps; unable to see record Breast Cancer Screening: due Cervical Cancer Screening: Had a  Normal Pap smear 07/2016 with Leafy Ro per patient.  Bone Health screening/DEXA for 65+: Due Lung Cancer Screening: Doesn't have 30 year pack year history and age > 35 years.       Tetanus - due        Pneumococcal - Complete  Labs: Screening labs today. Exercise: Gets regular exercise.  Alcohol use: occaisonal Smoking/tobacco use: Nonsmoker.  Regular dental exams: UTD Wears seat belt: Yes. Skin: no skin cancer history; follows with Dr Sharyne Peach  HISTORY:  Past Medical History:  Diagnosis Date  . Deviated septum    s/p rhinoplasty  . GERD (gastroesophageal reflux disease)   . History of blood transfusion    during first child's birth  . History of chicken pox   . HTN (hypertension)   . Hypercholesteremia   . Migraines     Past Surgical History:  Procedure Laterality Date  . BREAST CYST ASPIRATION Left   . BUNIONECTOMY    . DILATION AND CURETTAGE OF UTERUS    . RHINOPLASTY    . TONSILLECTOMY  1950  . VAGINAL DELIVERY     x2   Family History  Problem Relation Age of Onset  . Heart disease Mother   . Diabetes Father   . Heart failure Father   . Dementia Father   . Cancer Other        Breast, liver, lung      ALLERGIES: Patient has no known allergies.  Current Outpatient Prescriptions on File Prior to Visit  Medication Sig Dispense Refill  . ALPRAZolam  (XANAX) 0.25 MG tablet Take 1 tablet (0.25 mg total) by mouth daily as needed for anxiety. 30 tablet 0  . aspirin 325 MG tablet Take 325 mg by mouth daily.     No current facility-administered medications on file prior to visit.     Social History  Substance Use Topics  . Smoking status: Never Smoker  . Smokeless tobacco: Never Used  . Alcohol use Yes     Comment: Wine 2 to 3 times a week    Review of Systems  Constitutional: Negative for chills, fever and unexpected weight change.  HENT: Negative for congestion.   Respiratory: Negative for cough.   Cardiovascular: Negative for chest pain, palpitations and leg swelling.  Gastrointestinal: Negative for nausea and vomiting.  Musculoskeletal: Negative for arthralgias and myalgias.  Skin: Negative for rash.  Neurological: Negative for headaches.  Hematological: Negative for adenopathy.  Psychiatric/Behavioral: Negative for confusion.      Objective:    BP (!) 146/80   Pulse 72   Temp 98.3 F (36.8 C) (Oral)   Ht 5' (1.524 m)   Wt 125 lb 9.6 oz (57 kg)   SpO2 96%   BMI 24.53 kg/m   BP Readings from Last 3 Encounters:  03/10/17 (!) 146/80  10/16/16 (!) 172/84  10/08/16 130/78   Wt Readings from Last 3 Encounters:  03/10/17 125 lb 9.6 oz (57 kg)  10/16/16 121 lb 6.4 oz (55.1 kg)  10/08/16 124 lb 9.6 oz (56.5 kg)    Physical Exam  Constitutional: She appears well-developed and well-nourished.  Eyes: Conjunctivae are normal.  Neck: No thyroid mass and no thyromegaly present.  Cardiovascular: Normal rate, regular rhythm, normal heart sounds and normal pulses.   Pulmonary/Chest: Effort normal and breath sounds normal. She has no wheezes. She has no rhonchi. She has no rales. Right breast exhibits no inverted nipple, no mass, no nipple discharge, no skin change and no tenderness. Left breast exhibits no inverted nipple, no mass, no nipple discharge, no skin change and no tenderness. Breasts are symmetrical.  CBE performed.    Lymphadenopathy:       Head (right side): No submental, no submandibular, no tonsillar, no preauricular, no posterior auricular and no occipital adenopathy present.       Head (left side): No submental, no submandibular, no tonsillar, no preauricular, no posterior auricular and no occipital adenopathy present.    She has no cervical adenopathy.       Right cervical: No superficial cervical, no deep cervical and no posterior cervical adenopathy present.      Left cervical: No superficial cervical, no deep cervical and no posterior cervical adenopathy present.    She has no axillary adenopathy.  Neurological: She is alert.  Skin: Skin is warm and dry.  Psychiatric: She has a normal mood and affect. Her speech is normal and behavior is normal. Thought content normal.  Vitals reviewed.      Assessment & Plan:   Problem List Items Addressed This Visit      Cardiovascular and Mediastinum   Hypertension    Slightly elevated.  Will continue current regimen for now. Advised patient to monitor at home.  Based on family history and personal h/o HTN, referral to [facility] for CVD risk testing.       Relevant Medications   metoprolol succinate (TOPROL-XL) 50 MG 24 hr tablet   atorvastatin (LIPITOR) 10 MG tablet   Other Relevant Orders   Ambulatory referral to Cardiology     Other   Routine general medical examination at a health care facility - Primary    CBE performed. Advised 3d mammogram and patient will schedule. tdap advised. Reports pap done , normal last year, unable to see records. Pending labs, dexa. Encouraged exercise.       Relevant Medications   metoprolol succinate (TOPROL-XL) 50 MG 24 hr tablet   atorvastatin (LIPITOR) 10 MG tablet   Other Relevant Orders   CBC with Differential/Platelet   Comprehensive metabolic panel   Hemoglobin A1c   Lipid panel   TSH   VITAMIN D 25 Hydroxy (Vit-D Deficiency, Fractures)   MM SCREENING BREAST TOMO BILATERAL   DG Bone Density        I am having Ms. Poteat maintain her aspirin, ALPRAZolam, metoprolol succinate, and atorvastatin.   Meds ordered this encounter  Medications  . metoprolol succinate (TOPROL-XL) 50 MG 24 hr tablet    Sig: TAKE 1 TABLET (50 MG TOTAL) BY MOUTH DAILY.    Dispense:  90 tablet    Refill:  3    Order Specific Question:   Supervising Provider    Answer:   Deborra Medina L [[id number]]  . atorvastatin (LIPITOR) 10 MG tablet    Sig: Take 1 tablet (10 mg total) by mouth daily.  Dispense:  90 tablet    Refill:  3    Order Specific Question:   Supervising Provider    Answer:   Crecencio Mc [[id number]]    Return precautions given.   Risks, benefits, and alternatives of the medications and treatment plan prescribed today were discussed, and patient expressed understanding.   Education regarding symptom management and diagnosis given to patient on AVS.   Continue to follow with Burnard Hawthorne, FNP for routine health maintenance.   [place] and I agreed with plan.   Mable Paris, FNP

## 2017-03-10 NOTE — Progress Notes (Signed)
Pre visit review using our clinic review tool, if applicable. No additional management support is needed unless otherwise documented below in the visit note. 

## 2017-03-10 NOTE — Assessment & Plan Note (Signed)
CBE performed. Advised 3d mammogram and patient will schedule. tdap advised. Reports pap done , normal last year, unable to see records. Pending labs, dexa. Encouraged exercise.

## 2017-03-10 NOTE — Assessment & Plan Note (Addendum)
Slightly elevated.  Will continue current regimen for now. Advised patient to monitor at home.  Based on family history and personal h/o HTN, referral to [facility] for CVD risk testing.

## 2017-03-10 NOTE — Patient Instructions (Addendum)
Tdap at local pharmacy  Labs when fasting  We placed a referral. Mammogram this year. I asked that you call one the below locations and schedule this when it is convenient for you.   If you have dense breasts, you may ask for 3D mammogram over the traditional 2D mammogram as new evidence suggest 3D is superior. Please note that NOT all insurance companies cover 3D and you may have to pay a higher copay. You may call your insurance company to further clarify your benefits.   Options for [place]  [place], [place]  * [facility] [place] [place] * Note if you ask for 3D mammogram at this location, you must request [place], [place] location*     Health Maintenance for Postmenopausal Women Menopause is a normal process in which your reproductive ability comes to an end. This process happens gradually over a span of months to years, usually between the ages of 10 and 61. Menopause is complete when you have missed 12 consecutive menstrual periods. It is important to talk with your health care provider about some of the most common conditions that affect postmenopausal women, such as heart disease, cancer, and bone loss (osteoporosis). Adopting a healthy lifestyle and getting preventive care can help to promote your health and wellness. Those actions can also lower your chances of developing some of these common conditions. What should I know about menopause? During menopause, you may experience a number of symptoms, such as:  Moderate-to-severe hot flashes.  Night sweats.  Decrease in sex drive.  Mood swings.  Headaches.  Tiredness.  Irritability.  Memory problems.  Insomnia.  Choosing to treat or not to treat menopausal changes is an individual decision that you make with your health care provider. What should I know about  hormone replacement therapy and supplements? Hormone therapy products are effective for treating symptoms that are associated with menopause, such as hot flashes and night sweats. Hormone replacement carries certain risks, especially as you become older. If you are thinking about using estrogen or estrogen with progestin treatments, discuss the benefits and risks with your health care provider. What should I know about heart disease and stroke? Heart disease, heart attack, and stroke become more likely as you age. This may be due, in part, to the hormonal changes that your body experiences during menopause. These can affect how your body processes dietary fats, triglycerides, and cholesterol. Heart attack and stroke are both medical emergencies. There are many things that you can do to help prevent heart disease and stroke:  Have your blood pressure checked at least every 1-2 years. High blood pressure causes heart disease and increases the risk of stroke.  If you are 81-22 years old, ask your health care provider if you should take aspirin to prevent a heart attack or a stroke.  Do not use any tobacco products, including cigarettes, chewing tobacco, or electronic cigarettes. If you need help quitting, ask your health care provider.  It is important to eat a healthy diet and maintain a healthy weight. ? Be sure to include plenty of vegetables, fruits, low-fat dairy products, and lean protein. ? Avoid eating foods that are high in solid fats, added sugars, or salt (sodium).  Get regular exercise. This is one of the most important things that you can do for your health. ? Try to exercise for  at least 150 minutes each week. The type of exercise that you do should increase your heart rate and make you sweat. This is known as moderate-intensity exercise. ? Try to do strengthening exercises at least twice each week. Do these in addition to the moderate-intensity exercise.  Know your numbers.Ask your  health care provider to check your cholesterol and your blood glucose. Continue to have your blood tested as directed by your health care provider.  What should I know about cancer screening? There are several types of cancer. Take the following steps to reduce your risk and to catch any cancer development as early as possible. Breast Cancer  Practice breast self-awareness. ? This means understanding how your breasts normally appear and feel. ? It also means doing regular breast self-exams. Let your health care provider know about any changes, no matter how small.  If you are 40 or older, have a clinician do a breast exam (clinical breast exam or CBE) every year. Depending on your age, family history, and medical history, it may be recommended that you also have a yearly breast X-ray (mammogram).  If you have a family history of breast cancer, talk with your health care provider about genetic screening.  If you are at high risk for breast cancer, talk with your health care provider about having an MRI and a mammogram every year.  Breast cancer (BRCA) gene test is recommended for women who have family members with BRCA-related cancers. Results of the assessment will determine the need for genetic counseling and BRCA1 and for BRCA2 testing. BRCA-related cancers include these types: ? Breast. This occurs in males or females. ? Ovarian. ? Tubal. This may also be called fallopian tube cancer. ? Cancer of the abdominal or pelvic lining (peritoneal cancer). ? Prostate. ? Pancreatic.  Cervical, Uterine, and Ovarian Cancer Your health care provider may recommend that you be screened regularly for cancer of the pelvic organs. These include your ovaries, uterus, and vagina. This screening involves a pelvic exam, which includes checking for microscopic changes to the surface of your cervix (Pap test).  For women ages 21-65, health care providers may recommend a pelvic exam and a Pap test every three  years. For women ages 86-65, they may recommend the Pap test and pelvic exam, combined with testing for human papilloma virus (HPV), every five years. Some types of HPV increase your risk of cervical cancer. Testing for HPV may also be done on women of any age who have unclear Pap test results.  Other health care providers may not recommend any screening for nonpregnant women who are considered low risk for pelvic cancer and have no symptoms. Ask your health care provider if a screening pelvic exam is right for you.  If you have had past treatment for cervical cancer or a condition that could lead to cancer, you need Pap tests and screening for cancer for at least 20 years after your treatment. If Pap tests have been discontinued for you, your risk factors (such as having a new sexual partner) need to be reassessed to determine if you should start having screenings again. Some women have medical problems that increase the chance of getting cervical cancer. In these cases, your health care provider may recommend that you have screening and Pap tests more often.  If you have a family history of uterine cancer or ovarian cancer, talk with your health care provider about genetic screening.  If you have vaginal bleeding after reaching menopause, tell your health care  provider.  There are currently no reliable tests available to screen for ovarian cancer.  Lung Cancer Lung cancer screening is recommended for adults 59-76 years old who are at high risk for lung cancer because of a history of smoking. A yearly low-dose CT scan of the lungs is recommended if you:  Currently smoke.  Have a history of at least 30 pack-years of smoking and you currently smoke or have quit within the past 15 years. A pack-year is smoking an average of one pack of cigarettes per day for one year.  Yearly screening should:  Continue until it has been 15 years since you quit.  Stop if you develop a health problem that would  prevent you from having lung cancer treatment.  Colorectal Cancer  This type of cancer can be detected and can often be prevented.  Routine colorectal cancer screening usually begins at age 24 and continues through age 50.  If you have risk factors for colon cancer, your health care provider may recommend that you be screened at an earlier age.  If you have a family history of colorectal cancer, talk with your health care provider about genetic screening.  Your health care provider may also recommend using home test kits to check for hidden blood in your stool.  A small camera at the end of a tube can be used to examine your colon directly (sigmoidoscopy or colonoscopy). This is done to check for the earliest forms of colorectal cancer.  Direct examination of the colon should be repeated every 5-10 years until age 14. However, if early forms of precancerous polyps or small growths are found or if you have a family history or genetic risk for colorectal cancer, you may need to be screened more often.  Skin Cancer  Check your skin from head to toe regularly.  Monitor any moles. Be sure to tell your health care provider: ? About any new moles or changes in moles, especially if there is a change in a mole's shape or color. ? If you have a mole that is larger than the size of a pencil eraser.  If any of your family members has a history of skin cancer, especially at a young age, talk with your health care provider about genetic screening.  Always use sunscreen. Apply sunscreen liberally and repeatedly throughout the day.  Whenever you are outside, protect yourself by wearing long sleeves, pants, a wide-brimmed hat, and sunglasses.  What should I know about osteoporosis? Osteoporosis is a condition in which bone destruction happens more quickly than new bone creation. After menopause, you may be at an increased risk for osteoporosis. To help prevent osteoporosis or the bone fractures that  can happen because of osteoporosis, the following is recommended:  If you are 20-57 years old, get at least 1,000 mg of calcium and at least 600 mg of vitamin D per day.  If you are older than age 19 but younger than age 55, get at least 1,200 mg of calcium and at least 600 mg of vitamin D per day.  If you are older than age 22, get at least 1,200 mg of calcium and at least 800 mg of vitamin D per day.  Smoking and excessive alcohol intake increase the risk of osteoporosis. Eat foods that are rich in calcium and vitamin D, and do weight-bearing exercises several times each week as directed by your health care provider. What should I know about how menopause affects my mental health? Depression may occur  at any age, but it is more common as you become older. Common symptoms of depression include:  Low or sad mood.  Changes in sleep patterns.  Changes in appetite or eating patterns.  Feeling an overall lack of motivation or enjoyment of activities that you previously enjoyed.  Frequent crying spells.  Talk with your health care provider if you think that you are experiencing depression. What should I know about immunizations? It is important that you get and maintain your immunizations. These include:  Tetanus, diphtheria, and pertussis (Tdap) booster vaccine.  Influenza every year before the flu season begins.  Pneumonia vaccine.  Shingles vaccine.  Your health care provider may also recommend other immunizations. This information is not intended to replace advice given to you by your health care provider. Make sure you discuss any questions you have with your health care provider. Document Released: 10/02/2005 Document Revised: 02/28/2016 Document Reviewed: 05/14/2015 Elsevier Interactive Patient Education  2018 [employer].

## 2017-03-15 NOTE — Progress Notes (Signed)
Left message for patient to return call back.  

## 2017-03-16 NOTE — Progress Notes (Signed)
Patient has been informed.

## 2017-03-19 ENCOUNTER — Other Ambulatory Visit (INDEPENDENT_AMBULATORY_CARE_PROVIDER_SITE_OTHER): Payer: PPO

## 2017-03-19 DIAGNOSIS — Z0000 Encounter for general adult medical examination without abnormal findings: Secondary | ICD-10-CM | POA: Diagnosis not present

## 2017-03-19 LAB — CBC WITH DIFFERENTIAL/PLATELET
BASOS PCT: 0.9 % (ref 0.0–3.0)
Basophils Absolute: 0 10*3/uL (ref 0.0–0.1)
Eosinophils Absolute: 0.3 10*3/uL (ref 0.0–0.7)
Eosinophils Relative: 5 % (ref 0.0–5.0)
HEMATOCRIT: 44.2 % (ref 36.0–46.0)
Hemoglobin: 15.1 g/dL — ABNORMAL HIGH (ref 12.0–15.0)
LYMPHS ABS: 1.8 10*3/uL (ref 0.7–4.0)
LYMPHS PCT: 33.4 % (ref 12.0–46.0)
MCHC: 34.1 g/dL (ref 30.0–36.0)
MCV: 96.9 fl (ref 78.0–100.0)
MONOS PCT: 9 % (ref 3.0–12.0)
Monocytes Absolute: 0.5 10*3/uL (ref 0.1–1.0)
NEUTROS ABS: 2.8 10*3/uL (ref 1.4–7.7)
NEUTROS PCT: 51.7 % (ref 43.0–77.0)
PLATELETS: 283 10*3/uL (ref 150.0–400.0)
RBC: 4.56 Mil/uL (ref 3.87–5.11)
RDW: 12.2 % (ref 11.5–15.5)
WBC: 5.4 10*3/uL (ref 4.0–10.5)

## 2017-03-19 LAB — LIPID PANEL
CHOLESTEROL: 164 mg/dL (ref 0–200)
HDL: 32.4 mg/dL — AB (ref >39.00)
LDL CALC: 109 mg/dL — AB (ref 0–99)
NonHDL: 131.22
TRIGLYCERIDES: 110 mg/dL (ref 0.0–149.0)
Total CHOL/HDL Ratio: 5
VLDL: 22 mg/dL (ref 0.0–40.0)

## 2017-03-19 LAB — COMPREHENSIVE METABOLIC PANEL
ALBUMIN: 4.6 g/dL (ref 3.5–5.2)
ALT: 18 U/L (ref 0–35)
AST: 20 U/L (ref 0–37)
Alkaline Phosphatase: 84 U/L (ref 39–117)
BUN: 15 mg/dL (ref 6–23)
CALCIUM: 10.8 mg/dL — AB (ref 8.4–10.5)
CHLORIDE: 103 meq/L (ref 96–112)
CO2: 29 meq/L (ref 19–32)
CREATININE: 0.76 mg/dL (ref 0.40–1.20)
GFR: 79.03 mL/min (ref >60.00)
Glucose, Bld: 96 mg/dL (ref 70–99)
POTASSIUM: 4.2 meq/L (ref 3.5–5.1)
Sodium: 140 mEq/L (ref 135–145)
Total Bilirubin: 0.7 mg/dL (ref 0.2–1.2)
Total Protein: 7.3 g/dL (ref 6.0–8.3)

## 2017-03-19 LAB — HEMOGLOBIN A1C: HEMOGLOBIN A1C: 5.6 % (ref 4.6–6.5)

## 2017-03-19 LAB — TSH: TSH: 2.73 u[IU]/mL (ref 0.35–4.50)

## 2017-03-19 LAB — VITAMIN D 25 HYDROXY (VIT D DEFICIENCY, FRACTURES): VITD: 22.17 ng/mL — ABNORMAL LOW (ref 30.00–100.00)

## 2017-03-20 ENCOUNTER — Other Ambulatory Visit: Payer: Self-pay | Admitting: Family

## 2017-03-25 ENCOUNTER — Other Ambulatory Visit: Payer: Self-pay | Admitting: Family

## 2017-03-25 DIAGNOSIS — F419 Anxiety disorder, unspecified: Secondary | ICD-10-CM

## 2017-03-25 NOTE — Telephone Encounter (Signed)
Last refill was march 1st, 2018, last appt was in July. Please advise, thanks

## 2017-03-25 NOTE — Telephone Encounter (Signed)
refilled   I looked up patient on [place] Controlled Substances Reporting System and saw no activity that raised concern of inappropriate use.

## 2017-04-20 ENCOUNTER — Other Ambulatory Visit: Payer: Self-pay | Admitting: Internal Medicine

## 2017-04-21 ENCOUNTER — Other Ambulatory Visit: Payer: Self-pay

## 2017-04-21 DIAGNOSIS — Z0000 Encounter for general adult medical examination without abnormal findings: Secondary | ICD-10-CM

## 2017-04-21 MED ORDER — ATORVASTATIN CALCIUM 10 MG PO TABS: 10.0000 mg | ORAL_TABLET | Freq: Every day | ORAL | 3 refills | Status: DC

## 2017-05-11 DIAGNOSIS — I788 Other diseases of capillaries: Secondary | ICD-10-CM | POA: Diagnosis not present

## 2017-05-11 DIAGNOSIS — L578 Other skin changes due to chronic exposure to nonionizing radiation: Secondary | ICD-10-CM | POA: Diagnosis not present

## 2017-05-11 DIAGNOSIS — L570 Actinic keratosis: Secondary | ICD-10-CM | POA: Diagnosis not present

## 2017-05-11 DIAGNOSIS — L820 Inflamed seborrheic keratosis: Secondary | ICD-10-CM | POA: Diagnosis not present

## 2017-05-11 NOTE — Progress Notes (Signed)
Cardiology Office Note  Date:  05/13/2017   ID:  Melissa Turner, DOB [birth year], MRN [id number]  PCP:  Melissa Hawthorne, FNP   Chief Complaint  Patient presents with  . other    Ref by Melissa Penner, PA for uncontrolled HTN & family history of CAD. Meds reviewed by the pt. verbally. Denies chest pain or shortness of breath.     HPI:  Melissa Turner is a 76 year old woman with history of nonsmoker HTN Hyperlipidemia Family history of coronary disease Who presents by referral from Melissa Turner for evaluation of her hypertension and risk stratification for coronary disease  She denies any significant chest pain on exertion, Active, trying to watch her diet  Tolerating her blood pressure medication, Previously on metoprolol 50 mg and felt lightheaded, decrease the dose down to 25 daily Bp at home 132/80 to 138/85  Lab work reviewed with her in detail HBA1C 5.6 Total chol 164, LDL 109 Stopped lipitor secondary to myalgias  EKG personally reviewed by myself on todays visit Shows normal sinus rhythm rate 81 bpm no significant ST or T-wave changes, PVCs noted  Family history reviewed on today's visit Mom with CAD age 68, PCI, smoker Father possible CHF, diabetes GM with MI, 84, died of CVA, smoker  PMH:   has a past medical history of Deviated septum; GERD (gastroesophageal reflux disease); History of blood transfusion; History of chicken pox; HTN (hypertension); Hypercholesteremia; and Migraines.  PSH:    Past Surgical History:  Procedure Laterality Date  . BREAST CYST ASPIRATION Left   . BUNIONECTOMY    . DILATION AND CURETTAGE OF UTERUS    . RHINOPLASTY    . TONSILLECTOMY  1950  . VAGINAL DELIVERY     x2    Current Outpatient Prescriptions  Medication Sig Dispense Refill  . ALPRAZolam (XANAX) 0.25 MG tablet TAKE 1 TABLET BY MOUTH DAILY AS NEEDED FOR ANXIETY 30 tablet 1  . aspirin 325 MG tablet Take 325 mg by mouth daily.    . metoprolol succinate (TOPROL-XL)  50 MG 24 hr tablet Take 25 mg by mouth daily. Take with or immediately following a meal.     No current facility-administered medications for this visit.      Allergies:   Patient has no known allergies.   Social History:  The patient  reports that she has never smoked. She has never used smokeless tobacco. She reports that she drinks alcohol. She reports that she does not use drugs.   Family History:   family history includes Cancer in her other; Dementia in her father; Diabetes in her father; Heart disease in her mother; Heart failure in her father.    Review of Systems: Review of Systems  Constitutional: Negative.   Respiratory: Negative.   Cardiovascular: Negative.   Gastrointestinal: Negative.   Musculoskeletal: Negative.   Neurological: Negative.   Psychiatric/Behavioral: Negative.   All other systems reviewed and are negative.    PHYSICAL EXAM: VS:  BP (!) 142/82 (BP Location: Right Arm, Patient Position: Sitting, Cuff Size: Normal)   Pulse 81   Ht 4' 11.5" (1.511 m)   Wt 126 lb (57.2 kg)   BMI 25.02 kg/m  , BMI Body mass index is 25.02 kg/m. GEN: Well nourished, well developed, in no acute distress  HEENT: normal  Neck: no JVD, carotid bruits, or masses Cardiac: RRR; no murmurs, rubs, or gallops,no edema  Respiratory:  clear to auscultation bilaterally, normal work of breathing GI: soft, nontender, nondistended, + BS MS:  no deformity or atrophy  Skin: warm and dry, no rash Neuro:  Strength and sensation are intact Psych: euthymic mood, full affect    Recent Labs: 03/19/2017: ALT 18; BUN 15; Creatinine, Ser 0.76; Hemoglobin 15.1; Platelets 283.0; Potassium 4.2; Sodium 140; TSH 2.73    Lipid Panel Lab Results  Component Value Date   CHOL 164 03/19/2017   HDL 32.40 (L) 03/19/2017   LDLCALC 109 (H) 03/19/2017   TRIG 110.0 03/19/2017      Wt Readings from Last 3 Encounters:  05/13/17 126 lb (57.2 kg)  03/10/17 125 lb 9.6 oz (57 kg)  10/16/16 121 lb  6.4 oz (55.1 kg)       ASSESSMENT AND PLAN:  Mixed hyperlipidemia - Plan: EKG 12-Lead, CT CARDIAC SCORING Recently relatively well-controlled cholesterol but unable to tolerate Lipitor  We will proceed as below  Essential hypertension - Plan: EKG 12-Lead, CT CARDIAC SCORING Blood pressure is well controlled on today's visit. No changes made to the medications.  Family history of coronary artery disease - Plan: EKG 12-Lead, CT CARDIAC SCORING Many of her family members were smokers, had accelerated coronary disease Melissa Turner has no history of smoking Risk stratification as below  Encounter for preventive health examination Long discussion concerning family history, cholesterol/hyperlipidemia Risk stratification techniques/studies Recommended CT coronary calcium scoring for risk stratification If score 0, may not need a cholesterol medication. She is currently having myalgias If score is elevated, may need to try alternate statin   Disposition:   F/U 12 months as needed   Total encounter time more than 45 minutes  Greater than 50% was spent in counseling and coordination of care with the patient    Orders Placed This Encounter  Procedures  . CT CARDIAC SCORING  . EKG 12-Lead     Signed, Melissa Turner, M.D., Ph.D. 05/13/2017  [place], [place]

## 2017-05-12 ENCOUNTER — Ambulatory Visit
Admission: RE | Admit: 2017-05-12 | Discharge: 2017-05-12 | Disposition: A | Discharge status: Home / Self Care | Payer: PPO | Source: Ambulatory Visit | Attending: Family | Admitting: Family

## 2017-05-12 DIAGNOSIS — M85852 Other specified disorders of bone density and structure, left thigh: Secondary | ICD-10-CM | POA: Diagnosis not present

## 2017-05-12 DIAGNOSIS — Z1231 Encounter for screening mammogram for malignant neoplasm of breast: Secondary | ICD-10-CM | POA: Diagnosis not present

## 2017-05-12 DIAGNOSIS — Z13820 Encounter for screening for osteoporosis: Secondary | ICD-10-CM | POA: Diagnosis not present

## 2017-05-12 DIAGNOSIS — Z0000 Encounter for general adult medical examination without abnormal findings: Secondary | ICD-10-CM

## 2017-05-12 DIAGNOSIS — Z780 Asymptomatic menopausal state: Secondary | ICD-10-CM | POA: Diagnosis not present

## 2017-05-13 ENCOUNTER — Ambulatory Visit (INDEPENDENT_AMBULATORY_CARE_PROVIDER_SITE_OTHER): Payer: PPO | Admitting: Cardiovascular Disease

## 2017-05-13 ENCOUNTER — Encounter: Payer: Self-pay | Admitting: Cardiovascular Disease

## 2017-05-13 VITALS — BP 142/82 | HR 81 | Ht 59.5 in | Wt 126.0 lb

## 2017-05-13 DIAGNOSIS — I10 Essential (primary) hypertension: Secondary | ICD-10-CM

## 2017-05-13 DIAGNOSIS — Z0000 Encounter for general adult medical examination without abnormal findings: Secondary | ICD-10-CM | POA: Diagnosis not present

## 2017-05-13 DIAGNOSIS — Z8249 Family history of ischemic heart disease and other diseases of the circulatory system: Secondary | ICD-10-CM

## 2017-05-13 DIAGNOSIS — E782 Mixed hyperlipidemia: Secondary | ICD-10-CM | POA: Diagnosis not present

## 2017-05-13 NOTE — Patient Instructions (Signed)
Medication Instructions:   No medication changes made  Labwork:  No new labs needed  Testing/Procedures:  We will order a CT coronary calcium score for family hx of CAD, hyperlipidemia $150   Follow-Up: It was a pleasure seeing you in the office today. Please call us if you have new issues that need to be addressed before your next appt.  [phone]  Your physician wants you to follow-up in:  As needed  If you need a refill on your cardiac medications before your next appointment, please call your pharmacy.

## 2017-05-21 DIAGNOSIS — E559 Vitamin D deficiency, unspecified: Secondary | ICD-10-CM | POA: Diagnosis not present

## 2017-05-25 ENCOUNTER — Other Ambulatory Visit: Payer: Self-pay | Admitting: Internal Medicine

## 2017-05-26 ENCOUNTER — Ambulatory Visit (INDEPENDENT_AMBULATORY_CARE_PROVIDER_SITE_OTHER)
Admission: RE | Admit: 2017-05-26 | Discharge: 2017-05-26 | Disposition: A | Discharge status: Home / Self Care | Payer: Self-pay | Source: Ambulatory Visit | Attending: Cardiovascular Disease | Admitting: Cardiovascular Disease

## 2017-05-26 DIAGNOSIS — Z8249 Family history of ischemic heart disease and other diseases of the circulatory system: Secondary | ICD-10-CM

## 2017-05-26 DIAGNOSIS — I10 Essential (primary) hypertension: Secondary | ICD-10-CM

## 2017-05-26 DIAGNOSIS — E782 Mixed hyperlipidemia: Secondary | ICD-10-CM

## 2017-05-28 DIAGNOSIS — M8589 Other specified disorders of bone density and structure, multiple sites: Secondary | ICD-10-CM | POA: Diagnosis not present

## 2017-05-28 DIAGNOSIS — Z780 Asymptomatic menopausal state: Secondary | ICD-10-CM | POA: Diagnosis not present

## 2017-06-01 ENCOUNTER — Telehealth: Payer: Self-pay | Admitting: Cardiovascular Disease

## 2017-06-01 DIAGNOSIS — E785 Hyperlipidemia, unspecified: Secondary | ICD-10-CM

## 2017-06-01 NOTE — Telephone Encounter (Signed)
No answer. Left message to call back.   

## 2017-06-01 NOTE — Telephone Encounter (Signed)
Patient returning call for test results please call

## 2017-06-02 MED ORDER — ROSUVASTATIN CALCIUM 5 MG PO TABS: 5.0000 mg | ORAL_TABLET | Freq: Every day | ORAL | 3 refills | Status: DC

## 2017-06-02 NOTE — Telephone Encounter (Signed)
Reviewed results and recommendations with patient. Reviewed in detail results and she verbalized understanding. Discussed starting Crestor 5 mg taking every other day and then in one month increasing to once daily and then having repeat labs done. She verbalized understanding of our conversation, agreement with plan, and had no further questions at this time.

## 2017-06-02 NOTE — Telephone Encounter (Signed)
-----   Message from Minna Merritts, MD sent at 05/29/2017 12:35 PM EDT ----- CT coronary calcium score Score is elevated for her age 76 of 74 people her age, she has more than 20 other people Would consider retrying alternate cholesterol medication Suggest generic crestor lowest dose 5 every other day, After one month, go to once a day Recheck lipids in 3 months Goal LDL <70

## 2017-06-07 DIAGNOSIS — N812 Incomplete uterovaginal prolapse: Secondary | ICD-10-CM | POA: Diagnosis not present

## 2017-06-22 DIAGNOSIS — L578 Other skin changes due to chronic exposure to nonionizing radiation: Secondary | ICD-10-CM | POA: Diagnosis not present

## 2017-06-22 DIAGNOSIS — L820 Inflamed seborrheic keratosis: Secondary | ICD-10-CM | POA: Diagnosis not present

## 2017-06-22 DIAGNOSIS — L570 Actinic keratosis: Secondary | ICD-10-CM | POA: Diagnosis not present

## 2017-06-25 ENCOUNTER — Ambulatory Visit: Payer: Medicare Other

## 2017-07-01 ENCOUNTER — Telehealth: Payer: Self-pay | Admitting: Cardiovascular Disease

## 2017-07-01 NOTE — Telephone Encounter (Signed)
Patients primary care would have to send that in since they upped the dose. Would you like to call patient and let then know or would you like me too?

## 2017-07-01 NOTE — Telephone Encounter (Signed)
Pt calling stating her PCP Mable Paris upped the dose on Rosuvastatin  from 10 mg to 20 mg daily   Would like a new prescription sent in with new dose   Please send to [facility] in [place] with a 90 day supply

## 2017-07-01 NOTE — Telephone Encounter (Signed)
Lmov for patient to be aware  pcp will fill medication

## 2017-07-02 ENCOUNTER — Telehealth: Payer: Self-pay | Admitting: Family

## 2017-07-02 NOTE — Telephone Encounter (Signed)
Copied from [place] [id number]. Topic: General - Other >> Jul 02, 2017  9:03 AM [name], RMA wrote: Reason for CRM: patient called stating she needs her medication changed from Atorvastatin 10 mg to Crestor 20 mg due to pt was asked by Dr. Vidal Schwalbe to up her dosage of Atorvastatin from 10 mg to 20 mg but when pt went to Cardiologist she was asked to call her PCP and ask to be changed to Crestor 20 mg, please call pt and advise pt

## 2017-07-02 NOTE — Telephone Encounter (Signed)
Ok to do so? Please advise. 

## 2017-07-05 NOTE — Telephone Encounter (Signed)
Call pt  Think there is some confusion. She is ONLY on the crestor now.  Reviewed correspondence with Pam from Dr Donivan Scull office and he advised  On 10/9:   Discussed starting rosuvastatin (Crestor 5 mg taking every other day and then in one month increasing to once daily and then having repeat labs done.   She does not need to be atorvastatin - this is Lipitor.

## 2017-07-05 NOTE — Telephone Encounter (Signed)
Call pt  Think there is some confusion. She is ONLY on the crestor now.   Reviewed correspondence with Pam from Dr Donivan Scull office and he advised  On 10/9:   Discussed starting rosuvastatin (Crestor 5 mg taking every other day and then in one month increasing to once daily and then having repeat labs done.   She does not need to be atorvastatin - this is Lipitor. She had been on this during the summer when I did her lipids - at that time, I did increase it however now this would be duplicate therapy   ONLY crestor 5mg  from dr Donivan Scull office

## 2017-07-06 ENCOUNTER — Encounter: Payer: Self-pay | Admitting: Family

## 2017-07-06 NOTE — Telephone Encounter (Signed)
FYI

## 2017-07-06 NOTE — Telephone Encounter (Signed)
LMTCB

## 2017-07-06 NOTE — Telephone Encounter (Signed)
Left message with patients husband to contact [place] back.

## 2017-07-06 NOTE — Telephone Encounter (Signed)
Spoke   With  Patient  She   She  Has  Been taking  20  Mg  crestor  Daily  Since  approx  Oct  3    Last  Does  2  Days  Ago  Out of  meds  Denies  Any  Symptoms

## 2017-07-06 NOTE — Telephone Encounter (Signed)
I am very confused with so many messages  Bottom line- patient needs to only be on 5mg  crestor. Does she understand this?  I would advise cholesterol lab in a couple of months. We did them in July. I have pended them.

## 2017-07-06 NOTE — Telephone Encounter (Signed)
Patient is not having any symptoms. She spoke with the nurses at the [facility]. She has been taking 20 mg daily since Oct 3. She is out of the medication. Do you want her to be evaluated or come in to have labs done?

## 2017-07-06 NOTE — Telephone Encounter (Signed)
Pt   Called  Back   And   Advised  That  The  Office    Would  Be  Getting  In  Contact   With  Her    Pt  Denies   Any  Symptoms

## 2017-07-08 NOTE — Telephone Encounter (Signed)
LMTCB. CRM started.

## 2017-07-08 NOTE — Telephone Encounter (Signed)
Patient scheduled for labs

## 2017-07-08 NOTE — Telephone Encounter (Signed)
Closing the loop here- tell pt that we can check CMP in a couple of weeks - pended . She was still on an acceptable dose though likely high for her.  Hopefully cleared up for her now:)

## 2017-07-09 NOTE — Telephone Encounter (Signed)
noted 

## 2017-07-13 ENCOUNTER — Other Ambulatory Visit (INDEPENDENT_AMBULATORY_CARE_PROVIDER_SITE_OTHER): Payer: PPO

## 2017-07-13 ENCOUNTER — Other Ambulatory Visit: Payer: PPO

## 2017-07-13 LAB — COMPREHENSIVE METABOLIC PANEL
ALT: 30 U/L (ref 0–35)
AST: 27 U/L (ref 0–37)
Albumin: 5.1 g/dL (ref 3.5–5.2)
Alkaline Phosphatase: 83 U/L (ref 39–117)
BILIRUBIN TOTAL: 0.9 mg/dL (ref 0.2–1.2)
BUN: 13 mg/dL (ref 6–23)
CALCIUM: 11.2 mg/dL — AB (ref 8.4–10.5)
CO2: 31 mEq/L (ref 19–32)
CREATININE: 0.71 mg/dL (ref 0.40–1.20)
Chloride: 100 mEq/L (ref 96–112)
GFR: 85.41 mL/min (ref >60.00)
Glucose, Bld: 104 mg/dL — ABNORMAL HIGH (ref 70–99)
Potassium: 3.8 mEq/L (ref 3.5–5.1)
Sodium: 137 mEq/L (ref 135–145)
TOTAL PROTEIN: 7.9 g/dL (ref 6.0–8.3)

## 2017-07-14 LAB — PTH, INTACT AND CALCIUM
CALCIUM: 10.8 mg/dL — AB (ref 8.6–10.4)
PTH: 73 pg/mL — ABNORMAL HIGH (ref 14–64)

## 2017-07-19 ENCOUNTER — Other Ambulatory Visit: Payer: Self-pay | Admitting: Family

## 2017-07-19 DIAGNOSIS — F419 Anxiety disorder, unspecified: Secondary | ICD-10-CM

## 2017-07-20 ENCOUNTER — Other Ambulatory Visit: Payer: Self-pay | Admitting: Internal Medicine

## 2017-07-20 ENCOUNTER — Telehealth: Payer: Self-pay | Admitting: *Deleted

## 2017-07-20 NOTE — Progress Notes (Signed)
Order placed for endocrinology referral.  

## 2017-07-20 NOTE — Telephone Encounter (Signed)
Copied from [place] [phone]. Topic: General - Other >> Jul 20, 2017  4:02 PM Marin Olp L wrote: Reason for CRM: Returning missed call from Garnette Scheuermann to clarify that she is not taking Crestor 5 mg daily for the last couple of weeks.

## 2017-07-21 NOTE — Telephone Encounter (Signed)
LMTCB

## 2017-07-21 NOTE — Telephone Encounter (Signed)
Spoke with patient. She increased Crestor to 20 mg a day on her own because her and Maragret had discussed it. She is aware she was only supposed to be taking 5 mg and that Dr.Gollan prescribed the medication so she should have followed his instructions. Patient is also aware that her medication had refills on it so she is calling pharmacy to check on early refill due to all the confusion

## 2017-07-22 DIAGNOSIS — L578 Other skin changes due to chronic exposure to nonionizing radiation: Secondary | ICD-10-CM | POA: Diagnosis not present

## 2017-07-22 DIAGNOSIS — L821 Other seborrheic keratosis: Secondary | ICD-10-CM | POA: Diagnosis not present

## 2017-07-22 DIAGNOSIS — L820 Inflamed seborrheic keratosis: Secondary | ICD-10-CM | POA: Diagnosis not present

## 2017-07-22 DIAGNOSIS — L700 Acne vulgaris: Secondary | ICD-10-CM | POA: Diagnosis not present

## 2017-07-27 ENCOUNTER — Other Ambulatory Visit: Payer: PPO

## 2017-09-10 DIAGNOSIS — N393 Stress incontinence (female) (male): Secondary | ICD-10-CM | POA: Diagnosis not present

## 2017-09-10 DIAGNOSIS — R399 Unspecified symptoms and signs involving the genitourinary system: Secondary | ICD-10-CM | POA: Diagnosis not present

## 2017-09-10 DIAGNOSIS — N814 Uterovaginal prolapse, unspecified: Secondary | ICD-10-CM | POA: Diagnosis not present

## 2017-09-15 DIAGNOSIS — N814 Uterovaginal prolapse, unspecified: Secondary | ICD-10-CM | POA: Insufficient documentation

## 2017-09-22 DIAGNOSIS — H2513 Age-related nuclear cataract, bilateral: Secondary | ICD-10-CM | POA: Diagnosis not present

## 2017-09-22 DIAGNOSIS — M7522 Bicipital tendinitis, left shoulder: Secondary | ICD-10-CM | POA: Diagnosis not present

## 2017-09-26 DIAGNOSIS — I2584 Coronary atherosclerosis due to calcified coronary lesion: Secondary | ICD-10-CM

## 2017-09-26 DIAGNOSIS — I2510 Atherosclerotic heart disease of native coronary artery without angina pectoris: Secondary | ICD-10-CM | POA: Insufficient documentation

## 2017-09-26 NOTE — Progress Notes (Signed)
Cardiology Office Note  Date:  09/29/2017   ID:  Adrian Specht Wolfley, DOB 1940-11-26, MRN [id number]  PCP:  Burnard Hawthorne, FNP   Chief Complaint  Patient presents with  . other    Follow up from CT cardiac score. Meds reviewed by the pt. verbally. "doing well."     HPI:  Melissa Turner is a 77 year old woman with history of nonsmoker HTN Hyperlipidemia Elevated CT coronary calcium score 104 on scan October 2018 Who presents for follow-up of her coronary artery disease  Calcium score 100, images reviewed with her in detail Images pulled up in the office  Reports that she has changed her diet, weight down at least 5 pounds to 8 pounds Active  Blood pressure stable on current medications Previously on Lipitor 10 mg daily and LDL was more than 100 Was having myalgias on Lipitor Changed to Crestor 5 Tolerating this fine without any myalgias  Hemoglobin A1c 5.6  Other past medical history reviewed Family history reviewed on today's visit Mom with CAD age 52, PCI, smoker Father possible CHF, diabetes GM with MI, 72, died of CVA, smoker  PMH:   has a past medical history of Deviated septum, GERD (gastroesophageal reflux disease), History of blood transfusion, History of chicken pox, HTN (hypertension), Hypercholesteremia, and Migraines.  PSH:    Past Surgical History:  Procedure Laterality Date  . BREAST CYST ASPIRATION Left   . BUNIONECTOMY    . DILATION AND CURETTAGE OF UTERUS    . RHINOPLASTY    . TONSILLECTOMY  1950  . VAGINAL DELIVERY     x2    Current Outpatient Medications  Medication Sig Dispense Refill  . ALPRAZolam (XANAX) 0.25 MG tablet TAKE 1 TABLET BY MOUTH DAILY AS NEEDED FOR ANXIETY 30 tablet 0  . aspirin 325 MG tablet Take 325 mg by mouth daily.    . metoprolol succinate (TOPROL-XL) 50 MG 24 hr tablet Take 25 mg by mouth daily. Take with or immediately following a meal.    . rosuvastatin (CRESTOR) 5 MG tablet Take 1 tablet (5 mg total) by mouth daily. 90  tablet 3   No current facility-administered medications for this visit.      Allergies:   Other   Social History:  The patient  reports that  has never smoked. she has never used smokeless tobacco. She reports that she drinks alcohol. She reports that she does not use drugs.   Family History:   family history includes Cancer in her other; Dementia in her father; Diabetes in her father; Heart disease in her mother; Heart failure in her father.    Review of Systems: Review of Systems  Constitutional: Negative.   Respiratory: Negative.   Cardiovascular: Negative.   Gastrointestinal: Negative.   Musculoskeletal: Negative.   Neurological: Negative.   Psychiatric/Behavioral: Negative.   All other systems reviewed and are negative.    PHYSICAL EXAM: VS:  BP 140/70 (BP Location: Left Arm, Patient Position: Sitting, Cuff Size: Normal)   Pulse 82   Ht 5' (1.524 m)   Wt 121 lb 8 oz (55.1 kg)   BMI 23.73 kg/m  , BMI Body mass index is 23.73 kg/m. Constitutional:  oriented to person, place, and time. No distress.  HENT:  Head: Normocephalic and atraumatic.  Eyes:  no discharge. No scleral icterus.  Neck: Normal range of motion. Neck supple. No JVD present.  Cardiovascular: Normal rate, regular rhythm, normal heart sounds and intact distal pulses. Exam reveals no gallop and no friction  rub. No edema No murmur heard. Pulmonary/Chest: Effort normal and breath sounds normal. No stridor. No respiratory distress.  no wheezes.  no rales.  no tenderness.  Abdominal: Soft.  no distension.  no tenderness.  Musculoskeletal: Normal range of motion.  no  tenderness or deformity.  Neurological:  normal muscle tone. Coordination normal. No atrophy Skin: Skin is warm and dry. No rash noted. not diaphoretic.  Psychiatric:  normal mood and affect. behavior is normal. Thought content normal.       Recent Labs: 03/19/2017: Hemoglobin 15.1; Platelets 283.0; TSH 2.73 07/13/2017: ALT 30; BUN 13;  Creatinine, Ser 0.71; Potassium 3.8; Sodium 137    Lipid Panel Lab Results  Component Value Date   CHOL 164 03/19/2017   HDL 32.40 (L) 03/19/2017   LDLCALC 109 (H) 03/19/2017   TRIG 110.0 03/19/2017      Wt Readings from Last 3 Encounters:  09/29/17 121 lb 8 oz (55.1 kg)  05/13/17 126 lb (57.2 kg)  03/10/17 125 lb 9.6 oz (57 kg)       ASSESSMENT AND PLAN:  Mixed hyperlipidemia -  Tolerating Crestor 5 mg daily We will increase up to Crestor 10 mg daily repeat lipid panel 2-3 months Could add Zetia if needed Goal LDL less than 70  Essential hypertension - Blood pressure is well controlled on today's visit. No changes made to the medications. Stable  Cad Seen on CT scan chest, elevated coronary calcium score, images pulled up in the office and discussed with her today  Disposition:   F/U 12 months as needed   Total encounter time more than 25 minutes  Greater than 50% was spent in counseling and coordination of care with the patient    No orders of the defined types were placed in this encounter.    Signed, Esmond Plants, M.D., Ph.D. 09/29/2017  [place], [place]

## 2017-09-29 ENCOUNTER — Encounter: Payer: Self-pay | Admitting: Cardiovascular Disease

## 2017-09-29 ENCOUNTER — Ambulatory Visit: Payer: PPO | Admitting: Cardiovascular Disease

## 2017-09-29 VITALS — BP 140/70 | HR 82 | Ht 60.0 in | Wt 121.5 lb

## 2017-09-29 DIAGNOSIS — I2510 Atherosclerotic heart disease of native coronary artery without angina pectoris: Secondary | ICD-10-CM | POA: Diagnosis not present

## 2017-09-29 DIAGNOSIS — I2584 Coronary atherosclerosis due to calcified coronary lesion: Secondary | ICD-10-CM

## 2017-09-29 DIAGNOSIS — I10 Essential (primary) hypertension: Secondary | ICD-10-CM

## 2017-09-29 DIAGNOSIS — E785 Hyperlipidemia, unspecified: Secondary | ICD-10-CM | POA: Diagnosis not present

## 2017-09-29 MED ORDER — ROSUVASTATIN CALCIUM 10 MG PO TABS: 10.0000 mg | ORAL_TABLET | Freq: Every day | ORAL | 3 refills | Status: DC

## 2017-09-29 NOTE — Patient Instructions (Signed)
Medication Instructions:   Increase the crestor up to 10 mg daily  Goal LDL <70  Labwork:  No new labs needed  Testing/Procedures:  Liver and lipids in 2 to 3 months   Follow-Up: It was a pleasure seeing you in the office today. Please call us if you have new issues that need to be addressed before your next appt.  [phone]  Your physician wants you to follow-up in: 12 months.  You will receive a reminder letter in the mail two months in advance. If you don't receive a letter, please call our office to schedule the follow-up appointment.  If you need a refill on your cardiac medications before your next appointment, please call your pharmacy.

## 2017-10-05 ENCOUNTER — Telehealth: Payer: Self-pay | Admitting: Family

## 2017-10-10 NOTE — Telephone Encounter (Signed)
close

## 2017-10-28 ENCOUNTER — Telehealth: Payer: Self-pay | Admitting: Family

## 2017-10-28 DIAGNOSIS — R319 Hematuria, unspecified: Secondary | ICD-10-CM | POA: Diagnosis not present

## 2017-10-28 DIAGNOSIS — Z01818 Encounter for other preprocedural examination: Secondary | ICD-10-CM | POA: Diagnosis not present

## 2017-10-28 NOTE — Telephone Encounter (Signed)
Copied from [place] [id number]. Topic: Quick Communication - See Telephone Encounter >> Oct 28, 2017  9:50 AM Melissa Turner wrote: CRM for notification. See Telephone encounter for:  Patient having bladder surgery for prolapsed bladder. Ok for clearance? 10/28/17.

## 2017-11-04 DIAGNOSIS — R32 Unspecified urinary incontinence: Secondary | ICD-10-CM | POA: Diagnosis not present

## 2017-11-04 DIAGNOSIS — E210 Primary hyperparathyroidism: Secondary | ICD-10-CM | POA: Diagnosis not present

## 2017-11-11 DIAGNOSIS — L718 Other rosacea: Secondary | ICD-10-CM | POA: Diagnosis not present

## 2017-11-11 DIAGNOSIS — L578 Other skin changes due to chronic exposure to nonionizing radiation: Secondary | ICD-10-CM | POA: Diagnosis not present

## 2017-11-22 HISTORY — PX: ABDOMINAL HYSTERECTOMY: SHX81

## 2017-11-23 DIAGNOSIS — F419 Anxiety disorder, unspecified: Secondary | ICD-10-CM | POA: Diagnosis not present

## 2017-11-23 DIAGNOSIS — E7800 Pure hypercholesterolemia, unspecified: Secondary | ICD-10-CM | POA: Diagnosis not present

## 2017-11-23 DIAGNOSIS — Z7982 Long term (current) use of aspirin: Secondary | ICD-10-CM | POA: Diagnosis not present

## 2017-11-23 DIAGNOSIS — I10 Essential (primary) hypertension: Secondary | ICD-10-CM | POA: Diagnosis not present

## 2017-11-23 DIAGNOSIS — N393 Stress incontinence (female) (male): Secondary | ICD-10-CM | POA: Diagnosis not present

## 2017-11-23 DIAGNOSIS — N840 Polyp of corpus uteri: Secondary | ICD-10-CM | POA: Diagnosis not present

## 2017-11-23 DIAGNOSIS — Z79899 Other long term (current) drug therapy: Secondary | ICD-10-CM | POA: Diagnosis not present

## 2017-11-23 DIAGNOSIS — N8111 Cystocele, midline: Secondary | ICD-10-CM | POA: Diagnosis not present

## 2017-11-23 DIAGNOSIS — D251 Intramural leiomyoma of uterus: Secondary | ICD-10-CM | POA: Diagnosis not present

## 2017-11-23 DIAGNOSIS — N814 Uterovaginal prolapse, unspecified: Secondary | ICD-10-CM | POA: Diagnosis not present

## 2017-11-23 DIAGNOSIS — N816 Rectocele: Secondary | ICD-10-CM | POA: Diagnosis not present

## 2017-11-23 DIAGNOSIS — K219 Gastro-esophageal reflux disease without esophagitis: Secondary | ICD-10-CM | POA: Diagnosis not present

## 2017-11-23 DIAGNOSIS — Z01810 Encounter for preprocedural cardiovascular examination: Secondary | ICD-10-CM | POA: Diagnosis not present

## 2017-11-23 DIAGNOSIS — N800 Endometriosis of uterus: Secondary | ICD-10-CM | POA: Diagnosis not present

## 2018-03-16 ENCOUNTER — Ambulatory Visit: Payer: PPO

## 2018-03-16 ENCOUNTER — Encounter: Payer: PPO | Admitting: Family

## 2018-03-18 ENCOUNTER — Other Ambulatory Visit: Payer: Self-pay | Admitting: Family

## 2018-03-18 DIAGNOSIS — F419 Anxiety disorder, unspecified: Secondary | ICD-10-CM

## 2018-03-18 MED ORDER — ALPRAZOLAM 0.25 MG PO TABS
0.2500 mg | ORAL_TABLET | Freq: Every day | ORAL | 1 refills | Status: DC | PRN
Start: 2018-03-18 — End: 2018-08-03

## 2018-03-18 NOTE — Telephone Encounter (Signed)
Call pt  I refilled xanax for pt.  Please education patient. This is controlled substance;  In order for me to prescribe medication,  Patient must be seen every 3-6 months. please make follow-up appointment as last saw her 02/2017. No further refills until f/u appt.    I looked up patient on [place] Controlled Substances Reporting System and saw no activity that raised concern of inappropriate use.

## 2018-03-29 NOTE — Telephone Encounter (Signed)
Appointment 06/22/18

## 2018-03-30 DIAGNOSIS — M7542 Impingement syndrome of left shoulder: Secondary | ICD-10-CM | POA: Diagnosis not present

## 2018-03-30 DIAGNOSIS — M5416 Radiculopathy, lumbar region: Secondary | ICD-10-CM | POA: Diagnosis not present

## 2018-04-15 ENCOUNTER — Other Ambulatory Visit: Payer: Self-pay | Admitting: Family

## 2018-04-15 DIAGNOSIS — Z1231 Encounter for screening mammogram for malignant neoplasm of breast: Secondary | ICD-10-CM

## 2018-05-04 DIAGNOSIS — L821 Other seborrheic keratosis: Secondary | ICD-10-CM | POA: Diagnosis not present

## 2018-05-04 DIAGNOSIS — L820 Inflamed seborrheic keratosis: Secondary | ICD-10-CM | POA: Diagnosis not present

## 2018-05-04 DIAGNOSIS — L578 Other skin changes due to chronic exposure to nonionizing radiation: Secondary | ICD-10-CM | POA: Diagnosis not present

## 2018-05-04 DIAGNOSIS — L570 Actinic keratosis: Secondary | ICD-10-CM | POA: Diagnosis not present

## 2018-05-13 ENCOUNTER — Ambulatory Visit
Admission: RE | Admit: 2018-05-13 | Discharge: 2018-05-13 | Disposition: A | Discharge status: Home / Self Care | Payer: PPO | Source: Ambulatory Visit | Attending: Family | Admitting: Family

## 2018-05-13 DIAGNOSIS — Z1231 Encounter for screening mammogram for malignant neoplasm of breast: Secondary | ICD-10-CM | POA: Diagnosis not present

## 2018-06-03 DIAGNOSIS — M8589 Other specified disorders of bone density and structure, multiple sites: Secondary | ICD-10-CM | POA: Diagnosis not present

## 2018-06-03 DIAGNOSIS — L659 Nonscarring hair loss, unspecified: Secondary | ICD-10-CM | POA: Diagnosis not present

## 2018-06-03 DIAGNOSIS — E210 Primary hyperparathyroidism: Secondary | ICD-10-CM | POA: Diagnosis not present

## 2018-06-03 DIAGNOSIS — Z780 Asymptomatic menopausal state: Secondary | ICD-10-CM | POA: Diagnosis not present

## 2018-06-06 ENCOUNTER — Telehealth: Payer: Self-pay | Admitting: Family

## 2018-06-06 ENCOUNTER — Encounter: Payer: Self-pay | Admitting: Family

## 2018-06-06 DIAGNOSIS — E210 Primary hyperparathyroidism: Secondary | ICD-10-CM

## 2018-06-06 NOTE — Telephone Encounter (Signed)
Sent mychart

## 2018-06-17 ENCOUNTER — Encounter: Payer: PPO | Admitting: Family

## 2018-06-17 ENCOUNTER — Ambulatory Visit: Payer: PPO

## 2018-06-20 ENCOUNTER — Other Ambulatory Visit: Payer: Self-pay | Admitting: Family

## 2018-06-20 DIAGNOSIS — Z0000 Encounter for general adult medical examination without abnormal findings: Secondary | ICD-10-CM

## 2018-06-20 NOTE — Telephone Encounter (Signed)
Call pt  She may request toprol for Dr Rockey Situ who has seen her this year for HTN.    I havent seen her in over year. For me to prescribe medications, she needs at least an annual appointment.   I can no longer prescribe xanax since controlled substance and she needs to be seen every 3-4 months for this.   She can may a f/u appt

## 2018-06-20 NOTE — Telephone Encounter (Signed)
Last office visit 03/10/17 No office visit scheduled Last filled 06/20/18

## 2018-06-22 ENCOUNTER — Encounter: Payer: Self-pay | Admitting: Family

## 2018-06-22 ENCOUNTER — Ambulatory Visit (INDEPENDENT_AMBULATORY_CARE_PROVIDER_SITE_OTHER): Payer: PPO | Admitting: Family

## 2018-06-22 VITALS — BP 150/90 | HR 80 | Temp 98.0°F | Resp 15 | Ht 59.0 in | Wt 118.5 lb

## 2018-06-22 DIAGNOSIS — I10 Essential (primary) hypertension: Secondary | ICD-10-CM

## 2018-06-22 DIAGNOSIS — L659 Nonscarring hair loss, unspecified: Secondary | ICD-10-CM

## 2018-06-22 DIAGNOSIS — E785 Hyperlipidemia, unspecified: Secondary | ICD-10-CM | POA: Diagnosis not present

## 2018-06-22 DIAGNOSIS — Z0000 Encounter for general adult medical examination without abnormal findings: Secondary | ICD-10-CM | POA: Diagnosis not present

## 2018-06-22 LAB — CBC WITH DIFFERENTIAL/PLATELET
Basophils Absolute: 0 10*3/uL (ref 0.0–0.1)
Basophils Relative: 0.7 % (ref 0.0–3.0)
EOS PCT: 2.8 % (ref 0.0–5.0)
Eosinophils Absolute: 0.2 10*3/uL (ref 0.0–0.7)
HEMATOCRIT: 43.7 % (ref 36.0–46.0)
HEMOGLOBIN: 15.1 g/dL — AB (ref 12.0–15.0)
LYMPHS PCT: 24 % (ref 12.0–46.0)
Lymphs Abs: 1.7 10*3/uL (ref 0.7–4.0)
MCHC: 34.5 g/dL (ref 30.0–36.0)
MCV: 95.3 fl (ref 78.0–100.0)
Monocytes Absolute: 0.5 10*3/uL (ref 0.1–1.0)
Monocytes Relative: 7.9 % (ref 3.0–12.0)
Neutro Abs: 4.5 10*3/uL (ref 1.4–7.7)
Neutrophils Relative %: 64.6 % (ref 43.0–77.0)
Platelets: 252 10*3/uL (ref 150.0–400.0)
RBC: 4.58 Mil/uL (ref 3.87–5.11)
RDW: 12.8 % (ref 11.5–15.5)
WBC: 6.9 10*3/uL (ref 4.0–10.5)

## 2018-06-22 LAB — LIPID PANEL
Cholesterol: 139 mg/dL (ref 0–200)
HDL: 39.2 mg/dL (ref >39.00)
LDL CALC: 77 mg/dL (ref 0–99)
NONHDL: 99.37
Total CHOL/HDL Ratio: 4
Triglycerides: 112 mg/dL (ref 0.0–149.0)
VLDL: 22.4 mg/dL (ref 0.0–40.0)

## 2018-06-22 LAB — VITAMIN D 25 HYDROXY (VIT D DEFICIENCY, FRACTURES): VITD: 37.61 ng/mL (ref 30.00–100.00)

## 2018-06-22 LAB — HEMOGLOBIN A1C: Hgb A1c MFr Bld: 5.6 % (ref 4.6–6.5)

## 2018-06-22 LAB — TSH: TSH: 2.12 u[IU]/mL (ref 0.35–4.50)

## 2018-06-22 NOTE — Assessment & Plan Note (Addendum)
Mildly elevated. Home values are better controlled.  Asymptomatic.  Patient will send values from home and  We will titrate medications from there.

## 2018-06-22 NOTE — Progress Notes (Signed)
Subjective:    Patient ID: Melissa Turner, female    DOB: [birth year], 77 y.o.   MRN: [id number]  CC: Melissa Turner is a 77 y.o. female who presents today for physical exam.    HPI: Complains of hair thinning 6 months, unsure if has leveled off or is worsening.  No patches or bald spots. Mother didn't have hair loss; father's hair was thin.  Would like TSH to be checked.  Follows with Dr Gabriel Carina for hyperparathyroidism. On vitamin D however has held the biotin, vitmain D today prior to TSH test.  [place] -  06/03/18- reviewed note. Plans to see her again in 6 months.   HLD- Calcium scoring 2018. On crestor 10mg  QD.   HTN-BP at home 137/80.   Denies exertional chest pain or pressure, numbness or tingling radiating to left arm or jaw, palpitations, dizziness, frequent headaches, changes in vision, or shortness of breath.     Colorectal Cancer Screening: due Breast Cancer Screening: Mammogram UTD Cervical Cancer Screening: h/o partial hysterectomy due to bladder and uterine prolapse; urinary incontinence has resolved.  Bone Health screening/DEXA for 65+: done 2018 Lung Cancer Screening: Doesn't have 30 year pack year history and age > 23 years.       Tetanus - due; she will check her records.     Labs: Screening labs today. Exercise: Gets regular exercise at gym.  Alcohol use: 2-3 times per week. One glass of wine Smoking/tobacco use: Nonsmoker.  Wears seat belt: Yes.  HISTORY:  Past Medical History:  Diagnosis Date  . Deviated septum    s/p rhinoplasty  . GERD (gastroesophageal reflux disease)   . History of blood transfusion    during first child's birth  . History of chicken pox   . HTN (hypertension)   . Hypercholesteremia   . Migraines     Past Surgical History:  Procedure Laterality Date  . ABDOMINAL HYSTERECTOMY  11/2017   partial hysterectomy; has ovaries.   Mar[facility] Kitchen BREAST CYST ASPIRATION Left   . BUNIONECTOMY    . DILATION AND CURETTAGE OF UTERUS    . RHINOPLASTY     . TONSILLECTOMY  1950  . VAGINAL DELIVERY     x2   Family History  Problem Relation Age of Onset  . Heart disease Mother   . Diabetes Father   . Heart failure Father   . Dementia Father   . Cancer Other        Breast, liver, lung  . Breast cancer Neg Hx       ALLERGIES: Other  Current Outpatient Medications on File Prior to Visit  Medication Sig Dispense Refill  . ALPRAZolam (XANAX) 0.25 MG tablet Take 1 tablet (0.25 mg total) by mouth daily as needed. for anxiety 30 tablet 1  . ASPIRIN 81 PO Take 1 tablet by mouth daily.    . metoprolol succinate (TOPROL-XL) 50 MG 24 hr tablet Take 25 mg by mouth daily. Take with or immediately following a meal.    . rosuvastatin (CRESTOR) 10 MG tablet Take 1 tablet (10 mg total) by mouth daily. 90 tablet 3   No current facility-administered medications on file prior to visit.     Social History   Tobacco Use  . Smoking status: Never Smoker  . Smokeless tobacco: Never Used  Substance Use Topics  . Alcohol use: Yes    Comment: Wine 2 to 3 times a week  . Drug use: No    Review of Systems  Constitutional:  Negative for chills, fever and unexpected weight change.  HENT: Negative for congestion.   Respiratory: Negative for cough.   Cardiovascular: Negative for chest pain, palpitations and leg swelling.  Gastrointestinal: Negative for nausea and vomiting.  Endocrine: Negative for cold intolerance and heat intolerance.  Genitourinary: Negative for difficulty urinating and vaginal bleeding.  Musculoskeletal: Negative for arthralgias and myalgias.  Skin: Negative for rash.  Neurological: Negative for headaches.  Hematological: Negative for adenopathy.  Psychiatric/Behavioral: Negative for confusion.      Objective:    BP (!) 150/90 (BP Location: Left Arm, Patient Position: Sitting, Cuff Size: Normal)   Pulse 80   Temp 98 F (36.7 C) (Oral)   Resp 15   Ht 4\' 11"  (1.499 m)   Wt 118 lb 8 oz (53.8 kg)   SpO2 97%   BMI 23.93  kg/m   BP Readings from Last 3 Encounters:  06/22/18 (!) 150/90  09/29/17 140/70  05/13/17 (!) 142/82   Wt Readings from Last 3 Encounters:  06/22/18 118 lb 8 oz (53.8 kg)  09/29/17 121 lb 8 oz (55.1 kg)  05/13/17 126 lb (57.2 kg)    Physical Exam  Constitutional: She appears well-developed and well-nourished.  HENT:  Diffusely thin hair. No patches or hair loss, bald spots.   Eyes: Conjunctivae are normal.  Neck: No thyroid mass and no thyromegaly present.  Cardiovascular: Normal rate, regular rhythm, normal heart sounds and normal pulses.  Pulmonary/Chest: Effort normal and breath sounds normal. She has no wheezes. She has no rhonchi. She has no rales. Right breast exhibits no inverted nipple, no mass, no nipple discharge, no skin change and no tenderness. Left breast exhibits no inverted nipple, no mass, no nipple discharge, no skin change and no tenderness. Breasts are symmetrical.  CBE performed.   Lymphadenopathy:       Head (right side): No submental, no submandibular, no tonsillar, no preauricular, no posterior auricular and no occipital adenopathy present.       Head (left side): No submental, no submandibular, no tonsillar, no preauricular, no posterior auricular and no occipital adenopathy present.    She has no cervical adenopathy.       Right cervical: No superficial cervical, no deep cervical and no posterior cervical adenopathy present.      Left cervical: No superficial cervical, no deep cervical and no posterior cervical adenopathy present.    She has no axillary adenopathy.  Neurological: She is alert.  Skin: Skin is warm and dry.  Psychiatric: She has a normal mood and affect. Her speech is normal and behavior is normal. Thought content normal.  Vitals reviewed.      Assessment & Plan:   Problem List Items Addressed This Visit      Cardiovascular and Mediastinum   Hypertension    Mildly elevated. Home values are better controlled.  Asymptomatic.  Patient  will send values from home and  We will titrate medications from there.        Relevant Medications   ASPIRIN 81 PO     Other   Hyperlipidemia    Pending lipid panel      Relevant Medications   ASPIRIN 81 PO   Routine general medical examination at a health care facility - Primary    Colonoscopy is due which is been ordered.  Mammogram is up-to-date.  Clinical breast exam performed today.  Deferred pelvic exam in the absence of symptoms patient is a history of partial hysterectomy.      Relevant  Orders   CBC with Differential/Platelet   Hemoglobin A1c   Lipid panel   TSH   VITAMIN D 25 Hydroxy (Vit-D Deficiency, Fractures)   Hair thinning    Diffusely thin. Pending tsh          I have discontinued Lorynn Moeser. Epting's aspirin. I am also having her maintain her metoprolol succinate, rosuvastatin, ALPRAZolam, and ASPIRIN 81 PO.   No orders of the defined types were placed in this encounter.   Return precautions given.   Risks, benefits, and alternatives of the medications and treatment plan prescribed today were discussed, and patient expressed understanding.   Education regarding symptom management and diagnosis given to patient on AVS.   Continue to follow with Burnard Hawthorne, FNP for routine health maintenance.   [place] and I agreed with plan.   Mable Paris, FNP

## 2018-06-22 NOTE — Assessment & Plan Note (Signed)
Colonoscopy is due which is been ordered.  Mammogram is up-to-date.  Clinical breast exam performed today.  Deferred pelvic exam in the absence of symptoms patient is a history of partial hysterectomy.

## 2018-06-22 NOTE — Assessment & Plan Note (Signed)
Diffusely thin. Pending tsh

## 2018-06-22 NOTE — Patient Instructions (Addendum)
Monitor blood pressure,  Goal is less than 120/80, based on newest guidelines; if persistently higher, please make sooner follow up appointment so we can recheck you blood pressure and manage medications   Let me about tetanus vaccine.    Health Maintenance for Postmenopausal Women Menopause is a normal process in which your reproductive ability comes to an end. This process happens gradually over a span of months to years, usually between the ages of 52 and 30. Menopause is complete when you have missed 12 consecutive menstrual periods. It is important to talk with your health care provider about some of the most common conditions that affect postmenopausal women, such as heart disease, cancer, and bone loss (osteoporosis). Adopting a healthy lifestyle and getting preventive care can help to promote your health and wellness. Those actions can also lower your chances of developing some of these common conditions. What should I know about menopause? During menopause, you may experience a number of symptoms, such as:  Moderate-to-severe hot flashes.  Night sweats.  Decrease in sex drive.  Mood swings.  Headaches.  Tiredness.  Irritability.  Memory problems.  Insomnia.  Choosing to treat or not to treat menopausal changes is an individual decision that you make with your health care provider. What should I know about hormone replacement therapy and supplements? Hormone therapy products are effective for treating symptoms that are associated with menopause, such as hot flashes and night sweats. Hormone replacement carries certain risks, especially as you become older. If you are thinking about using estrogen or estrogen with progestin treatments, discuss the benefits and risks with your health care provider. What should I know about heart disease and stroke? Heart disease, heart attack, and stroke become more likely as you age. This may be due, in part, to the hormonal changes that your  body experiences during menopause. These can affect how your body processes dietary fats, triglycerides, and cholesterol. Heart attack and stroke are both medical emergencies. There are many things that you can do to help prevent heart disease and stroke:  Have your blood pressure checked at least every 1-2 years. High blood pressure causes heart disease and increases the risk of stroke.  If you are 14-24 years old, ask your health care provider if you should take aspirin to prevent a heart attack or a stroke.  Do not use any tobacco products, including cigarettes, chewing tobacco, or electronic cigarettes. If you need help quitting, ask your health care provider.  It is important to eat a healthy diet and maintain a healthy weight. ? Be sure to include plenty of vegetables, fruits, low-fat dairy products, and lean protein. ? Avoid eating foods that are high in solid fats, added sugars, or salt (sodium).  Get regular exercise. This is one of the most important things that you can do for your health. ? Try to exercise for at least 150 minutes each week. The type of exercise that you do should increase your heart rate and make you sweat. This is known as moderate-intensity exercise. ? Try to do strengthening exercises at least twice each week. Do these in addition to the moderate-intensity exercise.  Know your numbers.Ask your health care provider to check your cholesterol and your blood glucose. Continue to have your blood tested as directed by your health care provider.  What should I know about cancer screening? There are several types of cancer. Take the following steps to reduce your risk and to catch any cancer development as early as possible. Breast  Cancer  Practice breast self-awareness. ? This means understanding how your breasts normally appear and feel. ? It also means doing regular breast self-exams. Let your health care provider know about any changes, no matter how small.  If  you are 30 or older, have a clinician do a breast exam (clinical breast exam or CBE) every year. Depending on your age, family history, and medical history, it may be recommended that you also have a yearly breast X-ray (mammogram).  If you have a family history of breast cancer, talk with your health care provider about genetic screening.  If you are at high risk for breast cancer, talk with your health care provider about having an MRI and a mammogram every year.  Breast cancer (BRCA) gene test is recommended for women who have family members with BRCA-related cancers. Results of the assessment will determine the need for genetic counseling and BRCA1 and for BRCA2 testing. BRCA-related cancers include these types: ? Breast. This occurs in males or females. ? Ovarian. ? Tubal. This may also be called fallopian tube cancer. ? Cancer of the abdominal or pelvic lining (peritoneal cancer). ? Prostate. ? Pancreatic.  Cervical, Uterine, and Ovarian Cancer Your health care provider may recommend that you be screened regularly for cancer of the pelvic organs. These include your ovaries, uterus, and vagina. This screening involves a pelvic exam, which includes checking for microscopic changes to the surface of your cervix (Pap test).  For women ages 21-65, health care providers may recommend a pelvic exam and a Pap test every three years. For women ages 57-65, they may recommend the Pap test and pelvic exam, combined with testing for human papilloma virus (HPV), every five years. Some types of HPV increase your risk of cervical cancer. Testing for HPV may also be done on women of any age who have unclear Pap test results.  Other health care providers may not recommend any screening for nonpregnant women who are considered low risk for pelvic cancer and have no symptoms. Ask your health care provider if a screening pelvic exam is right for you.  If you have had past treatment for cervical cancer or a  condition that could lead to cancer, you need Pap tests and screening for cancer for at least 20 years after your treatment. If Pap tests have been discontinued for you, your risk factors (such as having a new sexual partner) need to be reassessed to determine if you should start having screenings again. Some women have medical problems that increase the chance of getting cervical cancer. In these cases, your health care provider may recommend that you have screening and Pap tests more often.  If you have a family history of uterine cancer or ovarian cancer, talk with your health care provider about genetic screening.  If you have vaginal bleeding after reaching menopause, tell your health care provider.  There are currently no reliable tests available to screen for ovarian cancer.  Lung Cancer Lung cancer screening is recommended for adults 79-7 years old who are at high risk for lung cancer because of a history of smoking. A yearly low-dose CT scan of the lungs is recommended if you:  Currently smoke.  Have a history of at least 30 pack-years of smoking and you currently smoke or have quit within the past 15 years. A pack-year is smoking an average of one pack of cigarettes per day for one year.  Yearly screening should:  Continue until it has been 15 years since you  quit.  Stop if you develop a health problem that would prevent you from having lung cancer treatment.  Colorectal Cancer  This type of cancer can be detected and can often be prevented.  Routine colorectal cancer screening usually begins at age 67 and continues through age 73.  If you have risk factors for colon cancer, your health care provider may recommend that you be screened at an earlier age.  If you have a family history of colorectal cancer, talk with your health care provider about genetic screening.  Your health care provider may also recommend using home test kits to check for hidden blood in your stool.  A  small camera at the end of a tube can be used to examine your colon directly (sigmoidoscopy or colonoscopy). This is done to check for the earliest forms of colorectal cancer.  Direct examination of the colon should be repeated every 5-10 years until age 61. However, if early forms of precancerous polyps or small growths are found or if you have a family history or genetic risk for colorectal cancer, you may need to be screened more often.  Skin Cancer  Check your skin from head to toe regularly.  Monitor any moles. Be sure to tell your health care provider: ? About any new moles or changes in moles, especially if there is a change in a mole's shape or color. ? If you have a mole that is larger than the size of a pencil eraser.  If any of your family members has a history of skin cancer, especially at a young age, talk with your health care provider about genetic screening.  Always use sunscreen. Apply sunscreen liberally and repeatedly throughout the day.  Whenever you are outside, protect yourself by wearing long sleeves, pants, a wide-brimmed hat, and sunglasses.  What should I know about osteoporosis? Osteoporosis is a condition in which bone destruction happens more quickly than new bone creation. After menopause, you may be at an increased risk for osteoporosis. To help prevent osteoporosis or the bone fractures that can happen because of osteoporosis, the following is recommended:  If you are 77-96 years old, get at least 1,000 mg of calcium and at least 600 mg of vitamin D per day.  If you are older than age 84 but younger than age 24, get at least 1,200 mg of calcium and at least 600 mg of vitamin D per day.  If you are older than age 13, get at least 1,200 mg of calcium and at least 800 mg of vitamin D per day.  Smoking and excessive alcohol intake increase the risk of osteoporosis. Eat foods that are rich in calcium and vitamin D, and do weight-bearing exercises several times  each week as directed by your health care provider. What should I know about how menopause affects my mental health? Depression may occur at any age, but it is more common as you become older. Common symptoms of depression include:  Low or sad mood.  Changes in sleep patterns.  Changes in appetite or eating patterns.  Feeling an overall lack of motivation or enjoyment of activities that you previously enjoyed.  Frequent crying spells.  Talk with your health care provider if you think that you are experiencing depression. What should I know about immunizations? It is important that you get and maintain your immunizations. These include:  Tetanus, diphtheria, and pertussis (Tdap) booster vaccine.  Influenza every year before the flu season begins.  Pneumonia vaccine.  Shingles vaccine.  Your health care provider may also recommend other immunizations. This information is not intended to replace advice given to you by your health care provider. Make sure you discuss any questions you have with your health care provider. Document Released: 10/02/2005 Document Revised: 02/28/2016 Document Reviewed: 05/14/2015 Elsevier Interactive Patient Education  2018 [employer].

## 2018-06-22 NOTE — Assessment & Plan Note (Signed)
Pending lipid panel 

## 2018-06-23 NOTE — Telephone Encounter (Signed)
Left message to call [facility] may speak to patient

## 2018-06-24 NOTE — Telephone Encounter (Signed)
Spoke with patient she states  Dr Rockey Situ was going to take over cholesterol medication . Are you going to manage cholesterol medication . Thanks

## 2018-06-27 ENCOUNTER — Other Ambulatory Visit: Payer: Self-pay | Admitting: Cardiovascular Disease

## 2018-06-27 ENCOUNTER — Encounter: Payer: Self-pay | Admitting: Family

## 2018-06-27 ENCOUNTER — Telehealth: Payer: Self-pay

## 2018-06-27 MED ORDER — METOPROLOL SUCCINATE ER 50 MG PO TB24: 50.0000 mg | ORAL_TABLET | Freq: Every day | ORAL | 2 refills | Status: DC

## 2018-06-27 NOTE — Telephone Encounter (Signed)
Sent mychart message

## 2018-06-27 NOTE — Telephone Encounter (Signed)
°*  STAT* If patient is at the pharmacy, call can be transferred to refill team.   1. Which medications need to be refilled? (please list name of each medication and dose if known) Metoprolol  2. Which pharmacy/location (including street and city if local pharmacy) is medication to be sent to? [facility] in [place]  3. Do they need a 30 day or 90 day supply? 90 day   Patient has only one pill left   Please if we can send this in today

## 2018-06-27 NOTE — Telephone Encounter (Signed)
Refill sent to pharmacy,

## 2018-08-02 ENCOUNTER — Other Ambulatory Visit: Payer: Self-pay | Admitting: Cardiovascular Disease

## 2018-08-03 ENCOUNTER — Other Ambulatory Visit: Payer: Self-pay | Admitting: Family

## 2018-08-03 DIAGNOSIS — F419 Anxiety disorder, unspecified: Secondary | ICD-10-CM

## 2018-08-03 NOTE — Telephone Encounter (Signed)
I looked up patient on [place] Controlled Substances Reporting System and saw no activity that raised concern of inappropriate use.   

## 2018-08-19 ENCOUNTER — Encounter: Payer: Self-pay | Admitting: Family

## 2018-08-19 ENCOUNTER — Ambulatory Visit: Payer: PPO | Admitting: Podiatry

## 2018-08-19 ENCOUNTER — Other Ambulatory Visit: Payer: Self-pay | Admitting: Family

## 2018-08-19 DIAGNOSIS — I10 Essential (primary) hypertension: Secondary | ICD-10-CM

## 2018-08-19 MED ORDER — AMLODIPINE BESYLATE 5 MG PO TABS: 5.0000 mg | ORAL_TABLET | Freq: Every day | ORAL | 3 refills | Status: DC

## 2018-08-30 DIAGNOSIS — L570 Actinic keratosis: Secondary | ICD-10-CM | POA: Diagnosis not present

## 2018-08-30 DIAGNOSIS — L720 Epidermal cyst: Secondary | ICD-10-CM | POA: Diagnosis not present

## 2018-08-30 DIAGNOSIS — L578 Other skin changes due to chronic exposure to nonionizing radiation: Secondary | ICD-10-CM | POA: Diagnosis not present

## 2018-08-30 DIAGNOSIS — L820 Inflamed seborrheic keratosis: Secondary | ICD-10-CM | POA: Diagnosis not present

## 2018-10-01 DIAGNOSIS — R319 Hematuria, unspecified: Secondary | ICD-10-CM | POA: Diagnosis not present

## 2018-10-01 DIAGNOSIS — N3001 Acute cystitis with hematuria: Secondary | ICD-10-CM | POA: Diagnosis not present

## 2018-10-03 ENCOUNTER — Other Ambulatory Visit: Payer: Self-pay | Admitting: *Deleted

## 2018-10-03 NOTE — Patient Outreach (Signed)
[facility]) Care Management  10/03/2018  Melissa Turner Jul 30, 1941 [id number]   Telephone Screen  Referral Date: 10/03/2018  Referral Source:  Nurse call center Referral Reason: 2/8/2020blood in urine caller states she may have a bladder infection. She urinated blood, had slight burning a couple of days ago that went away Hx of HTN HDL- see pcp within 24 hours  Insurance: HTA  Outreach attempt # 1 No answer. [facility] RN CM left HIPAA compliant voicemail message along with CM's contact info.   Plan: [facility] RN CM sent an unsuccessful outreach letter and scheduled this patient for another call attempt within 4 business days   Jess Toney L. Lavina Hamman, RN, BSN, [place] Coordinator Office number [phone] Mobile number [phone]  Main [facility] number [phone] Fax number [phone]

## 2018-10-04 ENCOUNTER — Ambulatory Visit: Payer: Self-pay | Admitting: *Deleted

## 2018-10-05 ENCOUNTER — Other Ambulatory Visit: Payer: Self-pay | Admitting: *Deleted

## 2018-10-05 NOTE — Patient Outreach (Signed)
[facility]) Care Management  10/05/2018  Hailee Hollick [place] 20-Feb-1941 [id number]   Telephone Screen  Referral Date: 10/03/2018  Referral Source:  Nurse call center Referral Reason: 10/01/2018 blood in urine caller states she may have a bladder infection. She urinated blood, had slight burning a couple of days ago that went away Hx of HTN HDL- see pcp within 24 hours  Insurance: HTA  Outreach attempt # 2 successful at the home number  Patient is able to verify HIPAA Reviewed and addressed referral to [facility] with patient  Mrs Rehmann reports she is doing well  She did not see her primary MD but went to an urgent care to be seen a few days after the nurse call center call  She was placed on antibiotics and the s/s cleared within a days time per Mrs Jobe Gibbon She reports one other time in which she has experienced this She reports it also occurred on a Saturday and she had no obvious s/s but the blood in the urine  PMH of having a bladder procedure in April 2019  Denied pain, burning , odor CM discussed monitoring changes in elevation in wbcs, urine color She reports that she generally takes in 8 glasses of water a day and her urine is generally clear but had noted her urine was a pale yellow prior to this UTI dx  CM discussed use of Cranberry juice or tablets She reports she still has availability to see the uro-gyn Dr Harrold Donath  who did her April 2019 procedure and may make a follow up appointment to discuss s/s    Mrs Jobe Gibbon is appreciative of the follow call from [facility] RN CM   Social: Mrs Jobe Gibbon lives with her spouse she is independent with her care needs She reports being busy all the time. She denies concerns with transportation to medical appointments She continues to work   Conditions: HTN, HDL, anxiety, primary hyperparathyroidism, hair thinning  Medications: denies concerns with taking medications as prescribed, affording medications, side effects of medications  and questions about medications  Appointments: Advance Directives: Denies need for assist with advance directives   Consent: [facility] RN CM reviewed [facility] services with patient. Patient gave verbal consent for services.    Plan: [facility] RN CM will close case at this time as patient has been assessed and no needs identified/needs resolved.   Pt encouraged to return a call to [facility] RN CM prn    Trystin Terhune L. Lavina Hamman, RN, BSN, [place] Coordinator Office number [phone] Mobile number [phone]  Main [facility] number [phone] Fax number [phone]

## 2018-10-11 NOTE — Progress Notes (Signed)
Cardiology Office Note  Date:  10/12/2018   ID:  Melissa Turner, DOB 1940-09-27, MRN [id number]  PCP:  Melissa Hawthorne, FNP   Chief Complaint  Patient presents with  . other    12 mo f/u. Medications reviewed verbally with patient.     HPI:  [name] is a 78 year old woman with history of nonsmoker HTN Hyperlipidemia Elevated CT coronary calcium score 104 on scan October 2018 Who presents for follow-up of her coronary artery disease   Her last clinic visit we discussed CT coronary calcium score results Her weight at that time down 5 pounds Blood pressure was stable Change from Lipitor to Crestor as she was having myalgias  In follow-up today reports it is been a stressful 6 months Had upper respiratory infection, feels like she is losing her hair Has not been exercising as she used to, weight up slightly Has not been measuring blood pressure at home, was started on amlodipine 5 but has not started this yet  EKG reviewed and discussed with her in detail, showing normal sinus rhythm rate 83 bpm APCs and PVCs noted otherwise no significant ST or T wave changes  Reports she is occasionally symptomatic from ectopy  Lab work discussed with her in detail Hemoglobin A1c 5.6, stable Total cholesterol 139, LDL 77 Prior LDL 125  Other past medical history reviewed Family history reviewed on today's visit Mom with CAD age 41, PCI, smoker Father possible CHF, diabetes GM with MI, 54, died of CVA, smoker  PMH:   has a past medical history of Deviated septum, GERD (gastroesophageal reflux disease), History of blood transfusion, History of chicken pox, HTN (hypertension), Hypercholesteremia, and Migraines.  PSH:    Past Surgical History:  Procedure Laterality Date  . ABDOMINAL HYSTERECTOMY  11/2017   partial hysterectomy; has ovaries.   Marland Kitchen BREAST CYST ASPIRATION Left   . BUNIONECTOMY    . DILATION AND CURETTAGE OF UTERUS    . RHINOPLASTY    . TONSILLECTOMY  1950  .  VAGINAL DELIVERY     x2    Current Outpatient Medications  Medication Sig Dispense Refill  . ALPRAZolam (XANAX) 0.25 MG tablet TAKE 1 TABLET(0.25 MG) BY MOUTH DAILY AS NEEDED FOR ANXIETY 30 tablet 1  . amLODipine (NORVASC) 5 MG tablet Take 1 tablet (5 mg total) by mouth daily. 90 tablet 3  . ASPIRIN 81 PO Take 1 tablet by mouth daily.    . metoprolol succinate (TOPROL-XL) 50 MG 24 hr tablet Take 1 tablet (50 mg total) by mouth daily. Take with or immediately following a meal. 30 tablet 2  . rosuvastatin (CRESTOR) 10 MG tablet TAKE 1 TABLET(10 MG) BY MOUTH DAILY 90 tablet 3   No current facility-administered medications for this visit.     Allergies:   Other   Social History:  The patient  reports that she has never smoked. She has never used smokeless tobacco. She reports current alcohol use. She reports that she does not use drugs.   Family History:   family history includes Cancer in an other family member; Dementia in her father; Diabetes in her father; Heart disease in her mother; Heart failure in her father.    Review of Systems: Review of Systems  Constitutional: Negative.   Respiratory: Negative.   Cardiovascular: Negative.   Gastrointestinal: Negative.   Musculoskeletal: Negative.   Neurological: Negative.   Psychiatric/Behavioral: Negative.   All other systems reviewed and are negative.   PHYSICAL EXAM: VS:  BP Marland Kitchen)  144/82 (BP Location: Left Arm, Patient Position: Sitting, Cuff Size: Normal)   Pulse 83   Ht 5\' 3"  (1.6 m)   Wt 119 lb (54 kg)   BMI 21.08 kg/m  , BMI Body mass index is 21.08 kg/m. Constitutional:  oriented to person, place, and time. No distress.  HENT:  Head: Grossly normal Eyes:  no discharge. No scleral icterus.  Neck: No JVD, no carotid bruits  Cardiovascular: Regular rate and rhythm, no murmurs appreciated Pulmonary/Chest: Clear to auscultation bilaterally, no wheezes or rails Abdominal: Soft.  no distension.  no tenderness.   Musculoskeletal: Normal range of motion Neurological:  normal muscle tone. Coordination normal. No atrophy Skin: Skin warm and dry Psychiatric: normal affect, pleasant   Recent Labs: 06/22/2018: Hemoglobin 15.1; Platelets 252.0; TSH 2.12    Lipid Panel Lab Results  Component Value Date   CHOL 139 06/22/2018   HDL 39.20 06/22/2018   LDLCALC 77 06/22/2018   TRIG 112.0 06/22/2018      Wt Readings from Last 3 Encounters:  10/12/18 119 lb (54 kg)  06/22/18 118 lb 8 oz (53.8 kg)  09/29/17 121 lb 8 oz (55.1 kg)       ASSESSMENT AND PLAN:  Mixed hyperlipidemia -  Numbers at goal on Crestor 10 mg daily No medication changes made Discussed with her in detail, LDL 77  Essential hypertension - Recommend she monitor blood pressure at home If it is elevated she has options including taking metoprolol 50 daily or 25 twice daily Alternatively she could take metoprolol 25 with amlodipine 2.5 up to 5 She will try the medications We did discuss that she should watch for leg swelling on amlodipine She has trace nonpitting edema on the left  APCs, PVCs Having occasional palpitations Discussed types of ectopy both seen on her EKG today Currently takes metoprolol 25 at nighttime before bed Suggested she could try 25 twice daily if she would like, if symptomatic  CAD home Mild coronary calcification on CT scan Cholesterol at goal   Disposition:   F/U as needed   Total encounter time more than 25 minutes  Greater than 50% was spent in counseling and coordination of care with the patient    Orders Placed This Encounter  Procedures  . EKG 12-Lead     Signed, Melissa Turner, M.D., Ph.D. 10/12/2018  [name], Melissa Turner

## 2018-10-12 ENCOUNTER — Encounter: Payer: Self-pay | Admitting: Cardiovascular Disease

## 2018-10-12 ENCOUNTER — Ambulatory Visit: Payer: PPO | Admitting: Cardiovascular Disease

## 2018-10-12 VITALS — BP 144/82 | HR 83 | Ht 63.0 in | Wt 119.0 lb

## 2018-10-12 DIAGNOSIS — I10 Essential (primary) hypertension: Secondary | ICD-10-CM | POA: Diagnosis not present

## 2018-10-12 DIAGNOSIS — I2510 Atherosclerotic heart disease of native coronary artery without angina pectoris: Secondary | ICD-10-CM

## 2018-10-12 DIAGNOSIS — I2584 Coronary atherosclerosis due to calcified coronary lesion: Secondary | ICD-10-CM | POA: Diagnosis not present

## 2018-10-12 DIAGNOSIS — E785 Hyperlipidemia, unspecified: Secondary | ICD-10-CM

## 2018-10-12 NOTE — Patient Instructions (Addendum)
Medication Instructions:  No changes  If you need a refill on your cardiac medications before your next appointment, please call your pharmacy.    Lab work: No new labs needed   If you have labs (blood work) drawn today and your tests are completely normal, you will receive your results only by: Marland Kitchen MyChart Message (if you have MyChart) OR . A paper copy in the mail If you have any lab test that is abnormal or we need to change your treatment, we will call you to review the results.   Testing/Procedures: No new testing needed   Follow-Up: At [facility], you and your health needs are our priority.  As part of our continuing mission to provide you with exceptional heart care, we have created designated Provider Care Teams.  These Care Teams include your primary Cardiologist (physician) and Advanced Practice Providers (APPs -  Physician Assistants and Nurse Practitioners) who all work together to provide you with the care you need, when you need it.  . You will need a follow up appointment as needed home .   Please call our office 2 months in advance to schedule this appointment.    . Providers on your designated Care Team:   . Murray Hodgkins, NP . Christell Faith, PA-C . Marrianne Mood, PA-C  Any Other Special Instructions Will Be Listed Below (If Applicable).  For educational health videos Log in to : [email or web address] Or : [email or web address], password : [email or web address]

## 2018-10-19 DIAGNOSIS — M7542 Impingement syndrome of left shoulder: Secondary | ICD-10-CM | POA: Diagnosis not present

## 2018-11-09 ENCOUNTER — Encounter: Payer: Self-pay | Admitting: Family

## 2019-02-26 ENCOUNTER — Other Ambulatory Visit: Payer: Self-pay | Admitting: Family

## 2019-02-26 DIAGNOSIS — F419 Anxiety disorder, unspecified: Secondary | ICD-10-CM

## 2019-02-27 NOTE — Telephone Encounter (Signed)
Please education patient. This is controlled substance, I refilled xanax;    In order for me to prescribe medication,  Patient must be seen every 3-6 months. please make follow-up appointment.   I looked up patient on [place] Controlled Substances Reporting System and saw no activity that raised concern of inappropriate use.

## 2019-02-28 NOTE — Telephone Encounter (Signed)
Patient is scheduled for Doxy 03/06/19.

## 2019-03-06 ENCOUNTER — Encounter: Payer: Self-pay | Admitting: Family

## 2019-03-06 ENCOUNTER — Other Ambulatory Visit: Payer: Self-pay

## 2019-03-06 ENCOUNTER — Ambulatory Visit (INDEPENDENT_AMBULATORY_CARE_PROVIDER_SITE_OTHER): Payer: PPO | Admitting: Family

## 2019-03-06 DIAGNOSIS — Z1239 Encounter for other screening for malignant neoplasm of breast: Secondary | ICD-10-CM

## 2019-03-06 DIAGNOSIS — I10 Essential (primary) hypertension: Secondary | ICD-10-CM

## 2019-03-06 NOTE — Progress Notes (Signed)
This visit type was conducted due to national recommendations for restrictions regarding the COVID-19 pandemic (e.g. social distancing).  This format is felt to be most appropriate for this patient at this time.  All issues noted in this document were discussed and addressed.  No physical exam was performed (except for noted visual exam findings with Video Visits). Virtual Visit via Video Note  I connected with@  on 03/06/19 at  9:00 AM EDT by a video enabled telemedicine application and verified that I am speaking with the correct person using two identifiers.  Location patient: home Location provider:work  Persons participating in the virtual visit: patient, provider  I discussed the limitations of evaluation and management by telemedicine and the availability of in person appointments. The patient expressed understanding and agreed to proceed.  Interactive audio and video telecommunications were attempted between this provider and patient, however failed, due to patient having technical difficulties or patient did not have access to video capability.  We continued and completed visit with audio only.     HPI:  Retired now. Feels well no concerns.   HTN- compliant with metoprolol. Not on norvasc as didn't want another medication. 140/80, HR 68-80.Denies exertional chest pain or pressure, numbness or tingling radiating to left arm or jaw, palpitations, dizziness, frequent headaches, changes in vision, or shortness of breath.    GAD- feels well. Using xanax prn.   Hyperparathyroidism- following with Dr Gabriel Carina  Tetanus due-thinks had at [facility].  ROS: See pertinent positives and negatives per HPI.  Past Medical History:  Diagnosis Date  . Deviated septum    s/p rhinoplasty  . GERD (gastroesophageal reflux disease)   . History of blood transfusion    during first child's birth  . History of chicken pox   . HTN (hypertension)   . Hypercholesteremia   . Migraines     Past Surgical  History:  Procedure Laterality Date  . ABDOMINAL HYSTERECTOMY  11/2017   partial hysterectomy; has ovaries.   Marland Kitchen BREAST CYST ASPIRATION Left   . BUNIONECTOMY    . DILATION AND CURETTAGE OF UTERUS    . RHINOPLASTY    . TONSILLECTOMY  1950  . VAGINAL DELIVERY     x2    Family History  Problem Relation Age of Onset  . Heart disease Mother   . Diabetes Father   . Heart failure Father   . Dementia Father   . Cancer Other        Breast, liver, lung  . Breast cancer Neg Hx     SOCIAL HX: never smoker   Current Outpatient Medications:  .  ALPRAZolam (XANAX) 0.25 MG tablet, TAKE 1 TABLET(0.25 MG) BY MOUTH DAILY AS NEEDED FOR ANXIETY, Disp: 30 tablet, Rfl: 1 .  amLODipine (NORVASC) 5 MG tablet, Take 1 tablet (5 mg total) by mouth daily., Disp: 90 tablet, Rfl: 3 .  ASPIRIN 81 PO, Take 1 tablet by mouth daily., Disp: , Rfl:  .  Cholecalciferol (VITAMIN D3) 1.25 MG (50000 UT) CAPS, Vitamin D3, Disp: , Rfl:  .  metoprolol succinate (TOPROL-XL) 50 MG 24 hr tablet, Take 1 tablet (50 mg total) by mouth daily. Take with or immediately following a meal., Disp: 30 tablet, Rfl: 2 .  rosuvastatin (CRESTOR) 10 MG tablet, TAKE 1 TABLET(10 MG) BY MOUTH DAILY, Disp: 90 tablet, Rfl: 3  EXAM:  VITALS per patient if applicable:  GENERAL: alert, oriented, appears well and in no acute distress  HEENT: atraumatic, conjunttiva clear, no obvious abnormalities on  inspection of external nose and ears  NECK: normal movements of the head and neck  LUNGS: on inspection no signs of respiratory distress, breathing rate appears normal, no obvious gross SOB, gasping or wheezing  CV: no obvious cyanosis  MS: moves all visible extremities without noticeable abnormality  PSYCH/NEURO: pleasant and cooperative, no obvious depression or anxiety, speech and thought processing grossly intact  ASSESSMENT AND PLAN:  Discussed the following assessment and plan: Problem List Items Addressed This Visit       Cardiovascular and Mediastinum   Hypertension - Primary    agreeable to trial 2.5mg  amlodipine. F/u 2 weeks.         Other   Screening for breast cancer    Ordered mammogram, bone density. Patient will schedule.       Relevant Orders   MM 3D SCREEN BREAST BILATERAL   DG Bone Density        I discussed the assessment and treatment plan with the patient. The patient was provided an opportunity to ask questions and all were answered. The patient agreed with the plan and demonstrated an understanding of the instructions.   The patient was advised to call back or seek an in-person evaluation if the symptoms worsen or if the condition fails to improve as anticipated.   Mable Paris, FNP   I spent 16 min non face to face w/ pt.

## 2019-03-06 NOTE — Assessment & Plan Note (Signed)
agreeable to trial 2.5mg  amlodipine. F/u 2 weeks.

## 2019-03-06 NOTE — Assessment & Plan Note (Signed)
Ordered mammogram, bone density. Patient will schedule.

## 2019-03-06 NOTE — Patient Instructions (Addendum)
Trial of 2.5mg  amlodipine.   Monitor blood pressure,  Goal is less than 130/80,  if persistently higher, please make sooner follow up appointment so we can recheck you blood pressure and manage medications  Please call call and schedule your 3D mammogram, bone density scan ( last 2018) as discussed.   [place]  [place], [place]    Due tetanus vaccine ( this Tdap) - or let me know when you had done.   Let me know if need anything else

## 2019-03-07 DIAGNOSIS — L570 Actinic keratosis: Secondary | ICD-10-CM | POA: Diagnosis not present

## 2019-03-07 DIAGNOSIS — L578 Other skin changes due to chronic exposure to nonionizing radiation: Secondary | ICD-10-CM | POA: Diagnosis not present

## 2019-03-07 DIAGNOSIS — L821 Other seborrheic keratosis: Secondary | ICD-10-CM | POA: Diagnosis not present

## 2019-03-07 DIAGNOSIS — L820 Inflamed seborrheic keratosis: Secondary | ICD-10-CM | POA: Diagnosis not present

## 2019-03-07 DIAGNOSIS — H2513 Age-related nuclear cataract, bilateral: Secondary | ICD-10-CM | POA: Diagnosis not present

## 2019-03-15 DIAGNOSIS — E210 Primary hyperparathyroidism: Secondary | ICD-10-CM | POA: Diagnosis not present

## 2019-03-16 ENCOUNTER — Telehealth: Payer: Self-pay

## 2019-03-16 NOTE — Telephone Encounter (Signed)
Copied from [place] [id number]. Topic: General - Other >> Mar 16, 2019 10:20 AM Erick Blinks wrote: Reason for CRM: Pt called to report last TDAP was March 11, 2017

## 2019-03-16 NOTE — Telephone Encounter (Signed)
Pt called back to report that her last TDAP was March 11, 2017

## 2019-03-16 NOTE — Telephone Encounter (Signed)
Chart has been updated.

## 2019-03-20 ENCOUNTER — Ambulatory Visit: Payer: PPO | Admitting: Family

## 2019-03-21 DIAGNOSIS — M8589 Other specified disorders of bone density and structure, multiple sites: Secondary | ICD-10-CM | POA: Diagnosis not present

## 2019-03-21 DIAGNOSIS — E210 Primary hyperparathyroidism: Secondary | ICD-10-CM | POA: Diagnosis not present

## 2019-03-22 ENCOUNTER — Encounter: Payer: Self-pay | Admitting: Family

## 2019-03-31 ENCOUNTER — Other Ambulatory Visit: Payer: Self-pay

## 2019-03-31 ENCOUNTER — Encounter: Payer: Self-pay | Admitting: Family

## 2019-03-31 ENCOUNTER — Ambulatory Visit (INDEPENDENT_AMBULATORY_CARE_PROVIDER_SITE_OTHER): Payer: PPO | Admitting: Family

## 2019-03-31 ENCOUNTER — Other Ambulatory Visit (INDEPENDENT_AMBULATORY_CARE_PROVIDER_SITE_OTHER): Payer: PPO

## 2019-03-31 DIAGNOSIS — R300 Dysuria: Secondary | ICD-10-CM | POA: Diagnosis not present

## 2019-03-31 MED ORDER — NITROFURANTOIN MONOHYD MACRO 100 MG PO CAPS: 100.0000 mg | ORAL_CAPSULE | Freq: Two times a day (BID) | ORAL | 0 refills | Status: DC

## 2019-03-31 NOTE — Addendum Note (Signed)
Addended by: Leeanne Rio on: 03/31/2019 02:47 PM   Modules accepted: Orders

## 2019-03-31 NOTE — Assessment & Plan Note (Addendum)
Afebrile. Pending urine studies. Will start macrobid ahead of culture.

## 2019-03-31 NOTE — Progress Notes (Signed)
Verbal consent for services obtained from patient prior to services given to TELEPHONE visit:   Location of call:  provider at work patient at home  Names of all persons present for services: Mable Paris, NP Chief complaint:   CC: dysuria x one day Endorses pink tinge to urine.   No changes to vaginal discharge, flank pain, N, v, fever.     History, background, results pertinent:  NO recurrent UTI.    A/P/next steps: Problem List Items Addressed This Visit      Other   Dysuria - Primary    Afebrile. Pending urine studies. Will start macrobid ahead of culture.       Relevant Medications   nitrofurantoin, macrocrystal-monohydrate, (MACROBID) 100 MG capsule   Other Relevant Orders   Urine Microscopic Only   Urine Culture   POCT Urinalysis Dipstick       I spent 15 min  discussing plan of care over the phone.

## 2019-03-31 NOTE — Patient Instructions (Signed)
Start macrobid.  Ensure to take probiotics while on antibiotics and also for 2 weeks after completion. It is important to re-colonize the gut with good bacteria and also to prevent any diarrheal infections associated with antibiotic use.   Will call ahead of urine culture.   Stay safe!

## 2019-04-01 LAB — URINALYSIS, MICROSCOPIC ONLY
Casts: NONE SEEN /lpf
Epithelial Cells (non renal): NONE SEEN /hpf (ref 0–10)

## 2019-04-02 LAB — URINE CULTURE

## 2019-05-12 ENCOUNTER — Other Ambulatory Visit: Payer: Self-pay

## 2019-05-12 ENCOUNTER — Ambulatory Visit (INDEPENDENT_AMBULATORY_CARE_PROVIDER_SITE_OTHER): Payer: PPO

## 2019-05-12 ENCOUNTER — Encounter: Payer: Self-pay | Admitting: Podiatry

## 2019-05-12 ENCOUNTER — Other Ambulatory Visit: Payer: Self-pay | Admitting: Podiatry

## 2019-05-12 ENCOUNTER — Ambulatory Visit: Payer: PPO | Admitting: Podiatry

## 2019-05-12 DIAGNOSIS — S93601A Unspecified sprain of right foot, initial encounter: Secondary | ICD-10-CM

## 2019-05-12 DIAGNOSIS — S99921A Unspecified injury of right foot, initial encounter: Secondary | ICD-10-CM

## 2019-05-14 NOTE — Progress Notes (Signed)
   HPI: 78 y.o. female presenting today with a chief complaint of right foot pain that began 1.5 months ago secondary to an injury that occurred on 04/05/2019. She states she slipped off a step and twisted the foot. She felt a pop and immediately felt pain. She states the pain has improved since the injury but has not resolved completely. She has been taking OTC Advil for treatment. Patient is here for further evaluation and treatment.   Past Medical History:  Diagnosis Date  . Deviated septum    s/p rhinoplasty  . GERD (gastroesophageal reflux disease)   . History of blood transfusion    during first child's birth  . History of chicken pox   . HTN (hypertension)   . Hypercholesteremia   . Migraines      Physical Exam: General: The patient is alert and oriented x3 in no acute distress.  Dermatology: Skin is warm, dry and supple bilateral lower extremities. Negative for open lesions or macerations.  Vascular: Moderate edema noted diffusely throughout the foot. Palpable pedal pulses bilaterally. No erythema noted. Capillary refill within normal limits.  Neurological: Epicritic and protective threshold grossly intact bilaterally.   Musculoskeletal Exam: Moderate pain on palpation noted throughout the right foot. Range of motion within normal limits to all pedal and ankle joints bilateral. Muscle strength 5/5 in all groups bilateral.   Radiographic Exam:  Normal osseous mineralization. Joint spaces preserved. No fracture/dislocation/boney destruction.    Assessment: 1. Foot sprain right DOI: 04/05/2019 - improving   Plan of Care:  1. Patient evaluated. X-Rays reviewed.  2. Recommended CAM boot for three weeks. Patient has one at home.  3. Compression anklet dispensed.  4. Continue taking OTC Motrin as needed.  5. Return to clinic as needed.       Edrick Kins, DPM [facility]  Dr. Edrick Kins, DPM    [address], [place] [address]                Office [phone]  Fax [phone]

## 2019-05-16 ENCOUNTER — Other Ambulatory Visit: Payer: PPO

## 2019-05-16 ENCOUNTER — Ambulatory Visit
Admission: RE | Admit: 2019-05-16 | Discharge: 2019-05-16 | Disposition: A | Discharge status: Home / Self Care | Payer: PPO | Source: Ambulatory Visit | Attending: Family | Admitting: Family

## 2019-05-16 DIAGNOSIS — Z1231 Encounter for screening mammogram for malignant neoplasm of breast: Secondary | ICD-10-CM | POA: Insufficient documentation

## 2019-05-16 DIAGNOSIS — Z1239 Encounter for other screening for malignant neoplasm of breast: Secondary | ICD-10-CM

## 2019-06-01 ENCOUNTER — Other Ambulatory Visit: Payer: Self-pay | Admitting: Cardiovascular Disease

## 2019-06-05 DIAGNOSIS — M533 Sacrococcygeal disorders, not elsewhere classified: Secondary | ICD-10-CM | POA: Diagnosis not present

## 2019-06-28 ENCOUNTER — Encounter: Payer: PPO | Admitting: Family

## 2019-08-07 ENCOUNTER — Encounter: Payer: PPO | Admitting: Family

## 2019-08-11 ENCOUNTER — Encounter: Payer: PPO | Admitting: Family

## 2019-08-30 ENCOUNTER — Other Ambulatory Visit: Payer: Self-pay | Admitting: Cardiovascular Disease

## 2019-08-30 MED ORDER — METOPROLOL SUCCINATE ER 50 MG PO TB24: ORAL_TABLET | ORAL | 0 refills | Status: DC

## 2019-08-30 NOTE — Telephone Encounter (Signed)
Requested Prescriptions   Signed Prescriptions Disp Refills   metoprolol succinate (TOPROL-XL) 50 MG 24 hr tablet 90 tablet 0    Sig: TAKE 1 TABLET BY MOUTH DAILY. TAKE WITH OR IMMEDIATELY FOLLOWING A MEAL    Authorizing Provider: Minna Merritts    Ordering User: NEWCOMER MCCLAIN, BRANDY L

## 2019-08-30 NOTE — Telephone Encounter (Signed)
*  STAT* If patient is at the pharmacy, call can be transferred to refill team.   1. Which medications need to be refilled? (please list name of each medication and dose if known) metoprolol 50 mg po q d   2. Which pharmacy/location (including street and city if local pharmacy) is medication to be sent to? [facility] [address]   3. Do they need a 30 day or 90 day supply? [place]

## 2019-08-31 ENCOUNTER — Telehealth: Payer: Self-pay | Admitting: Family

## 2019-08-31 DIAGNOSIS — Z0000 Encounter for general adult medical examination without abnormal findings: Secondary | ICD-10-CM

## 2019-08-31 NOTE — Telephone Encounter (Signed)
Pt has a cpe on 10/23/2019, she would like to have labs done before appt. No orders are in, if needed let me know so I can schedule for patient.

## 2019-09-01 NOTE — Telephone Encounter (Signed)
Call pt and sch fasting labs

## 2019-09-01 NOTE — Telephone Encounter (Signed)
Pt scheduled for fasting labs.  ?

## 2019-09-06 ENCOUNTER — Other Ambulatory Visit: Payer: Self-pay | Admitting: Family

## 2019-09-06 DIAGNOSIS — I10 Essential (primary) hypertension: Secondary | ICD-10-CM

## 2019-09-08 ENCOUNTER — Encounter: Payer: PPO | Admitting: Family

## 2019-10-14 NOTE — Progress Notes (Signed)
Cardiology Office Note  Date:  10/16/2019   ID:  Melissa Turner, DOB 10-09-1940, MRN [id number]  PCP:  Burnard Hawthorne, FNP   Chief Complaint  Patient presents with  . other    12 month follow up. Meds reviewed by the pt. verbally. "doing well."     HPI:  [name] is a 79 year old woman with history of nonsmoker HTN Hyperlipidemia Elevated CT coronary calcium score 104 on scan October 2018 Who presents for follow-up of her coronary artery disease, hypertension  In follow-up today, she has been changing some blood pressure medications She was on amlodipine for one year, stopped 2 weeks ago Increased the metoprolol succinate 25 BID Initially blood pressure was well controlled but has seemed to trend upward since then BOP at home: 138-152/80s, 140s often at home  Lipitor myalgias On crestor, no significant side effects  Still with hair loss, etiology unclear Depressed mood through the year, secondary to Covid restrictions Left shoulder pain, possibly from lifting heavy items, hurts to raise it up  No shortness of breath, chest pain  EKG reviewed and discussed with her in detail, showing normal sinus rhythm rate 75 bpm  Lab work in 2019 Hemoglobin A1c 5.6 Total cholesterol 139, LDL 77 Prior LDL 125 New lab work pending  Other past medical history reviewed Family history reviewed on today's visit Mom with CAD age 54, PCI, smoker Father possible CHF, diabetes GM with MI, 12, died of CVA, smoker  PMH:   has a past medical history of Deviated septum, GERD (gastroesophageal reflux disease), History of blood transfusion, History of chicken pox, HTN (hypertension), Hypercholesteremia, and Migraines.  PSH:    Past Surgical History:  Procedure Laterality Date  . ABDOMINAL HYSTERECTOMY  11/2017   partial hysterectomy; has ovaries.   Marland Kitchen BREAST CYST ASPIRATION Left   . BUNIONECTOMY    . DILATION AND CURETTAGE OF UTERUS    . RHINOPLASTY    . TONSILLECTOMY  1950  .  VAGINAL DELIVERY     x2    Current Outpatient Medications  Medication Sig Dispense Refill  . ALPRAZolam (XANAX) 0.25 MG tablet TAKE 1 TABLET(0.25 MG) BY MOUTH DAILY AS NEEDED FOR ANXIETY 30 tablet 1  . ASPIRIN 81 PO Take 1 tablet by mouth daily.    . Cholecalciferol (VITAMIN D3) 1.25 MG (50000 UT) CAPS Vitamin D3    . metoprolol succinate (TOPROL-XL) 50 MG 24 hr tablet TAKE 1 TABLET BY MOUTH DAILY. TAKE WITH OR IMMEDIATELY FOLLOWING A MEAL 90 tablet 0  . rosuvastatin (CRESTOR) 10 MG tablet TAKE 1 TABLET(10 MG) BY MOUTH DAILY 90 tablet 3   No current facility-administered medications for this visit.    Allergies:   Other   Social History:  The patient  reports that she has never smoked. She has never used smokeless tobacco. She reports current alcohol use. She reports that she does not use drugs.   Family History:   family history includes Cancer in an other family member; Dementia in her father; Diabetes in her father; Heart disease in her mother; Heart failure in her father.    Review of Systems: Review of Systems  Constitutional: Negative.   HENT: Negative.   Respiratory: Negative.   Cardiovascular: Negative.   Gastrointestinal: Negative.   Musculoskeletal: Positive for joint pain.  Neurological: Negative.   Psychiatric/Behavioral: Negative.   All other systems reviewed and are negative.   PHYSICAL EXAM: VS:  BP (!) 160/70 (BP Location: Left Arm, Patient Position: Sitting, Cuff Size:  Normal)   Pulse 75   Ht 4\' 11"  (1.499 m)   Wt 121 lb (54.9 kg)   SpO2 98%   BMI 24.44 kg/m  , BMI Body mass index is 24.44 kg/m. Constitutional:  oriented to person, place, and time. No distress.  HENT:  Head: Grossly normal Eyes:  no discharge. No scleral icterus.  Neck: No JVD, no carotid bruits  Cardiovascular: Regular rate and rhythm, no murmurs appreciated Pulmonary/Chest: Clear to aucultation bilaterally, no wheezes or rails Abdominal: Soft.  no distension.  no tenderness.   Musculoskeletal: Normal range of motion Neurological:  normal muscle tone. Coordination normal. No atrophy Skin: Skin warm and dry Psychiatric: normal affect, pleasant   Recent Labs: No results found for requested labs within last 8760 hours.    Lipid Panel Lab Results  Component Value Date   CHOL 139 06/22/2018   HDL 39.20 06/22/2018   LDLCALC 77 06/22/2018   TRIG 112.0 06/22/2018      Wt Readings from Last 3 Encounters:  10/16/19 121 lb (54.9 kg)  10/12/18 119 lb (54 kg)  06/22/18 118 lb 8 oz (53.8 kg)       ASSESSMENT AND PLAN:  Mixed hyperlipidemia -  Tolerating Crestor 10 mg daily No medication changes made New lab work pending Goal LDL less than 70  Essential hypertension - Recommend she monitor blood pressure at home Suggested she restart amlodipine 2.5 up to 5 mg daily Metoprolol succinate 25 up to 50 as needed for blood pressure Goal systolic pressure in the [id number]  APCs, PVCs Previously with occasional palpitations We will continue metoprolol as above  CAD home Mild coronary calcification on CT scan, discussed Calcium score 100 On Crestor  Disposition:   F/U 1 year   Total encounter time more than 25 minutes  Greater than 50% was spent in counseling and coordination of care with the patient    Orders Placed This Encounter  Procedures  . EKG 12-Lead     Signed, Esmond Plants, M.D., Ph.D. 10/16/2019  [employer], [place]

## 2019-10-16 ENCOUNTER — Other Ambulatory Visit: Payer: Self-pay

## 2019-10-16 ENCOUNTER — Encounter: Payer: Self-pay | Admitting: Cardiovascular Disease

## 2019-10-16 ENCOUNTER — Ambulatory Visit: Payer: PPO | Admitting: Cardiovascular Disease

## 2019-10-16 VITALS — BP 160/70 | HR 75 | Ht 59.0 in | Wt 121.0 lb

## 2019-10-16 DIAGNOSIS — I2510 Atherosclerotic heart disease of native coronary artery without angina pectoris: Secondary | ICD-10-CM

## 2019-10-16 DIAGNOSIS — I2584 Coronary atherosclerosis due to calcified coronary lesion: Secondary | ICD-10-CM | POA: Diagnosis not present

## 2019-10-16 DIAGNOSIS — E785 Hyperlipidemia, unspecified: Secondary | ICD-10-CM

## 2019-10-16 DIAGNOSIS — I10 Essential (primary) hypertension: Secondary | ICD-10-CM | POA: Diagnosis not present

## 2019-10-16 MED ORDER — AMLODIPINE BESYLATE 5 MG PO TABS: 5.0000 mg | ORAL_TABLET | Freq: Every day | ORAL | 3 refills | Status: DC

## 2019-10-16 NOTE — Patient Instructions (Addendum)
Medication Instructions:  Please start amlodipine 2.5 to 5 mg daily  Adjust depending blood pressure Ok to decrease metoprolol down to 25 mg daily if blood pressure tolerates  If you need a refill on your cardiac medications before your next appointment, please call your pharmacy.    Lab work: No new labs needed   If you have labs (blood work) drawn today and your tests are completely normal, you will receive your results only by: Marland Kitchen MyChart Message (if you have MyChart) OR . A paper copy in the mail If you have any lab test that is abnormal or we need to change your treatment, we will call you to review the results.   Testing/Procedures: No new testing needed   Follow-Up: At [facility], you and your health needs are our priority.  As part of our continuing mission to provide you with exceptional heart care, we have created designated Provider Care Teams.  These Care Teams include your primary Cardiologist (physician) and Advanced Practice Providers (APPs -  Physician Assistants and Nurse Practitioners) who all work together to provide you with the care you need, when you need it.  . You will need a follow up appointment in 12 months   . Providers on your designated Care Team:   . Murray Hodgkins, NP . Christell Faith, PA-C . Marrianne Mood, PA-C  Any Other Special Instructions Will Be Listed Below (If Applicable).    COVID-19 Vaccine Information can be found at: [email or web address].uk For questions related to vaccine distribution or appointments, please email [email or web address] or call [phone].

## 2019-10-18 ENCOUNTER — Other Ambulatory Visit (INDEPENDENT_AMBULATORY_CARE_PROVIDER_SITE_OTHER): Payer: PPO

## 2019-10-18 ENCOUNTER — Other Ambulatory Visit: Payer: Self-pay

## 2019-10-18 DIAGNOSIS — Z0000 Encounter for general adult medical examination without abnormal findings: Secondary | ICD-10-CM | POA: Diagnosis not present

## 2019-10-18 LAB — COMPREHENSIVE METABOLIC PANEL
ALT: 27 U/L (ref 0–35)
AST: 27 U/L (ref 0–37)
Albumin: 4.6 g/dL (ref 3.5–5.2)
Alkaline Phosphatase: 85 U/L (ref 39–117)
BUN: 13 mg/dL (ref 6–23)
CO2: 27 mEq/L (ref 19–32)
Calcium: 10.9 mg/dL — ABNORMAL HIGH (ref 8.4–10.5)
Chloride: 104 mEq/L (ref 96–112)
Creatinine, Ser: 0.71 mg/dL (ref 0.40–1.20)
GFR: 79.88 mL/min (ref >60.00)
Glucose, Bld: 106 mg/dL — ABNORMAL HIGH (ref 70–99)
Potassium: 4 mEq/L (ref 3.5–5.1)
Sodium: 138 mEq/L (ref 135–145)
Total Bilirubin: 0.6 mg/dL (ref 0.2–1.2)
Total Protein: 7.2 g/dL (ref 6.0–8.3)

## 2019-10-18 LAB — LIPID PANEL
Cholesterol: 139 mg/dL (ref 0–200)
HDL: 36.4 mg/dL — ABNORMAL LOW (ref >39.00)
LDL Cholesterol: 81 mg/dL (ref 0–99)
NonHDL: 102.46
Total CHOL/HDL Ratio: 4
Triglycerides: 105 mg/dL (ref 0.0–149.0)
VLDL: 21 mg/dL (ref 0.0–40.0)

## 2019-10-18 LAB — CBC WITH DIFFERENTIAL/PLATELET
Basophils Absolute: 0 10*3/uL (ref 0.0–0.1)
Basophils Relative: 0.7 % (ref 0.0–3.0)
Eosinophils Absolute: 0.1 10*3/uL (ref 0.0–0.7)
Eosinophils Relative: 2.7 % (ref 0.0–5.0)
HCT: 42.7 % (ref 36.0–46.0)
Hemoglobin: 14.9 g/dL (ref 12.0–15.0)
Lymphocytes Relative: 27.1 % (ref 12.0–46.0)
Lymphs Abs: 1.5 10*3/uL (ref 0.7–4.0)
MCHC: 34.8 g/dL (ref 30.0–36.0)
MCV: 94.3 fl (ref 78.0–100.0)
Monocytes Absolute: 0.5 10*3/uL (ref 0.1–1.0)
Monocytes Relative: 8.3 % (ref 3.0–12.0)
Neutro Abs: 3.3 10*3/uL (ref 1.4–7.7)
Neutrophils Relative %: 61.2 % (ref 43.0–77.0)
Platelets: 250 10*3/uL (ref 150.0–400.0)
RBC: 4.52 Mil/uL (ref 3.87–5.11)
RDW: 12.3 % (ref 11.5–15.5)
WBC: 5.5 10*3/uL (ref 4.0–10.5)

## 2019-10-18 LAB — TSH: TSH: 3.23 u[IU]/mL (ref 0.35–4.50)

## 2019-10-18 LAB — VITAMIN D 25 HYDROXY (VIT D DEFICIENCY, FRACTURES): VITD: 36.25 ng/mL (ref 30.00–100.00)

## 2019-10-19 LAB — HEMOGLOBIN A1C: Hgb A1c MFr Bld: 5.5 % (ref 4.6–6.5)

## 2019-10-23 ENCOUNTER — Other Ambulatory Visit: Payer: Self-pay | Admitting: Cardiovascular Disease

## 2019-10-23 ENCOUNTER — Encounter: Payer: Self-pay | Admitting: Family

## 2019-10-23 ENCOUNTER — Other Ambulatory Visit: Payer: Self-pay

## 2019-10-23 ENCOUNTER — Ambulatory Visit (INDEPENDENT_AMBULATORY_CARE_PROVIDER_SITE_OTHER): Payer: PPO | Admitting: Family

## 2019-10-23 DIAGNOSIS — I10 Essential (primary) hypertension: Secondary | ICD-10-CM | POA: Diagnosis not present

## 2019-10-23 DIAGNOSIS — E210 Primary hyperparathyroidism: Secondary | ICD-10-CM

## 2019-10-23 DIAGNOSIS — Z0000 Encounter for general adult medical examination without abnormal findings: Secondary | ICD-10-CM | POA: Diagnosis not present

## 2019-10-23 NOTE — Progress Notes (Signed)
Subjective:    Patient ID: Melissa Turner, female    DOB: 1940-12-18, 79 y.o.   MRN: [id number]  CC: Melissa Turner is a 79 y.o. female who presents today for physical exam.    HPI: Feels well. No complaints.   HTN- compliant with norvasc 5mg , 25 mg metoprolol. No Cp, palpitations.   Hyperparathyroidism-Has appt with Dr Gabriel Carina 01/2020 and also bone density scheduled with her.      Dr Rockey Situ 09/2019- advised to restart amlodipine Colorectal Cancer Screening: Done in 2009 ; UTD Breast Cancer Screening: Mammogram UTD Cervical Cancer Screening: History of hysterectomy with Dr Paulla Fore at [place] 2019.She doesn't think she needs a pap smear anymore; she is due to see Paulla Fore and will double check with her. No pelvic pain.  Bone Health screening/DEXA for 65+: due Lung Cancer Screening: Doesn't have 30 year pack year history and age > 2 years.            Labs: Screening labs today. Exercise: Gets regular exercise.  Alcohol use: occasional Smoking/tobacco use: Nonsmoker.    HISTORY:  Past Medical History:  Diagnosis Date  . Deviated septum    s/p rhinoplasty  . GERD (gastroesophageal reflux disease)   . History of blood transfusion    during first child's birth  . History of chicken pox   . HTN (hypertension)   . Hypercholesteremia   . Migraines     Past Surgical History:  Procedure Laterality Date  . ABDOMINAL HYSTERECTOMY  11/2017   partial hysterectomy; due to bladder prolapse. has ovaries.   Marland Kitchen BREAST CYST ASPIRATION Left   . BUNIONECTOMY    . DILATION AND CURETTAGE OF UTERUS    . RHINOPLASTY    . TONSILLECTOMY  1950  . VAGINAL DELIVERY     x2   Family History  Problem Relation Age of Onset  . Heart disease Mother   . Diabetes Father   . Heart failure Father   . Dementia Father   . Cancer Other        Breast, liver, lung  . Breast cancer Neg Hx       ALLERGIES: Other  Current Outpatient Medications on File Prior to Visit  Medication Sig  Dispense Refill  . ALPRAZolam (XANAX) 0.25 MG tablet TAKE 1 TABLET(0.25 MG) BY MOUTH DAILY AS NEEDED FOR ANXIETY 30 tablet 1  . amLODipine (NORVASC) 5 MG tablet Take 1 tablet (5 mg total) by mouth daily. 90 tablet 3  . ASPIRIN 81 PO Take 1 tablet by mouth daily.    . Cholecalciferol (VITAMIN D3) 1.25 MG (50000 UT) CAPS Vitamin D3    . metoprolol succinate (TOPROL-XL) 50 MG 24 hr tablet TAKE 1 TABLET BY MOUTH DAILY. TAKE WITH OR IMMEDIATELY FOLLOWING A MEAL (Patient taking differently: Take 25 mg by mouth at bedtime. TAKE 1 TABLET BY MOUTH DAILY. TAKE WITH OR IMMEDIATELY FOLLOWING A MEAL) 90 tablet 0  . rosuvastatin (CRESTOR) 10 MG tablet TAKE 1 TABLET(10 MG) BY MOUTH DAILY 90 tablet 3   No current facility-administered medications on file prior to visit.    Social History   Tobacco Use  . Smoking status: Never Smoker  . Smokeless tobacco: Never Used  Substance Use Topics  . Alcohol use: Yes    Comment: Wine 2 to 3 times a week  . Drug use: No    Review of Systems  Constitutional: Negative for chills, fever and unexpected weight change.  HENT: Negative for congestion.   Respiratory:  Negative for cough.   Cardiovascular: Negative for chest pain, palpitations and leg swelling.  Gastrointestinal: Negative for nausea and vomiting.  Genitourinary: Negative for difficulty urinating and pelvic pain.  Musculoskeletal: Negative for arthralgias and myalgias.  Skin: Negative for rash.  Neurological: Negative for headaches.  Hematological: Negative for adenopathy.  Psychiatric/Behavioral: Negative for confusion.      Objective:    BP 134/66   Pulse (!) 104   Temp (!) 97.3 F (36.3 C) (Temporal)   Ht 4\' 11"  (1.499 m)   Wt 121 lb 9.6 oz (55.2 kg)   SpO2 98%   BMI 24.56 kg/m   BP Readings from Last 3 Encounters:  10/23/19 134/66  10/16/19 (!) 160/70  10/12/18 (!) 144/82   Wt Readings from Last 3 Encounters:  10/23/19 121 lb 9.6 oz (55.2 kg)  10/16/19 121 lb (54.9 kg)  10/12/18  119 lb (54 kg)    Physical Exam Vitals reviewed.  Constitutional:      Appearance: She is well-developed.  Eyes:     Conjunctiva/sclera: Conjunctivae normal.  Neck:     Thyroid: No thyroid mass or thyromegaly.  Cardiovascular:     Rate and Rhythm: Normal rate and regular rhythm.     Pulses: Normal pulses.     Heart sounds: Normal heart sounds.  Pulmonary:     Effort: Pulmonary effort is normal.     Breath sounds: Normal breath sounds. No wheezing, rhonchi or rales.  Chest:     Breasts: Breasts are symmetrical.        Right: No inverted nipple, mass, nipple discharge, skin change or tenderness.        Left: No inverted nipple, mass, nipple discharge, skin change or tenderness.  Lymphadenopathy:     Head:     Right side of head: No submental, submandibular, tonsillar, preauricular, posterior auricular or occipital adenopathy.     Left side of head: No submental, submandibular, tonsillar, preauricular, posterior auricular or occipital adenopathy.     Cervical: No cervical adenopathy.     Right cervical: No superficial, deep or posterior cervical adenopathy.    Left cervical: No superficial, deep or posterior cervical adenopathy.  Skin:    General: Skin is warm and dry.  Neurological:     Mental Status: She is alert.  Psychiatric:        Speech: Speech normal.        Behavior: Behavior normal.        Thought Content: Thought content normal.        Assessment & Plan:   Problem List Items Addressed This Visit      Cardiovascular and Mediastinum   Hypertension    At goal, continue metoprolol, amlodipine.        Endocrine   Primary hyperparathyroidism (HCC)    Chronic.  Calcium remains elevated which she is aware.  Patient has upcoming appoint with Dr. Gabriel Carina. Will follow        Other   Routine general medical examination at a health care facility    Clinical breast exam performed.  Deferred pelvic exam in the absence of complaints and also patient had hysterectomy  and does not think she has a cervix.  She follows with urogynecology and will double check with [facility] Lelon Huh at [facility] in regards to this and let me know so I can notate on chart.          I am having Rai Repasky. Feliz maintain her ASPIRIN 81 PO, rosuvastatin, Vitamin D3, ALPRAZolam,  metoprolol succinate, and amLODipine.   No orders of the defined types were placed in this encounter.   Return precautions given.   Risks, benefits, and alternatives of the medications and treatment plan prescribed today were discussed, and patient expressed understanding.   Education regarding symptom management and diagnosis given to patient on AVS.   Continue to follow with Burnard Hawthorne, FNP for routine health maintenance.   [place] and I agreed with plan.   Mable Paris, FNP

## 2019-10-23 NOTE — Patient Instructions (Addendum)
As we discussed, please call and schedule your follow up with URO GYN Dr Paulla Fore. Ensure discuss whether you need pap smear in the future and let me know.  Great to see you!   If you have further questions or concerns, please call the Urogynecology Triage Nurse:  [facility] Triage Nurse: [phone]  [facility] Triage Nurse: [phone]    Health Maintenance for Postmenopausal Women Menopause is a normal process in which your ability to get pregnant comes to an end. This process happens slowly over many months or years, usually between the ages of 31 and 11. Menopause is complete when you have missed your menstrual periods for 12 months. It is important to talk with your health care provider about some of the most common conditions that affect women after menopause (postmenopausal women). These include heart disease, cancer, and bone loss (osteoporosis). Adopting a healthy lifestyle and getting preventive care can help to promote your health and wellness. The actions you take can also lower your chances of developing some of these common conditions. What should I know about menopause? During menopause, you may get a number of symptoms, such as:  Hot flashes. These can be moderate or severe.  Night sweats.  Decrease in sex drive.  Mood swings.  Headaches.  Tiredness.  Irritability.  Memory problems.  Insomnia. Choosing to treat or not to treat these symptoms is a decision that you make with your health care provider. Do I need hormone replacement therapy?  Hormone replacement therapy is effective in treating symptoms that are caused by menopause, such as hot flashes and night sweats.  Hormone replacement carries certain risks, especially as you become older. If you are thinking about using estrogen or estrogen with progestin, discuss the benefits and risks with your health care provider. What is my risk for heart disease and stroke? The risk of heart disease, heart attack, and  stroke increases as you age. One of the causes may be a change in the body's hormones during menopause. This can affect how your body uses dietary fats, triglycerides, and cholesterol. Heart attack and stroke are medical emergencies. There are many things that you can do to help prevent heart disease and stroke. Watch your blood pressure  High blood pressure causes heart disease and increases the risk of stroke. This is more likely to develop in people who have high blood pressure readings, are of African descent, or are overweight.  Have your blood pressure checked: ? Every 3-5 years if you are 59-60 years of age. ? Every year if you are 38 years old or older. Eat a healthy diet   Eat a diet that includes plenty of vegetables, fruits, low-fat dairy products, and lean protein.  Do not eat a lot of foods that are high in solid fats, added sugars, or sodium. Get regular exercise Get regular exercise. This is one of the most important things you can do for your health. Most adults should:  Try to exercise for at least 150 minutes each week. The exercise should increase your heart rate and make you sweat (moderate-intensity exercise).  Try to do strengthening exercises at least twice each week. Do these in addition to the moderate-intensity exercise.  Spend less time sitting. Even light physical activity can be beneficial. Other tips  Work with your health care provider to achieve or maintain a healthy weight.  Do not use any products that contain nicotine or tobacco, such as cigarettes, e-cigarettes, and chewing tobacco. If you need help quitting, ask your  health care provider.  Know your numbers. Ask your health care provider to check your cholesterol and your blood sugar (glucose). Continue to have your blood tested as directed by your health care provider. Do I need screening for cancer? Depending on your health history and family history, you may need to have cancer screening at  different stages of your life. This may include screening for:  Breast cancer.  Cervical cancer.  Lung cancer.  Colorectal cancer. What is my risk for osteoporosis? After menopause, you may be at increased risk for osteoporosis. Osteoporosis is a condition in which bone destruction happens more quickly than new bone creation. To help prevent osteoporosis or the bone fractures that can happen because of osteoporosis, you may take the following actions:  If you are 1-43 years old, get at least 1,000 mg of calcium and at least 600 mg of vitamin D per day.  If you are older than age 76 but younger than age 56, get at least 1,200 mg of calcium and at least 600 mg of vitamin D per day.  If you are older than age 81, get at least 1,200 mg of calcium and at least 800 mg of vitamin D per day. Smoking and drinking excessive alcohol increase the risk of osteoporosis. Eat foods that are rich in calcium and vitamin D, and do weight-bearing exercises several times each week as directed by your health care provider. How does menopause affect my mental health? Depression may occur at any age, but it is more common as you become older. Common symptoms of depression include:  Low or sad mood.  Changes in sleep patterns.  Changes in appetite or eating patterns.  Feeling an overall lack of motivation or enjoyment of activities that you previously enjoyed.  Frequent crying spells. Talk with your health care provider if you think that you are experiencing depression. General instructions See your health care provider for regular wellness exams and vaccines. This may include:  Scheduling regular health, dental, and eye exams.  Getting and maintaining your vaccines. These include: ? Influenza vaccine. Get this vaccine each year before the flu season begins. ? Pneumonia vaccine. ? Shingles vaccine. ? Tetanus, diphtheria, and pertussis (Tdap) booster vaccine. Your health care provider may also  recommend other immunizations. Tell your health care provider if you have ever been abused or do not feel safe at home. Summary  Menopause is a normal process in which your ability to get pregnant comes to an end.  This condition causes hot flashes, night sweats, decreased interest in sex, mood swings, headaches, or lack of sleep.  Treatment for this condition may include hormone replacement therapy.  Take actions to keep yourself healthy, including exercising regularly, eating a healthy diet, watching your weight, and checking your blood pressure and blood sugar levels.  Get screened for cancer and depression. Make sure that you are up to date with all your vaccines. This information is not intended to replace advice given to you by your health care provider. Make sure you discuss any questions you have with your health care provider. Document Revised: 08/03/2018 Document Reviewed: 08/03/2018 Elsevier Patient Education  2020 [employer].

## 2019-10-23 NOTE — Assessment & Plan Note (Signed)
At goal, continue metoprolol, amlodipine.

## 2019-10-23 NOTE — Assessment & Plan Note (Signed)
Chronic.  Calcium remains elevated which she is aware.  Patient has upcoming appoint with Dr. Gabriel Carina. Will follow

## 2019-10-23 NOTE — Assessment & Plan Note (Signed)
Clinical breast exam performed.  Deferred pelvic exam in the absence of complaints and also patient had hysterectomy and does not think she has a cervix.  She follows with urogynecology and will double check with Dr. Lelon Huh at [facility] in regards to this and let me know so I can notate on chart.

## 2019-10-30 ENCOUNTER — Other Ambulatory Visit: Payer: Self-pay | Admitting: Family

## 2019-10-30 DIAGNOSIS — F419 Anxiety disorder, unspecified: Secondary | ICD-10-CM

## 2019-10-30 NOTE — Telephone Encounter (Signed)
Refill request for xanax, last seen 10-23-19 , last filled 02-27-2019 .  Please advise.

## 2019-10-31 NOTE — Telephone Encounter (Signed)
I looked up patient on [place] Controlled Substances Reporting System and saw no activity that raised concern of inappropriate use.   

## 2019-11-21 ENCOUNTER — Ambulatory Visit: Payer: PPO | Admitting: Dermatology

## 2019-11-21 ENCOUNTER — Other Ambulatory Visit: Payer: Self-pay

## 2019-11-21 ENCOUNTER — Telehealth: Payer: Self-pay | Admitting: Family

## 2019-11-21 ENCOUNTER — Ambulatory Visit (INDEPENDENT_AMBULATORY_CARE_PROVIDER_SITE_OTHER): Payer: PPO | Admitting: Primary Care

## 2019-11-21 ENCOUNTER — Encounter: Payer: Self-pay | Admitting: Primary Care

## 2019-11-21 VITALS — BP 130/72 | HR 102 | Temp 97.1°F | Ht 59.0 in | Wt 122.5 lb

## 2019-11-21 DIAGNOSIS — R300 Dysuria: Secondary | ICD-10-CM | POA: Diagnosis not present

## 2019-11-21 DIAGNOSIS — L578 Other skin changes due to chronic exposure to nonionizing radiation: Secondary | ICD-10-CM

## 2019-11-21 DIAGNOSIS — L988 Other specified disorders of the skin and subcutaneous tissue: Secondary | ICD-10-CM

## 2019-11-21 DIAGNOSIS — L719 Rosacea, unspecified: Secondary | ICD-10-CM | POA: Diagnosis not present

## 2019-11-21 DIAGNOSIS — L820 Inflamed seborrheic keratosis: Secondary | ICD-10-CM | POA: Diagnosis not present

## 2019-11-21 LAB — POC URINALSYSI DIPSTICK (AUTOMATED)
Bilirubin, UA: NEGATIVE
Glucose, UA: NEGATIVE
Ketones, UA: NEGATIVE
Nitrite, UA: POSITIVE
Protein, UA: POSITIVE — AB
Spec Grav, UA: 1.01 (ref 1.010–1.025)
Urobilinogen, UA: 0.2 E.U./dL
pH, UA: 6 (ref 5.0–8.0)

## 2019-11-21 MED ORDER — SULFAMETHOXAZOLE-TRIMETHOPRIM 800-160 MG PO TABS: 1.0000 | ORAL_TABLET | Freq: Two times a day (BID) | ORAL | 0 refills | Status: DC

## 2019-11-21 NOTE — Assessment & Plan Note (Signed)
Acute for the last 3-4 days, no other symptoms. UA today with 3+ leuks, positive nitrites (on AZO), 3+ blood. Culture sent.  Given history coupled with UA and symptoms, we will treat. Last urine culture with E coli positive infection, sensitivities to Bactrim DS.  Rx for Bactrim DS sent to pharmacy.  Push water intake. Follow up PRN.

## 2019-11-21 NOTE — Telephone Encounter (Signed)
Pt called wanting to make an appt or drop a urine off she thinks that she has a UTI. No available appt

## 2019-11-21 NOTE — Progress Notes (Signed)
Subjective:    Patient ID: Melissa Turner, female    DOB: 12/17/1940, 79 y.o.   MRN: [id number]  HPI  This visit occurred during the SARS-CoV-2 public health emergency.  Safety protocols were in place, including screening questions prior to the visit, additional usage of staff PPE, and extensive cleaning of exam room while observing appropriate contact time as indicated for disinfecting solutions.   [name] is a 79 year old female patient of Melissa Turner with a medical history of hyperparathyroidism, hypertension, dysuria, anxiety, suprapubic pain, acute cystitis (Cultre E coli positive) who presents today with a chief complaint of urinary discomfort.   Symptom began about 3-4 days ago. She's been taking AZO with temporary improvement. She denies urinary frequency, hematuria, vaginal discharge, vaginal itching.   Review of Systems  Constitutional: Negative for fever.  Gastrointestinal: Negative for abdominal pain.  Genitourinary: Negative for dysuria, flank pain, frequency, hematuria, vaginal bleeding and vaginal discharge.       "discomfort with urination"       Past Medical History:  Diagnosis Date  . Deviated septum    s/p rhinoplasty  . GERD (gastroesophageal reflux disease)   . History of blood transfusion    during first child's birth  . History of chicken pox   . HTN (hypertension)   . Hypercholesteremia   . Migraines      Social History   Socioeconomic History  . Marital status: Married    Spouse name: Not on file  . Number of children: 2  . Years of education: Not on file  . Highest education level: Not on file  Occupational History  . Occupation: [employer]  Tobacco Use  . Smoking status: Never Smoker  . Smokeless tobacco: Never Used  Substance and Sexual Activity  . Alcohol use: Yes    Comment: Wine 2 to 3 times a week  . Drug use: No  . Sexual activity: Not Currently  Other Topics Concern  . Not on file  Social  History Narrative   Lives with husband and 2 grandchildren live with her- 83 total grandchildren. Works at [employer] in [employer], retired 2020      Regular Exercise -  Aerobic 30 min every day, walking   Daily Caffeine Use:  1 cup sw tea daily         Social Determinants of Health   Financial Resource Strain:   . Difficulty of Paying Living Expenses:   Food Insecurity:   . Worried About Charity fundraiser in the Last Year:   . Arboriculturist in the Last Year:   Transportation Needs:   . Film/video editor (Medical):   Marland Kitchen Lack of Transportation (Non-Medical):   Physical Activity:   . Days of Exercise per Week:   . Minutes of Exercise per Session:   Stress:   . Feeling of Stress :   Social Connections:   . Frequency of Communication with Friends and Family:   . Frequency of Social Gatherings with Friends and Family:   . Attends Religious Services:   . Active Member of Clubs or Organizations:   . Attends Archivist Meetings:   Marland Kitchen Marital Status:   Intimate Partner Violence:   . Fear of Current or Ex-Partner:   . Emotionally Abused:   Marland Kitchen Physically Abused:   . Sexually Abused:     Past Surgical History:  Procedure Laterality Date  . ABDOMINAL HYSTERECTOMY  11/2017  partial hysterectomy; due to bladder prolapse. has ovaries.   Marland Kitchen BREAST CYST ASPIRATION Left   . BUNIONECTOMY    . DILATION AND CURETTAGE OF UTERUS    . RHINOPLASTY    . TONSILLECTOMY  1950  . VAGINAL DELIVERY     x2    Family History  Problem Relation Age of Onset  . Heart disease Mother   . Diabetes Father   . Heart failure Father   . Dementia Father   . Cancer Other        Breast, liver, lung  . Breast cancer Neg Hx     Allergies  Allergen Reactions  . Other     Current Outpatient Medications on File Prior to Visit  Medication Sig Dispense Refill  . ALPRAZolam (XANAX) 0.25 MG tablet TAKE 1 TABLET(0.25 MG) BY MOUTH DAILY AS NEEDED FOR ANXIETY 30 tablet 1  .  amLODipine (NORVASC) 5 MG tablet Take 1 tablet (5 mg total) by mouth daily. 90 tablet 3  . ASPIRIN 81 PO Take 1 tablet by mouth daily.    . Cholecalciferol (VITAMIN D3) 1.25 MG (50000 UT) CAPS Vitamin D3    . metoprolol succinate (TOPROL-XL) 50 MG 24 hr tablet TAKE 1 TABLET BY MOUTH DAILY. TAKE WITH OR IMMEDIATELY FOLLOWING A MEAL (Patient taking differently: Take 25 mg by mouth at bedtime. TAKE 1 TABLET BY MOUTH DAILY. TAKE WITH OR IMMEDIATELY FOLLOWING A MEAL) 90 tablet 0  . rosuvastatin (CRESTOR) 10 MG tablet TAKE 1 TABLET(10 MG) BY MOUTH DAILY 90 tablet 3   No current facility-administered medications on file prior to visit.    BP 130/72   Pulse (!) 102   Temp (!) 97.1 F (36.2 C) (Temporal)   Ht 4\' 11"  (1.499 m)   Wt 122 lb 8 oz (55.6 kg)   SpO2 98%   BMI 24.74 kg/m    Objective:   Physical Exam  Constitutional: She appears well-nourished.  Cardiovascular: Normal rate and regular rhythm.  Respiratory: Effort normal and breath sounds normal.  GI: There is no CVA tenderness.  Musculoskeletal:     Cervical back: Neck supple.  Skin: Skin is warm and dry.           Assessment & Plan:

## 2019-11-21 NOTE — Progress Notes (Signed)
   Follow-Up Visit   Subjective  Melissa Turner is a 79 y.o. female who presents for the following: Procedure (Botox - frown comples and crows feet) and Other (Spot - left upper arm. Feels rough).    The following portions of the chart were reviewed this encounter and updated as appropriate:     Review of Systems: No other skin or systemic complaints.  Objective  Well appearing patient in no apparent distress; mood and affect are within normal limits.  A focused examination was performed including face, left arm, left leg. Relevant physical exam findings are noted in the Assessment and Plan.  Objective  Head - Anterior (Face): Diffuse scaly erythematous macules with underlying dyspigmentation. Actinic changes  Objective  Frown complex, bilateral crows feet: Rhytids.  Images                  Objective  Mid Tip of Nose: Dilated blood vessels. Dr. Laurence Ferrari has viewed under dermoscopy in the past. Benign appearing.  Images    Assessment & Plan  Actinic skin damage (2) Head - Anterior (Face)  Recommend broad spectrum SPF and photoprotection   Elastosis of skin Frown complex, bilateral crows feet  Botox Injection - Frown complex, bilateral crows feet Location: See attached image  Informed consent: Discussed risks (infection, pain, bleeding, bruising, swelling, allergic reaction, paralysis of nearby muscles, eyelid droop, double vision, neck weakness, difficulty breathing, headache, undesirable cosmetic result, and need for additional treatment) and benefits of the procedure, as well as the alternatives.  Informed consent was obtained.  Preparation: The area was cleansed with alcohol.  Procedure Details:  Botox was injected into the dermis with a 30-gauge needle. Pressure applied to any bleeding. Ice packs offered for swelling.  Lot Number:  [id number] Expiration: 07/2022  Total Units Injected:  40 units  Plan: Patient was instructed to remain upright  for 4 hours. Patient was instructed to avoid massaging the face and avoid vigorous exercise for the rest of the day. Tylenol may be used for headache.  Allow 2 weeks before returning to clinic for additional dosing as needed. Patient will call for any problems.   Inflamed seborrheic keratosis (3) Left Deltoid (2); Left Pretibial  Destruction of lesion - Left Deltoid, Left Pretibial Complexity: simple   Destruction method: cryotherapy   Informed consent: discussed and consent obtained   Timeout:  patient name, date of birth, surgical site, and procedure verified Lesion destroyed using liquid nitrogen: Yes   Region frozen until ice ball extended beyond lesion: Yes   Outcome: patient tolerated procedure well with no complications   Post-procedure details: wound care instructions given    Rosacea Mid Tip of Nose  Observe   Return in about 3 months (around 02/21/2020).   I, Ashok Cordia, CMA, am acting as scribe for Sarina Ser, MD .

## 2019-11-21 NOTE — Telephone Encounter (Signed)
I can advise UC or are you okay with patient leaving urine?

## 2019-11-21 NOTE — Patient Instructions (Signed)
Start Bactrim DS (sulfamethoxazole/trimethoprim) tablets for urinary tract infection. Take 1 tablet by mouth twice daily for 5 days.  Be sure to stay hydrated well with water.   It was a pleasure meeting you!

## 2019-11-21 NOTE — Telephone Encounter (Signed)
Pt called back and I let her know what Joycelyn Schmid suggested she do. She said ok and thanks. If you need to call her back you can.

## 2019-11-21 NOTE — Telephone Encounter (Signed)
LMTCB

## 2019-11-21 NOTE — Telephone Encounter (Signed)
I called pt back and got her in with [facility] Melissa Turner today @ 3:20pm.

## 2019-11-21 NOTE — Telephone Encounter (Signed)
Perfect. Noted!

## 2019-11-21 NOTE — Telephone Encounter (Signed)
Call pt Unfortunately she would need appointment for evaluation of UTI Advise UC across the street please

## 2019-11-24 LAB — URINE CULTURE
MICRO NUMBER:: 10307701
SPECIMEN QUALITY:: ADEQUATE

## 2019-11-29 ENCOUNTER — Telehealth: Payer: Self-pay | Admitting: Family

## 2019-11-29 NOTE — Telephone Encounter (Signed)
Left message for patient to call back and schedule Medicare Annual Wellness Visit (AWV) either virtually or audio only.  Last AWV 06/25/16; please schedule at anytime with Denisa O'Brien-Blaney at [facility].

## 2019-12-08 ENCOUNTER — Other Ambulatory Visit: Payer: Self-pay | Admitting: Cardiovascular Disease

## 2020-01-03 DIAGNOSIS — M1712 Unilateral primary osteoarthritis, left knee: Secondary | ICD-10-CM | POA: Diagnosis not present

## 2020-01-03 DIAGNOSIS — M19049 Primary osteoarthritis, unspecified hand: Secondary | ICD-10-CM | POA: Diagnosis not present

## 2020-01-15 DIAGNOSIS — M19049 Primary osteoarthritis, unspecified hand: Secondary | ICD-10-CM | POA: Diagnosis not present

## 2020-01-15 DIAGNOSIS — M19042 Primary osteoarthritis, left hand: Secondary | ICD-10-CM | POA: Diagnosis not present

## 2020-01-24 DIAGNOSIS — M8589 Other specified disorders of bone density and structure, multiple sites: Secondary | ICD-10-CM | POA: Diagnosis not present

## 2020-01-24 DIAGNOSIS — E210 Primary hyperparathyroidism: Secondary | ICD-10-CM | POA: Diagnosis not present

## 2020-01-24 DIAGNOSIS — M810 Age-related osteoporosis without current pathological fracture: Secondary | ICD-10-CM | POA: Diagnosis not present

## 2020-01-31 DIAGNOSIS — E210 Primary hyperparathyroidism: Secondary | ICD-10-CM | POA: Diagnosis not present

## 2020-01-31 DIAGNOSIS — M810 Age-related osteoporosis without current pathological fracture: Secondary | ICD-10-CM | POA: Diagnosis not present

## 2020-02-23 DIAGNOSIS — E213 Hyperparathyroidism, unspecified: Secondary | ICD-10-CM | POA: Diagnosis not present

## 2020-02-27 DIAGNOSIS — E213 Hyperparathyroidism, unspecified: Secondary | ICD-10-CM | POA: Diagnosis not present

## 2020-03-11 DIAGNOSIS — M545 Low back pain: Secondary | ICD-10-CM | POA: Diagnosis not present

## 2020-03-11 DIAGNOSIS — M9903 Segmental and somatic dysfunction of lumbar region: Secondary | ICD-10-CM | POA: Diagnosis not present

## 2020-03-11 DIAGNOSIS — M9905 Segmental and somatic dysfunction of pelvic region: Secondary | ICD-10-CM | POA: Diagnosis not present

## 2020-03-11 DIAGNOSIS — M5431 Sciatica, right side: Secondary | ICD-10-CM | POA: Diagnosis not present

## 2020-03-13 DIAGNOSIS — M9905 Segmental and somatic dysfunction of pelvic region: Secondary | ICD-10-CM | POA: Diagnosis not present

## 2020-03-13 DIAGNOSIS — M9903 Segmental and somatic dysfunction of lumbar region: Secondary | ICD-10-CM | POA: Diagnosis not present

## 2020-03-13 DIAGNOSIS — M5431 Sciatica, right side: Secondary | ICD-10-CM | POA: Diagnosis not present

## 2020-03-13 DIAGNOSIS — M545 Low back pain: Secondary | ICD-10-CM | POA: Diagnosis not present

## 2020-03-18 DIAGNOSIS — M9903 Segmental and somatic dysfunction of lumbar region: Secondary | ICD-10-CM | POA: Diagnosis not present

## 2020-03-18 DIAGNOSIS — M545 Low back pain: Secondary | ICD-10-CM | POA: Diagnosis not present

## 2020-03-18 DIAGNOSIS — M9905 Segmental and somatic dysfunction of pelvic region: Secondary | ICD-10-CM | POA: Diagnosis not present

## 2020-03-18 DIAGNOSIS — M5431 Sciatica, right side: Secondary | ICD-10-CM | POA: Diagnosis not present

## 2020-03-22 DIAGNOSIS — M9905 Segmental and somatic dysfunction of pelvic region: Secondary | ICD-10-CM | POA: Diagnosis not present

## 2020-03-22 DIAGNOSIS — M5431 Sciatica, right side: Secondary | ICD-10-CM | POA: Diagnosis not present

## 2020-03-22 DIAGNOSIS — M545 Low back pain: Secondary | ICD-10-CM | POA: Diagnosis not present

## 2020-03-22 DIAGNOSIS — M9903 Segmental and somatic dysfunction of lumbar region: Secondary | ICD-10-CM | POA: Diagnosis not present

## 2020-03-25 DIAGNOSIS — M9905 Segmental and somatic dysfunction of pelvic region: Secondary | ICD-10-CM | POA: Diagnosis not present

## 2020-03-25 DIAGNOSIS — M5431 Sciatica, right side: Secondary | ICD-10-CM | POA: Diagnosis not present

## 2020-03-25 DIAGNOSIS — M9903 Segmental and somatic dysfunction of lumbar region: Secondary | ICD-10-CM | POA: Diagnosis not present

## 2020-03-25 DIAGNOSIS — M545 Low back pain: Secondary | ICD-10-CM | POA: Diagnosis not present

## 2020-03-27 DIAGNOSIS — Z01812 Encounter for preprocedural laboratory examination: Secondary | ICD-10-CM | POA: Diagnosis not present

## 2020-03-27 DIAGNOSIS — Z20822 Contact with and (suspected) exposure to covid-19: Secondary | ICD-10-CM | POA: Diagnosis not present

## 2020-03-29 DIAGNOSIS — I10 Essential (primary) hypertension: Secondary | ICD-10-CM | POA: Diagnosis not present

## 2020-03-29 DIAGNOSIS — E210 Primary hyperparathyroidism: Secondary | ICD-10-CM | POA: Diagnosis not present

## 2020-03-29 DIAGNOSIS — E7800 Pure hypercholesterolemia, unspecified: Secondary | ICD-10-CM | POA: Diagnosis not present

## 2020-03-29 DIAGNOSIS — D351 Benign neoplasm of parathyroid gland: Secondary | ICD-10-CM | POA: Diagnosis not present

## 2020-03-29 HISTORY — PX: PARATHYROIDECTOMY: SHX19

## 2020-04-16 DIAGNOSIS — E213 Hyperparathyroidism, unspecified: Secondary | ICD-10-CM | POA: Diagnosis not present

## 2020-04-23 ENCOUNTER — Other Ambulatory Visit: Payer: Self-pay | Admitting: Family

## 2020-04-23 ENCOUNTER — Ambulatory Visit: Payer: PPO | Admitting: Dermatology

## 2020-04-23 DIAGNOSIS — Z1231 Encounter for screening mammogram for malignant neoplasm of breast: Secondary | ICD-10-CM

## 2020-04-30 ENCOUNTER — Ambulatory Visit: Payer: PPO | Admitting: Dermatology

## 2020-05-04 DIAGNOSIS — R112 Nausea with vomiting, unspecified: Secondary | ICD-10-CM | POA: Diagnosis not present

## 2020-05-04 DIAGNOSIS — B9689 Other specified bacterial agents as the cause of diseases classified elsewhere: Secondary | ICD-10-CM | POA: Diagnosis not present

## 2020-05-04 DIAGNOSIS — Z20822 Contact with and (suspected) exposure to covid-19: Secondary | ICD-10-CM | POA: Diagnosis not present

## 2020-05-04 DIAGNOSIS — R05 Cough: Secondary | ICD-10-CM | POA: Diagnosis not present

## 2020-05-04 DIAGNOSIS — J019 Acute sinusitis, unspecified: Secondary | ICD-10-CM | POA: Diagnosis not present

## 2020-05-22 ENCOUNTER — Ambulatory Visit
Admission: RE | Admit: 2020-05-22 | Discharge: 2020-05-22 | Disposition: A | Discharge status: Home / Self Care | Payer: PPO | Source: Ambulatory Visit | Attending: Family | Admitting: Family

## 2020-05-22 ENCOUNTER — Other Ambulatory Visit: Payer: Self-pay

## 2020-05-22 DIAGNOSIS — Z1231 Encounter for screening mammogram for malignant neoplasm of breast: Secondary | ICD-10-CM | POA: Diagnosis not present

## 2020-06-04 ENCOUNTER — Other Ambulatory Visit: Payer: Self-pay

## 2020-06-04 ENCOUNTER — Ambulatory Visit (INDEPENDENT_AMBULATORY_CARE_PROVIDER_SITE_OTHER): Payer: Self-pay | Admitting: Dermatology

## 2020-06-04 DIAGNOSIS — L988 Other specified disorders of the skin and subcutaneous tissue: Secondary | ICD-10-CM

## 2020-06-04 NOTE — Progress Notes (Signed)
   Follow-Up Visit   Subjective  Melissa Turner is a 79 y.o. female who presents for the following: Facial Elastosis (Botox today).  The following portions of the chart were reviewed this encounter and updated as appropriate:  Tobacco  Allergies  Meds  Problems  Med Hx  Surg Hx  Fam Hx     Review of Systems:  No other skin or systemic complaints except as noted in HPI or Assessment and Plan.  Objective  Well appearing patient in no apparent distress; mood and affect are within normal limits.  A focused examination was performed including face. Relevant physical exam findings are noted in the Assessment and Plan.  Objective  Face: Rhytides and volume loss.   Images     Assessment & Plan  Elastosis of skin Face  Botox today  Frown complex 20 units Crow's Feet 10 units each side  Botox Injection - Face Location: See attached image  Informed consent: Discussed risks (infection, pain, bleeding, bruising, swelling, allergic reaction, paralysis of nearby muscles, eyelid droop, double vision, neck weakness, difficulty breathing, headache, undesirable cosmetic result, and need for additional treatment) and benefits of the procedure, as well as the alternatives.  Informed consent was obtained.  Preparation: The area was cleansed with alcohol.  Procedure Details:  Botox was injected into the dermis with a 30-gauge needle. Pressure applied to any bleeding. Ice packs offered for swelling.  Lot Number:  [id number] Expiration:  08/2022  Total Units Injected:  40  Plan: Patient was instructed to remain upright for 4 hours. Patient was instructed to avoid massaging the face and avoid vigorous exercise for the rest of the day. Tylenol may be used for headache.  Allow 2 weeks before returning to clinic for additional dosing as needed. Patient will call for any problems.   Return for Botox in 3-4 months.  I, Ashok Cordia, CMA, am acting as scribe for Sarina Ser, MD  .  Documentation: I have reviewed the above documentation for accuracy and completeness, and I agree with the above.  Sarina Ser, MD

## 2020-06-05 ENCOUNTER — Encounter: Payer: Self-pay | Admitting: Dermatology

## 2020-07-01 ENCOUNTER — Other Ambulatory Visit: Payer: Self-pay | Admitting: Family

## 2020-07-01 DIAGNOSIS — F419 Anxiety disorder, unspecified: Secondary | ICD-10-CM

## 2020-07-01 NOTE — Telephone Encounter (Signed)
I have patient scheduled for f/u 12/7. Pt was unsure of conflicting appointment & let us know for sure she can make that time.

## 2020-07-01 NOTE — Telephone Encounter (Signed)
Call pt   I have refilled your xanax without a refill.   However I wanted to remind you that this is controlled substance.   In order for me to prescribe medication,  patients must be seen every 3 months.   Please make follow-up appointment this month for any further refills.    I looked up patient on [employer] Controlled Substances Reporting System and saw no activity that raised concern of inappropriate use.

## 2020-07-23 DIAGNOSIS — E210 Primary hyperparathyroidism: Secondary | ICD-10-CM | POA: Diagnosis not present

## 2020-07-30 ENCOUNTER — Ambulatory Visit (INDEPENDENT_AMBULATORY_CARE_PROVIDER_SITE_OTHER): Payer: PPO | Admitting: Family

## 2020-07-30 ENCOUNTER — Encounter: Payer: Self-pay | Admitting: Family

## 2020-07-30 ENCOUNTER — Other Ambulatory Visit: Payer: Self-pay

## 2020-07-30 VITALS — BP 138/80 | HR 98 | Temp 97.6°F | Ht 59.0 in | Wt 123.8 lb

## 2020-07-30 DIAGNOSIS — E785 Hyperlipidemia, unspecified: Secondary | ICD-10-CM | POA: Diagnosis not present

## 2020-07-30 DIAGNOSIS — L659 Nonscarring hair loss, unspecified: Secondary | ICD-10-CM | POA: Diagnosis not present

## 2020-07-30 DIAGNOSIS — M25562 Pain in left knee: Secondary | ICD-10-CM | POA: Diagnosis not present

## 2020-07-30 DIAGNOSIS — Z7982 Long term (current) use of aspirin: Secondary | ICD-10-CM | POA: Diagnosis not present

## 2020-07-30 DIAGNOSIS — F419 Anxiety disorder, unspecified: Secondary | ICD-10-CM

## 2020-07-30 DIAGNOSIS — G8929 Other chronic pain: Secondary | ICD-10-CM | POA: Diagnosis not present

## 2020-07-30 DIAGNOSIS — I10 Essential (primary) hypertension: Secondary | ICD-10-CM | POA: Diagnosis not present

## 2020-07-30 DIAGNOSIS — E892 Postprocedural hypoparathyroidism: Secondary | ICD-10-CM | POA: Diagnosis not present

## 2020-07-30 DIAGNOSIS — M810 Age-related osteoporosis without current pathological fracture: Secondary | ICD-10-CM | POA: Diagnosis not present

## 2020-07-30 MED ORDER — DICLOFENAC SODIUM 1 % EX GEL: 4.0000 g | Freq: Four times a day (QID) | CUTANEOUS | 3 refills | Status: DC

## 2020-07-30 NOTE — Assessment & Plan Note (Signed)
Slightly elevated today. Historically controlled. Advised patient to monitor at home and we will recheck at interim follow up. Continue amlodipine 5mg , toprol 50mg 

## 2020-07-30 NOTE — Patient Instructions (Signed)
Trial of voltaren gel for left knee; remember ice , ice, ice :)  Decrease aspirin to 81mg  due to risk of bleeding on 325 mg.  We can discuss at follow up whether we continue aspirin or may discuss further its discontinuation.   Nice to see you!

## 2020-07-30 NOTE — Progress Notes (Signed)
Subjective:    Patient ID: Melissa Turner, female    DOB: November 21, 1940, 79 y.o.   MRN: [id number]  CC: Melissa Turner is a 79 y.o. female who presents today for follow up.   HPI:  Overall feels well.  Had covid in September; she did not have monoclonal antibodies. She is not covid vaccinated.  Cough resolved.   Left knee pain for years. She has seen orthopedics in the past.  Describes as stiffness. No numbness. No falls or injury. Stiffness and anterior pain worse after sitting for long periods, improves with exercise.   Takes either aspirin 81mg  or 325mg  every day. Has done this daily 'all my life'. Uses aspirin for pain.   No h/o cardiac stent, nor gib.   HLD - compliant with crestor HTN- compliant amlodipine 5mg , toprol. No cp, sob, palpitations. Feels like she has 'white coat syndrome' and blood pressure elevates in office.   GAD- controlled. Takes xanax rarely, approx 2-3 month .No depression. No si/hi  Hyperparathyroidism- h/o parathyroidectomy. DEXA 01/2020 . follows with Dr Gabriel Carina      HISTORY:  Past Medical History:  Diagnosis Date  . Deviated septum    s/p rhinoplasty  . GERD (gastroesophageal reflux disease)   . History of blood transfusion    during first child's birth  . History of chicken pox   . HTN (hypertension)   . Hypercholesteremia   . Migraines    Past Surgical History:  Procedure Laterality Date  . ABDOMINAL HYSTERECTOMY  11/2017   partial hysterectomy; due to bladder prolapse. has ovaries.   Marland Kitchen BREAST CYST ASPIRATION Left   . BUNIONECTOMY    . DILATION AND CURETTAGE OF UTERUS    . RHINOPLASTY    . TONSILLECTOMY  1950  . VAGINAL DELIVERY     x2   Family History  Problem Relation Age of Onset  . Heart disease Mother   . Diabetes Father   . Heart failure Father   . Dementia Father   . Cancer Other        Breast, liver, lung  . Breast cancer Neg Hx     Allergies: Other Current Outpatient Medications on File Prior to Visit  Medication  Sig Dispense Refill  . ALPRAZolam (XANAX) 0.25 MG tablet TAKE 1 TABLET(0.25 MG) BY MOUTH DAILY AS NEEDED FOR ANXIETY 30 tablet 0  . amLODipine (NORVASC) 5 MG tablet Take 1 tablet (5 mg total) by mouth daily. 90 tablet 3  . aspirin EC 81 MG tablet Take 81 mg by mouth daily. Swallow whole.    . Cholecalciferol (VITAMIN D3) 1.25 MG (50000 UT) CAPS Vitamin D3    . metoprolol succinate (TOPROL-XL) 50 MG 24 hr tablet TAKE 1 TABLET BY MOUTH DAILY WITH OR IMMEDIATELY FOLLOWING A MEAL 90 tablet 0  . rosuvastatin (CRESTOR) 10 MG tablet TAKE 1 TABLET(10 MG) BY MOUTH DAILY 90 tablet 3   No current facility-administered medications on file prior to visit.    Social History   Tobacco Use  . Smoking status: Never Smoker  . Smokeless tobacco: Never Used  Vaping Use  . Vaping Use: Never used  Substance Use Topics  . Alcohol use: Yes    Comment: Wine 2 to 3 times a week  . Drug use: No    Review of Systems  Constitutional: Negative for chills and fever.  Respiratory: Negative for cough and shortness of breath.   Cardiovascular: Negative for chest pain, palpitations and leg swelling.  Gastrointestinal: Negative for  nausea and vomiting.  Musculoskeletal: Positive for arthralgias. Negative for joint swelling.  Neurological: Negative for numbness.  Psychiatric/Behavioral: Negative for sleep disturbance and suicidal ideas. The patient is not nervous/anxious.       Objective:    BP 138/80   Pulse 98   Temp 97.6 F (36.4 C)   Ht 4\' 11"  (1.499 m)   Wt 123 lb 12.8 oz (56.2 kg)   SpO2 98%   BMI 25.00 kg/m  BP Readings from Last 3 Encounters:  07/30/20 138/80  11/21/19 130/72  10/23/19 134/66   Wt Readings from Last 3 Encounters:  07/30/20 123 lb 12.8 oz (56.2 kg)  11/21/19 122 lb 8 oz (55.6 kg)  10/23/19 121 lb 9.6 oz (55.2 kg)    Physical Exam Vitals reviewed.  Constitutional:      Appearance: She is well-developed.  Eyes:     Conjunctiva/sclera: Conjunctivae normal.    Cardiovascular:     Rate and Rhythm: Normal rate and regular rhythm.     Pulses: Normal pulses.     Heart sounds: Normal heart sounds.  Pulmonary:     Effort: Pulmonary effort is normal.     Breath sounds: Normal breath sounds. No wheezing, rhonchi or rales.  Musculoskeletal:     Right knee: Normal. No swelling.     Left knee: Bony tenderness present. No swelling. Normal range of motion.     Comments: Bilateral knees are symmetric. No effusion appreciated. No increase in warmth or erythema. No crepitus felt with flexion of bilateral knees.  Left knee:  Able to extend to -5 to 10 degrees and flex to 110 degrees. No catching with McMurray maneuver. No patellar apprehension. No calf tenderness of lower leg edema bilaterally.    Skin:    General: Skin is warm and dry.  Neurological:     Mental Status: She is alert.  Psychiatric:        Speech: Speech normal.        Behavior: Behavior normal.        Thought Content: Thought content normal.        Assessment & Plan:   Problem List Items Addressed This Visit      Cardiovascular and Mediastinum   Hypertension    Slightly elevated today. Historically controlled. Advised patient to monitor at home and we will recheck at interim follow up. Continue amlodipine 5mg , toprol 50mg       Relevant Medications   aspirin EC 81 MG tablet   diclofenac Sodium (VOLTAREN) 1 % GEL   Other Relevant Orders   Comprehensive metabolic panel   Lipid panel   TSH   CBC with Differential/Platelet     Other   Anxiety    Stable. Continue rare use xanax 0.25mg       Encounter for long-term (current) use of aspirin    Discussed ASA use at length today including risks of fatal GIB and hemorrhagic stroke in age > 47. Advised at very least to reduce to 81mg  dose from 325mg  dose. In future, will continue to discuss complete discontinuation in primary prevention.       Hyperlipidemia    Stable. Continue crestor 10mg       Relevant Medications   aspirin  EC 81 MG tablet   Left knee pain - Primary    Chronic. Declines baseline xray or referral to orthopedics today. Suspect osteoarthritis. We discussed icing regimen, trial of voltaren gel. Close follow up      Relevant Medications   diclofenac Sodium (VOLTAREN)  1 % GEL       I have discontinued Mella Inclan. Slinger's ASPIRIN 81 PO, sulfamethoxazole-trimethoprim, and aspirin. I am also having her start on diclofenac Sodium. Additionally, I am having her maintain her Vitamin D3, amLODipine, rosuvastatin, metoprolol succinate, ALPRAZolam, and aspirin EC.   Meds ordered this encounter  Medications  . diclofenac Sodium (VOLTAREN) 1 % GEL    Sig: Apply 4 g topically 4 (four) times daily.    Dispense:  50 g    Refill:  3    Order Specific Question:   Supervising Provider    Answer:   Crecencio Mc [[id number]]    Return precautions given.   Risks, benefits, and alternatives of the medications and treatment plan prescribed today were discussed, and patient expressed understanding.   Education regarding symptom management and diagnosis given to patient on AVS.  Continue to follow with Burnard Hawthorne, FNP for routine health maintenance.   [place] and I agreed with plan.   Mable Paris, FNP

## 2020-07-30 NOTE — Assessment & Plan Note (Signed)
Discussed ASA use at length today including risks of fatal GIB and hemorrhagic stroke in age > 57. Advised at very least to reduce to 81mg  dose from 325mg  dose. In future, will continue to discuss complete discontinuation in primary prevention.

## 2020-07-30 NOTE — Assessment & Plan Note (Signed)
Stable.   - Continue crestor 10mg

## 2020-07-30 NOTE — Assessment & Plan Note (Signed)
Chronic. Declines baseline xray or referral to orthopedics today. Suspect osteoarthritis. We discussed icing regimen, trial of voltaren gel. Close follow up

## 2020-07-30 NOTE — Assessment & Plan Note (Signed)
Stable. Continue rare use xanax 0.25mg 

## 2020-08-15 ENCOUNTER — Other Ambulatory Visit (INDEPENDENT_AMBULATORY_CARE_PROVIDER_SITE_OTHER): Payer: PPO

## 2020-08-15 ENCOUNTER — Other Ambulatory Visit: Payer: Self-pay

## 2020-08-15 DIAGNOSIS — I10 Essential (primary) hypertension: Secondary | ICD-10-CM

## 2020-08-15 LAB — CBC WITH DIFFERENTIAL/PLATELET
Basophils Absolute: 0 10*3/uL (ref 0.0–0.1)
Basophils Relative: 0.8 % (ref 0.0–3.0)
Eosinophils Absolute: 0.2 10*3/uL (ref 0.0–0.7)
Eosinophils Relative: 3.9 % (ref 0.0–5.0)
HCT: 39.9 % (ref 36.0–46.0)
Hemoglobin: 14.1 g/dL (ref 12.0–15.0)
Lymphocytes Relative: 30.3 % (ref 12.0–46.0)
Lymphs Abs: 1.7 10*3/uL (ref 0.7–4.0)
MCHC: 35.3 g/dL (ref 30.0–36.0)
MCV: 92.2 fl (ref 78.0–100.0)
Monocytes Absolute: 0.6 10*3/uL (ref 0.1–1.0)
Monocytes Relative: 9.9 % (ref 3.0–12.0)
Neutro Abs: 3.2 10*3/uL (ref 1.4–7.7)
Neutrophils Relative %: 55.1 % (ref 43.0–77.0)
Platelets: 230 10*3/uL (ref 150.0–400.0)
RBC: 4.33 Mil/uL (ref 3.87–5.11)
RDW: 13 % (ref 11.5–15.5)
WBC: 5.8 10*3/uL (ref 4.0–10.5)

## 2020-08-15 LAB — COMPREHENSIVE METABOLIC PANEL
ALT: 23 U/L (ref 0–35)
AST: 24 U/L (ref 0–37)
Albumin: 4.6 g/dL (ref 3.5–5.2)
Alkaline Phosphatase: 65 U/L (ref 39–117)
BUN: 16 mg/dL (ref 6–23)
CO2: 28 mEq/L (ref 19–32)
Calcium: 9.5 mg/dL (ref 8.4–10.5)
Chloride: 102 mEq/L (ref 96–112)
Creatinine, Ser: 0.74 mg/dL (ref 0.40–1.20)
GFR: 77.95 mL/min (ref >60.00)
Glucose, Bld: 97 mg/dL (ref 70–99)
Potassium: 4 mEq/L (ref 3.5–5.1)
Sodium: 139 mEq/L (ref 135–145)
Total Bilirubin: 0.7 mg/dL (ref 0.2–1.2)
Total Protein: 7 g/dL (ref 6.0–8.3)

## 2020-08-15 LAB — LIPID PANEL
Cholesterol: 157 mg/dL (ref 0–200)
HDL: 37.6 mg/dL — ABNORMAL LOW (ref >39.00)
LDL Cholesterol: 92 mg/dL (ref 0–99)
NonHDL: 118.9
Total CHOL/HDL Ratio: 4
Triglycerides: 137 mg/dL (ref 0.0–149.0)
VLDL: 27.4 mg/dL (ref 0.0–40.0)

## 2020-08-15 LAB — TSH: TSH: 4.83 u[IU]/mL — ABNORMAL HIGH (ref 0.35–4.50)

## 2020-08-24 ENCOUNTER — Other Ambulatory Visit: Payer: Self-pay | Admitting: Family

## 2020-08-24 DIAGNOSIS — R899 Unspecified abnormal finding in specimens from other organs, systems and tissues: Secondary | ICD-10-CM

## 2020-08-27 ENCOUNTER — Telehealth: Payer: Self-pay | Admitting: Family

## 2020-08-27 NOTE — Telephone Encounter (Signed)
Left message for patient to call back and schedule Medicare Annual Wellness Visit (AWV)   This should be a telephone visit only=30 minutes.  Last AWV 06/25/16; please schedule at anytime with Denisa O'Brien-Blaney at [facility].

## 2020-09-17 ENCOUNTER — Other Ambulatory Visit: Payer: Self-pay

## 2020-09-17 ENCOUNTER — Other Ambulatory Visit (INDEPENDENT_AMBULATORY_CARE_PROVIDER_SITE_OTHER): Payer: PPO

## 2020-09-17 DIAGNOSIS — R899 Unspecified abnormal finding in specimens from other organs, systems and tissues: Secondary | ICD-10-CM

## 2020-09-17 LAB — T4, FREE: Free T4: 0.87 ng/dL (ref 0.60–1.60)

## 2020-09-17 LAB — TSH: TSH: 4.29 u[IU]/mL (ref 0.35–4.50)

## 2020-09-17 LAB — T3, FREE: T3, Free: 4 pg/mL (ref 2.3–4.2)

## 2020-09-20 ENCOUNTER — Other Ambulatory Visit: Payer: Self-pay | Admitting: Family

## 2020-09-24 ENCOUNTER — Ambulatory Visit: Payer: PPO | Admitting: Dermatology

## 2020-09-30 ENCOUNTER — Ambulatory Visit: Payer: PPO | Admitting: Family

## 2020-10-01 ENCOUNTER — Ambulatory Visit: Payer: PPO | Admitting: Dermatology

## 2020-10-14 NOTE — Progress Notes (Unsigned)
Cardiology Office Note  Date:  10/15/2020   ID:  Melissa Turner, DOB May 05, 1941, MRN [id number]  PCP:  Burnard Hawthorne, FNP   Chief Complaint  Patient presents with  . Follow-up    Annual follow up and discuss medications. Medications verbally reviewed with patient.     HPI:  Melissa Turner is a 80 year old woman with history of nonsmoker HTN Hyperlipidemia Elevated CT coronary calcium score 104 on scan October 2018 Who presents for follow-up of her coronary artery disease, hypertension  Last seen in clinic by myself February 2021 Parathyroid surgery Aug 2021  Sept 2021, covid Long recovery Joint pain  Blood pressure elevated today At home 063 systolic  Quit metoprolol when she had covid  Lipitor myalgias On crestor, no significant side effects Numbers good  Still with hair loss, etiology unclear Worse with covid  No SOB, no chest pain  EKG personally reviewed by myself on todays visit NSR rate 96 bpm no ST changes   Lab Results  Component Value Date   CHOL 157 08/15/2020   HDL 37.60 (L) 08/15/2020   LDLCALC 92 08/15/2020   TRIG 137.0 08/15/2020   New lab work pending  Other past medical history reviewed Family history reviewed on today's visit Mom with CAD age 90, PCI, smoker Father possible CHF, diabetes GM with MI, 6, died of CVA, smoker  PMH:   has a past medical history of Deviated septum, GERD (gastroesophageal reflux disease), History of blood transfusion, History of chicken pox, HTN (hypertension), Hypercholesteremia, and Migraines.  PSH:    Past Surgical History:  Procedure Laterality Date  . ABDOMINAL HYSTERECTOMY  11/2017   partial hysterectomy; due to bladder prolapse. has ovaries.   Marland Kitchen BREAST CYST ASPIRATION Left   . BUNIONECTOMY    . DILATION AND CURETTAGE OF UTERUS    . RHINOPLASTY    . THYROID SURGERY  03/2020  . TONSILLECTOMY  1950  . VAGINAL DELIVERY     x2    Current Outpatient Medications  Medication Sig Dispense Refill   . ALPRAZolam (XANAX) 0.25 MG tablet TAKE 1 TABLET(0.25 MG) BY MOUTH DAILY AS NEEDED FOR ANXIETY 30 tablet 0  . amLODipine (NORVASC) 5 MG tablet TAKE 1 TABLET(5 MG) BY MOUTH DAILY 90 tablet 3  . aspirin EC 81 MG tablet Take 81 mg by mouth daily. Swallow whole.    . Biotin 5 MG TABS Take 1 tablet by mouth daily.    . Cholecalciferol (VITAMIN D3) 1.25 MG (50000 UT) CAPS Vitamin D3    . rosuvastatin (CRESTOR) 10 MG tablet TAKE 1 TABLET(10 MG) BY MOUTH DAILY 90 tablet 3   No current facility-administered medications for this visit.    Allergies:   Other   Social History:  The patient  reports that she has never smoked. She has never used smokeless tobacco. She reports current alcohol use. She reports that she does not use drugs.   Family History:   family history includes Cancer in an other family member; Dementia in her father; Diabetes in her father; Heart disease in her mother; Heart failure in her father.    Review of Systems: Review of Systems  Constitutional: Negative.   HENT: Negative.   Respiratory: Negative.   Cardiovascular: Negative.   Gastrointestinal: Negative.   Musculoskeletal: Positive for joint pain.  Neurological: Negative.   Psychiatric/Behavioral: Negative.   All other systems reviewed and are negative.   PHYSICAL EXAM: VS:  BP (!) 146/82 (BP Location: Left Arm, Patient Position: Sitting, Cuff  Size: Normal)   Pulse 96   Ht 4\' 11"  (1.499 m)   Wt 124 lb (56.2 kg)   SpO2 98%   BMI 25.04 kg/m  , BMI Body mass index is 25.04 kg/m. Constitutional:  oriented to person, place, and time. No distress.  HENT:  Head: Grossly normal Eyes:  no discharge. No scleral icterus.  Neck: No JVD, no carotid bruits  Cardiovascular: Regular rate and rhythm, no murmurs appreciated Pulmonary/Chest: Clear to auscultation bilaterally, no wheezes or rails Abdominal: Soft.  no distension.  no tenderness.  Musculoskeletal: Normal range of motion Neurological:  normal muscle tone.  Coordination normal. No atrophy Skin: Skin warm and dry Psychiatric: normal affect, pleasant  Recent Labs: 08/15/2020: ALT 23; BUN 16; Creatinine, Ser 0.74; Hemoglobin 14.1; Platelets 230.0; Potassium 4.0; Sodium 139 09/17/2020: TSH 4.29    Lipid Panel Lab Results  Component Value Date   CHOL 157 08/15/2020   HDL 37.60 (L) 08/15/2020   LDLCALC 92 08/15/2020   TRIG 137.0 08/15/2020      Wt Readings from Last 3 Encounters:  10/15/20 124 lb (56.2 kg)  07/30/20 123 lb 12.8 oz (56.2 kg)  11/21/19 122 lb 8 oz (55.6 kg)      ASSESSMENT AND PLAN:  Mixed hyperlipidemia -  Tolerating Crestor 10 mg daily No changes  Essential hypertension - Monitor at home, If elevated restart metoprolol succinate 25  APCs, PVCs She will watch for palpitations  CAD home Mild coronary calcification on CT scan, discussed Calcium score 100 On Crestor, at goal    Total encounter time more than 25 minutes  Greater than 50% was spent in counseling and coordination of care with the patient    No orders of the defined types were placed in this encounter.    Signed, Esmond Plants, M.D., Ph.D. 10/15/2020  [place], [place]

## 2020-10-15 ENCOUNTER — Encounter: Payer: Self-pay | Admitting: Cardiovascular Disease

## 2020-10-15 ENCOUNTER — Other Ambulatory Visit: Payer: Self-pay

## 2020-10-15 ENCOUNTER — Ambulatory Visit: Payer: PPO | Admitting: Cardiovascular Disease

## 2020-10-15 VITALS — BP 146/82 | HR 96 | Ht 59.0 in | Wt 124.0 lb

## 2020-10-15 DIAGNOSIS — I2584 Coronary atherosclerosis due to calcified coronary lesion: Secondary | ICD-10-CM

## 2020-10-15 DIAGNOSIS — I10 Essential (primary) hypertension: Secondary | ICD-10-CM | POA: Diagnosis not present

## 2020-10-15 DIAGNOSIS — I2510 Atherosclerotic heart disease of native coronary artery without angina pectoris: Secondary | ICD-10-CM | POA: Diagnosis not present

## 2020-10-15 DIAGNOSIS — E785 Hyperlipidemia, unspecified: Secondary | ICD-10-CM

## 2020-10-15 NOTE — Patient Instructions (Signed)
Medication Instructions:  No changes  If you need a refill on your cardiac medications before your next appointment, please call your pharmacy.    Lab work: No new labs needed   If you have labs (blood work) drawn today and your tests are completely normal, you will receive your results only by: . MyChart Message (if you have MyChart) OR . A paper copy in the mail If you have any lab test that is abnormal or we need to change your treatment, we will call you to review the results.   Testing/Procedures: No new testing needed   Follow-Up: At [facility], you and your health needs are our priority.  As part of our continuing mission to provide you with exceptional heart care, we have created designated Provider Care Teams.  These Care Teams include your primary Cardiologist (physician) and Advanced Practice Providers (APPs -  Physician Assistants and Nurse Practitioners) who all work together to provide you with the care you need, when you need it.  . You will need a follow up appointment in 12 months  . Providers on your designated Care Team:   . Christopher Berge, NP . Ryan Dunn, PA-C . Jacquelyn Visser, PA-C  Any Other Special Instructions Will Be Listed Below (If Applicable).  COVID-19 Vaccine Information can be found at: [email or web address] For questions related to vaccine distribution or appointments, please email [email or web address] or call [phone].     

## 2020-10-23 ENCOUNTER — Telehealth: Payer: Self-pay | Admitting: Family

## 2020-10-23 ENCOUNTER — Encounter: Payer: Self-pay | Admitting: Family

## 2020-10-23 ENCOUNTER — Ambulatory Visit (INDEPENDENT_AMBULATORY_CARE_PROVIDER_SITE_OTHER): Payer: PPO | Admitting: Family

## 2020-10-23 ENCOUNTER — Other Ambulatory Visit: Payer: Self-pay

## 2020-10-23 VITALS — BP 142/80 | HR 97 | Temp 98.1°F | Ht 59.0 in | Wt 123.4 lb

## 2020-10-23 DIAGNOSIS — I10 Essential (primary) hypertension: Secondary | ICD-10-CM | POA: Diagnosis not present

## 2020-10-23 DIAGNOSIS — Z0000 Encounter for general adult medical examination without abnormal findings: Secondary | ICD-10-CM | POA: Diagnosis not present

## 2020-10-23 DIAGNOSIS — G8929 Other chronic pain: Secondary | ICD-10-CM | POA: Diagnosis not present

## 2020-10-23 DIAGNOSIS — E210 Primary hyperparathyroidism: Secondary | ICD-10-CM | POA: Diagnosis not present

## 2020-10-23 DIAGNOSIS — M25562 Pain in left knee: Secondary | ICD-10-CM

## 2020-10-23 DIAGNOSIS — F419 Anxiety disorder, unspecified: Secondary | ICD-10-CM

## 2020-10-23 DIAGNOSIS — E785 Hyperlipidemia, unspecified: Secondary | ICD-10-CM

## 2020-10-23 DIAGNOSIS — Z7982 Long term (current) use of aspirin: Secondary | ICD-10-CM | POA: Insufficient documentation

## 2020-10-23 DIAGNOSIS — M25561 Pain in right knee: Secondary | ICD-10-CM

## 2020-10-23 MED ORDER — AMLODIPINE BESYLATE 2.5 MG PO TABS: 2.5000 mg | ORAL_TABLET | Freq: Every day | ORAL | 3 refills | Status: DC

## 2020-10-23 NOTE — Telephone Encounter (Signed)
I see pt has a f/u 04/29/21. I am assuming she can have done then?

## 2020-10-23 NOTE — Assessment & Plan Note (Signed)
Uncontrolled. Increase amlodipine to 7.5mg .

## 2020-10-23 NOTE — Progress Notes (Addendum)
Subjective:    Patient ID: Melissa Turner, female    DOB: Apr 16, 1941, 80 y.o.   MRN: [id number]  CC: Melissa Turner is a 80 y.o. female who presents today for physical exam and follow up.    HPI: Feels well today No new concerns.   HTN- compliant with norvasc 5mg . No cp, sob.  BP at home 132-136/ 80 or less.   Continues to have bilateral knee pain which has improved over time.  Pain is controlled with ice , ace wrap, and icy hot. No swelling. Stiffness in the morning.    ASA 81mg  which she uses for joint pain for many years.  No h/o GIB. No h/o stent, CVA.  Family h/o heart disease.   HLD - compliant with crestor 10mg   GAD- Anxiety well controlled. rare use of xanax 0.25mg .         Osteoporosis- she is not on medication. Following with Dr Gabriel Carina H/o parathyroidectomy  Colorectal Cancer Screening: No colon cancer in family. No constipation.  Breast Cancer Screening: Mammogram UTD Cervical Cancer Screening: hysterectomy. No longer screening for cervical cancer.  Bone Health screening/DEXA for 65+: 01/2020        Tetanus - UTD Labs: No screening labs today. Exercise: Gets regular exercise.   Alcohol use:  2-3 times per week Smoking/tobacco use: Nonsmoker.     HISTORY:  Past Medical History:  Diagnosis Date  . Deviated septum    s/p rhinoplasty  . GERD (gastroesophageal reflux disease)   . History of blood transfusion    during first child's birth  . History of chicken pox   . HTN (hypertension)   . Hypercholesteremia   . Migraines     Past Surgical History:  Procedure Laterality Date  . ABDOMINAL HYSTERECTOMY  11/2017   partial hysterectomy; due to bladder prolapse. has bilateral ovaries.   Marland Kitchen BREAST CYST ASPIRATION Left   . BUNIONECTOMY    . DILATION AND CURETTAGE OF UTERUS    . RHINOPLASTY    . THYROID SURGERY  03/2020   parathyroidectomy  . TONSILLECTOMY  1950  . VAGINAL DELIVERY     x2   Family History  Problem Relation Age of Onset  . Heart  disease Mother   . Diabetes Father   . Heart failure Father   . Dementia Father   . Cancer Other        Breast, liver, lung  . Breast cancer Neg Hx       ALLERGIES: Other  Current Outpatient Medications on File Prior to Visit  Medication Sig Dispense Refill  . ALPRAZolam (XANAX) 0.25 MG tablet TAKE 1 TABLET(0.25 MG) BY MOUTH DAILY AS NEEDED FOR ANXIETY 30 tablet 0  . amLODipine (NORVASC) 5 MG tablet TAKE 1 TABLET(5 MG) BY MOUTH DAILY 90 tablet 3  . aspirin EC 81 MG tablet Take 81 mg by mouth daily. Swallow whole.    . Biotin 5 MG TABS Take 1 tablet by mouth daily.    . Cholecalciferol (VITAMIN D3) 1.25 MG (50000 UT) CAPS Vitamin D3    . metoprolol tartrate (LOPRESSOR) 25 MG tablet Take 25 mg by mouth as needed. As needed for palpitations.    . rosuvastatin (CRESTOR) 10 MG tablet TAKE 1 TABLET(10 MG) BY MOUTH DAILY 90 tablet 3   No current facility-administered medications on file prior to visit.    Social History   Tobacco Use  . Smoking status: Never Smoker  . Smokeless tobacco: Never Used  Vaping Use  .  Vaping Use: Never used  Substance Use Topics  . Alcohol use: Yes    Comment: Wine 2 to 3 times a week  . Drug use: No    Review of Systems  Constitutional: Negative for chills and fever.  Respiratory: Negative for cough.   Cardiovascular: Negative for chest pain and palpitations.  Gastrointestinal: Negative for nausea and vomiting.  Musculoskeletal: Positive for arthralgias (knee pain).      Objective:    BP (!) 142/80   Pulse 97   Temp 98.1 F (36.7 C)   Ht 4\' 11"  (1.499 m)   Wt 123 lb 6.4 oz (56 kg)   SpO2 98%   BMI 24.92 kg/m   BP Readings from Last 3 Encounters:  10/23/20 (!) 142/80  10/15/20 (!) 146/82  07/30/20 138/80   Wt Readings from Last 3 Encounters:  10/23/20 123 lb 6.4 oz (56 kg)  10/15/20 124 lb (56.2 kg)  07/30/20 123 lb 12.8 oz (56.2 kg)    Physical Exam Vitals reviewed.  Constitutional:      Appearance: She is well-developed  and well-nourished.  Eyes:     Conjunctiva/sclera: Conjunctivae normal.  Neck:     Thyroid: No thyroid mass or thyromegaly.  Cardiovascular:     Rate and Rhythm: Normal rate and regular rhythm.     Pulses: Normal pulses.     Heart sounds: Normal heart sounds.  Pulmonary:     Effort: Pulmonary effort is normal.     Breath sounds: Normal breath sounds. No wheezing, rhonchi or rales.     Comments: CBE performed.  Chest:  Breasts: Breasts are symmetrical.     Right: No inverted nipple, mass, nipple discharge, skin change or tenderness.     Left: No inverted nipple, mass, nipple discharge, skin change or tenderness.    Lymphadenopathy:     Head:     Right side of head: No submental, submandibular, tonsillar, preauricular, posterior auricular or occipital adenopathy.     Left side of head: No submental, submandibular, tonsillar, preauricular, posterior auricular or occipital adenopathy.     Cervical: No cervical adenopathy.     Right cervical: No superficial, deep or posterior cervical adenopathy.    Left cervical: No superficial, deep or posterior cervical adenopathy.     Upper Body:  No axillary adenopathy present. Skin:    General: Skin is warm and dry.  Neurological:     Mental Status: She is alert.  Psychiatric:        Mood and Affect: Mood and affect normal.        Speech: Speech normal.        Behavior: Behavior normal.        Thought Content: Thought content normal.        Assessment & Plan:   Problem List Items Addressed This Visit      Cardiovascular and Mediastinum   Hypertension - Primary    Uncontrolled. Increase amlodipine to 7.5mg .       Relevant Medications   metoprolol tartrate (LOPRESSOR) 25 MG tablet   amLODipine (NORVASC) 2.5 MG tablet     Endocrine   Primary hyperparathyroidism [facility])    Following with dr Gabriel Carina for osteoporosis , no treatment at this time. Dr Gabriel Carina monitoring DEXA.         Other   Anxiety    Well controlled.continue xanax  0.25mg       Aspirin long-term use    discussed ASA use at length today. Discussed ASPREE trial and risk of  bleeding in age > 69yrs. She does however have h/o CAD. Her preference to remain on asa in setting of family h/o of her fear of MI. We will monitor for falls, GI upset as well as continue to have this conversation. Continue asa 81mg  which I think is reasonable.       Bilateral knee pain    Chronic, controlled. Discussed ice, ace wrap. Patient will let me know how she is doing      Hyperlipidemia    Controlled. Continue crestor 10mg       Relevant Medications   metoprolol tartrate (LOPRESSOR) 25 MG tablet   amLODipine (NORVASC) 2.5 MG tablet   Routine general medical examination at a health care facility    CBE performed. Mammogram UTD. No longer screening for colon cancer, cervical cancer in the absence of complaints. Deferred screening labs since labs done 07/2020 and in 08/2020          I am having Delynda Sepulveda. Manthe start on amLODipine. I am also having her maintain her Vitamin D3, rosuvastatin, ALPRAZolam, aspirin EC, amLODipine, Biotin, and metoprolol tartrate.   Meds ordered this encounter  Medications  . amLODipine (NORVASC) 2.5 MG tablet    Sig: Take 1 tablet (2.5 mg total) by mouth daily.    Dispense:  90 tablet    Refill:  3    Order Specific Question:   Supervising Provider    Answer:   Crecencio Mc [[id number]]    Return precautions given.   Risks, benefits, and alternatives of the medications and treatment plan prescribed today were discussed, and patient expressed understanding.   Education regarding symptom management and diagnosis given to patient on AVS.   Continue to follow with Burnard Hawthorne, FNP for routine health maintenance.   [place] and I agreed with plan.   Mable Paris, FNP

## 2020-10-23 NOTE — Assessment & Plan Note (Signed)
discussed ASA use at length today. Discussed ASPREE trial and risk of bleeding in age > 52yrs. She does however have h/o CAD. Her preference to remain on asa in setting of family h/o of her fear of MI. We will monitor for falls, GI upset as well as continue to have this conversation. Continue asa 81mg  which I think is reasonable.

## 2020-10-23 NOTE — Assessment & Plan Note (Signed)
Chronic, controlled. Discussed ice, ace wrap. Patient will let me know how she is doing

## 2020-10-23 NOTE — Telephone Encounter (Signed)
Patient wanted to know if she need to do lab before her physical 10-27-2021

## 2020-10-23 NOTE — Telephone Encounter (Signed)
That's fine Please sch, I ordered fasting labs

## 2020-10-23 NOTE — Assessment & Plan Note (Signed)
Following with dr Gabriel Carina for osteoporosis , no treatment at this time. Dr Gabriel Carina monitoring DEXA.

## 2020-10-23 NOTE — Patient Instructions (Signed)
Start additional amlodipine 2.5mg  in addition to 5mg  amlodipine.  Goal of blood pressure is less than 130/80   Nice to see you!  h Health Maintenance for Postmenopausal Women Menopause is a normal process in which your ability to get pregnant comes to an end. This process happens slowly over many months or years, usually between the ages of 9 and 92. Menopause is complete when you have missed your menstrual periods for 12 months. It is important to talk with your health care provider about some of the most common conditions that affect women after menopause (postmenopausal women). These include heart disease, cancer, and bone loss (osteoporosis). Adopting a healthy lifestyle and getting preventive care can help to promote your health and wellness. The actions you take can also lower your chances of developing some of these common conditions. What should I know about menopause? During menopause, you may get a number of symptoms, such as:  Hot flashes. These can be moderate or severe.  Night sweats.  Decrease in sex drive.  Mood swings.  Headaches.  Tiredness.  Irritability.  Memory problems.  Insomnia. Choosing to treat or not to treat these symptoms is a decision that you make with your health care provider. Do I need hormone replacement therapy?  Hormone replacement therapy is effective in treating symptoms that are caused by menopause, such as hot flashes and night sweats.  Hormone replacement carries certain risks, especially as you become older. If you are thinking about using estrogen or estrogen with progestin, discuss the benefits and risks with your health care provider. What is my risk for heart disease and stroke? The risk of heart disease, heart attack, and stroke increases as you age. One of the causes may be a change in the body's hormones during menopause. This can affect how your body uses dietary fats, triglycerides, and cholesterol. Heart attack and stroke are  medical emergencies. There are many things that you can do to help prevent heart disease and stroke. Watch your blood pressure  High blood pressure causes heart disease and increases the risk of stroke. This is more likely to develop in people who have high blood pressure readings, are of African descent, or are overweight.  Have your blood pressure checked: ? Every 3-5 years if you are 75-32 years of age. ? Every year if you are 16 years old or older. Eat a healthy diet  Eat a diet that includes plenty of vegetables, fruits, low-fat dairy products, and lean protein.  Do not eat a lot of foods that are high in solid fats, added sugars, or sodium.   Get regular exercise Get regular exercise. This is one of the most important things you can do for your health. Most adults should:  Try to exercise for at least 150 minutes each week. The exercise should increase your heart rate and make you sweat (moderate-intensity exercise).  Try to do strengthening exercises at least twice each week. Do these in addition to the moderate-intensity exercise.  Spend less time sitting. Even light physical activity can be beneficial. Other tips  Work with your health care provider to achieve or maintain a healthy weight.  Do not use any products that contain nicotine or tobacco, such as cigarettes, e-cigarettes, and chewing tobacco. If you need help quitting, ask your health care provider.  Know your numbers. Ask your health care provider to check your cholesterol and your blood sugar (glucose). Continue to have your blood tested as directed by your health care provider. Do  I need screening for cancer? Depending on your health history and family history, you may need to have cancer screening at different stages of your life. This may include screening for:  Breast cancer.  Cervical cancer.  Lung cancer.  Colorectal cancer. What is my risk for osteoporosis? After menopause, you may be at increased  risk for osteoporosis. Osteoporosis is a condition in which bone destruction happens more quickly than new bone creation. To help prevent osteoporosis or the bone fractures that can happen because of osteoporosis, you may take the following actions:  If you are 33-49 years old, get at least 1,000 mg of calcium and at least 600 mg of vitamin D per day.  If you are older than age 67 but younger than age 6, get at least 1,200 mg of calcium and at least 600 mg of vitamin D per day.  If you are older than age 74, get at least 1,200 mg of calcium and at least 800 mg of vitamin D per day. Smoking and drinking excessive alcohol increase the risk of osteoporosis. Eat foods that are rich in calcium and vitamin D, and do weight-bearing exercises several times each week as directed by your health care provider. How does menopause affect my mental health? Depression may occur at any age, but it is more common as you become older. Common symptoms of depression include:  Low or sad mood.  Changes in sleep patterns.  Changes in appetite or eating patterns.  Feeling an overall lack of motivation or enjoyment of activities that you previously enjoyed.  Frequent crying spells. Talk with your health care provider if you think that you are experiencing depression. General instructions See your health care provider for regular wellness exams and vaccines. This may include:  Scheduling regular health, dental, and eye exams.  Getting and maintaining your vaccines. These include: ? Influenza vaccine. Get this vaccine each year before the flu season begins. ? Pneumonia vaccine. ? Shingles vaccine. ? Tetanus, diphtheria, and pertussis (Tdap) booster vaccine. Your health care provider may also recommend other immunizations. Tell your health care provider if you have ever been abused or do not feel safe at home. Summary  Menopause is a normal process in which your ability to get pregnant comes to an  end.  This condition causes hot flashes, night sweats, decreased interest in sex, mood swings, headaches, or lack of sleep.  Treatment for this condition may include hormone replacement therapy.  Take actions to keep yourself healthy, including exercising regularly, eating a healthy diet, watching your weight, and checking your blood pressure and blood sugar levels.  Get screened for cancer and depression. Make sure that you are up to date with all your vaccines. This information is not intended to replace advice given to you by your health care provider. Make sure you discuss any questions you have with your health care provider. Document Revised: 08/03/2018 Document Reviewed: 08/03/2018 Elsevier Patient Education  2021 [employer].

## 2020-10-23 NOTE — Assessment & Plan Note (Signed)
Controlled. Continue crestor 10mg 

## 2020-10-23 NOTE — Assessment & Plan Note (Signed)
CBE performed. Mammogram UTD. No longer screening for colon cancer, cervical cancer in the absence of complaints. Deferred screening labs since labs done 07/2020 and in 08/2020

## 2020-10-23 NOTE — Assessment & Plan Note (Signed)
Well controlled.continue xanax 0.25mg 

## 2020-10-25 NOTE — Telephone Encounter (Signed)
Pt scheduled for fasting labs a week prior to f/u in September.

## 2020-11-18 ENCOUNTER — Other Ambulatory Visit: Payer: Self-pay | Admitting: Cardiovascular Disease

## 2020-11-18 NOTE — Telephone Encounter (Signed)
Rx request sent to pharmacy.  

## 2020-12-10 ENCOUNTER — Ambulatory Visit (INDEPENDENT_AMBULATORY_CARE_PROVIDER_SITE_OTHER): Payer: Self-pay | Admitting: Dermatology

## 2020-12-10 ENCOUNTER — Other Ambulatory Visit: Payer: Self-pay

## 2020-12-10 DIAGNOSIS — L988 Other specified disorders of the skin and subcutaneous tissue: Secondary | ICD-10-CM

## 2020-12-10 NOTE — Progress Notes (Signed)
   Follow-Up Visit   Subjective  Melissa Turner is a 80 y.o. female who presents for the following: Facial Elastosis (Face, pt presents for Botox today).  The following portions of the chart were reviewed this encounter and updated as appropriate:   Tobacco  Allergies  Meds  Problems  Med Hx  Surg Hx  Fam Hx     Review of Systems:  No other skin or systemic complaints except as noted in HPI or Assessment and Plan.  Objective  Well appearing patient in no apparent distress; mood and affect are within normal limits.  A focused examination was performed including face. Relevant physical exam findings are noted in the Assessment and Plan.  Objective  face: Rhytides and volume loss.   Images     Assessment & Plan  Elastosis of skin face  Botox 40 units injected today to: - Frown complex 20 units - Crows feet 10 units each side total of 20 units  Botox Injection - face Location: Frown complex, bil crow's feet  Informed consent: Discussed risks (infection, pain, bleeding, bruising, swelling, allergic reaction, paralysis of nearby muscles, eyelid droop, double vision, neck weakness, difficulty breathing, headache, undesirable cosmetic result, and need for additional treatment) and benefits of the procedure, as well as the alternatives.  Informed consent was obtained.  Preparation: The area was cleansed with alcohol.  Procedure Details:  Botox was injected into the dermis with a 30-gauge needle. Pressure applied to any bleeding. Ice packs offered for swelling.  Lot Number:  [id number] Expiration:  01/2023  Total Units Injected:  40  Plan: Patient was instructed to remain upright for 4 hours. Patient was instructed to avoid massaging the face and avoid vigorous exercise for the rest of the day. Tylenol may be used for headache.  Allow 2 weeks before returning to clinic for additional dosing as needed. Patient will call for any problems.   Return for 3-93m Botox.  I,  Othelia Pulling, RMA, am acting as scribe for Sarina Ser, MD .  Documentation: I have reviewed the above documentation for accuracy and completeness, and I agree with the above.  Sarina Ser, MD

## 2020-12-10 NOTE — Patient Instructions (Signed)

## 2020-12-11 ENCOUNTER — Encounter: Payer: Self-pay | Admitting: Dermatology

## 2020-12-12 ENCOUNTER — Other Ambulatory Visit: Payer: Self-pay | Admitting: Family

## 2020-12-12 DIAGNOSIS — F419 Anxiety disorder, unspecified: Secondary | ICD-10-CM

## 2020-12-13 ENCOUNTER — Ambulatory Visit (INDEPENDENT_AMBULATORY_CARE_PROVIDER_SITE_OTHER): Payer: PPO

## 2020-12-13 VITALS — Ht 59.0 in | Wt 123.0 lb

## 2020-12-13 DIAGNOSIS — Z0000 Encounter for general adult medical examination without abnormal findings: Secondary | ICD-10-CM

## 2020-12-13 NOTE — Progress Notes (Signed)
Subjective:   Melissa Turner is a 80 y.o. female who presents for Medicare Annual (Subsequent) preventive examination.  Review of Systems    No ROS.  Medicare Wellness Virtual Visit.    Cardiac Risk Factors include: advanced age (>21men, >46 women)     Objective:    Today's Vitals   12/13/20 1022  Weight: 123 lb (55.8 kg)  Height: 4\' 11"  (1.499 m)   Body mass index is 24.84 kg/m.  Advanced Directives 12/13/2020 10/05/2018 09/30/2016 06/25/2016  Does Patient Have a Medical Advance Directive? Yes No Yes Yes  Type of Paramedic of [place];Living will - Living will [place];Living will  Does patient want to make changes to medical advance directive? No - Patient declined - - -  Copy of [place] in Chart? No - copy requested - - No - copy requested  Would patient like information on creating a medical advance directive? - No - Patient declined - -    Current Medications (verified) Outpatient Encounter Medications as of 12/13/2020  Medication Sig  . ALPRAZolam (XANAX) 0.25 MG tablet TAKE 1 TABLET(0.25 MG) BY MOUTH DAILY AS NEEDED FOR ANXIETY  . amLODipine (NORVASC) 2.5 MG tablet Take 1 tablet (2.5 mg total) by mouth daily. (Patient not taking: Reported on 12/13/2020)  . amLODipine (NORVASC) 5 MG tablet TAKE 1 TABLET(5 MG) BY MOUTH DAILY  . aspirin EC 81 MG tablet Take 81 mg by mouth daily. Swallow whole.  . Biotin 5 MG TABS Take 1 tablet by mouth daily.  . Cholecalciferol (VITAMIN D3) 1.25 MG (50000 UT) CAPS Vitamin D3  . metoprolol tartrate (LOPRESSOR) 25 MG tablet Take 25 mg by mouth as needed. As needed for palpitations.  . rosuvastatin (CRESTOR) 10 MG tablet TAKE 1 TABLET(10 MG) BY MOUTH DAILY   No facility-administered encounter medications on file as of 12/13/2020.    Allergies (verified) Other   History: Past Medical History:  Diagnosis Date  . Deviated septum    s/p rhinoplasty  . GERD (gastroesophageal  reflux disease)   . History of blood transfusion    during first child's birth  . History of chicken pox   . HTN (hypertension)   . Hypercholesteremia   . Migraines    Past Surgical History:  Procedure Laterality Date  . ABDOMINAL HYSTERECTOMY  11/2017   partial hysterectomy; due to bladder prolapse. has bilateral ovaries.   [place] Kitchen BREAST CYST ASPIRATION Left   . BUNIONECTOMY    . DILATION AND CURETTAGE OF UTERUS    . RHINOPLASTY    . THYROID SURGERY  03/2020   parathyroidectomy  . TONSILLECTOMY  1950  . VAGINAL DELIVERY     x2   Family History  Problem Relation Age of Onset  . Heart disease Mother   . Diabetes Father   . Heart failure Father   . Dementia Father   . Cancer Other        Breast, liver, lung  . Breast cancer Neg Hx    Social History   Socioeconomic History  . Marital status: Married    Spouse name: Not on file  . Number of children: 2  . Years of education: Not on file  . Highest education level: Not on file  Occupational History  . Occupation: [employer]  Tobacco Use  . Smoking status: Never Smoker  . Smokeless tobacco: Never Used  Vaping Use  . Vaping Use: Never used  Substance and Sexual  Activity  . Alcohol use: Yes    Comment: Wine 2 to 3 times a week  . Drug use: No  . Sexual activity: Not Currently  Other Topics Concern  . Not on file  Social History Narrative   Lives with husband and 2 grandchildren live with her- 24 total grandchildren. Works at [employer] in [employer], retired 2020      Regular Exercise -  Aerobic 30 min every day, walking   Daily Caffeine Use:  1 cup sw tea daily         Social Determinants of Health   Financial Resource Strain: Low Risk   . Difficulty of Paying Living Expenses: Not hard at all  Food Insecurity: No Food Insecurity  . Worried About Charity fundraiser in the Last Year: Never true  . Ran Out of Food in the Last Year: Never true  Transportation Needs: No  Transportation Needs  . Lack of Transportation (Medical): No  . Lack of Transportation (Non-Medical): No  Physical Activity: Not on file  Stress: No Stress Concern Present  . Feeling of Stress : Not at all  Social Connections: Unknown  . Frequency of Communication with Friends and Family: Not on file  . Frequency of Social Gatherings with Friends and Family: Not on file  . Attends Religious Services: Not on file  . Active Member of Clubs or Organizations: Not on file  . Attends Archivist Meetings: Not on file  . Marital Status: Married    Tobacco Counseling Counseling given: Not Answered   Clinical Intake:  Pre-visit preparation completed: Yes        Diabetes: No  How often do you need to have someone help you when you read instructions, pamphlets, or other written materials from your doctor or pharmacy?: 1 - Never   Interpreter Needed?: No      Activities of Daily Living In your present state of health, do you have any difficulty performing the following activities: 12/13/2020  Hearing? N  Vision? N  Difficulty concentrating or making decisions? N  Walking or climbing stairs? Y  Comment Paces self.  Dressing or bathing? N  Doing errands, shopping? N  Preparing Food and eating ? N  Using the Toilet? N  In the past six months, have you accidently leaked urine? N  Do you have problems with loss of bowel control? N  Managing your Medications? N  Managing your Finances? N  Housekeeping or managing your Housekeeping? N  Some recent data might be hidden    Patient Care Team: Burnard Hawthorne, FNP as PCP - General (Family Medicine)  Indicate any recent Medical Services you may have received from other than [facility] providers in the past year (date may be approximate).     Assessment:   This is a routine wellness examination for Melissa Turner.  I connected with Melissa Turner today by telephone and verified that I am speaking with the correct person using two  identifiers. Location patient: home Location provider: work Persons participating in the virtual visit: patient, Marine scientist.    I discussed the limitations, risks, security and privacy concerns of performing an evaluation and management service by telephone and the availability of in person appointments. The patient expressed understanding and verbally consented to this telephonic visit.    Interactive audio and video telecommunications were attempted between this provider and patient, however failed, due to patient having technical difficulties OR patient did not have access to video capability.  We continued  and completed visit with audio only.  Some vital signs may be absent or patient reported.   Hearing/Vision screen  Hearing Screening   125Hz  250Hz  500Hz  1000Hz  2000Hz  3000Hz  4000Hz  6000Hz  8000Hz   Right ear:           Left ear:           Comments: Patient is able to hear conversational tones without difficulty.  No issues reported.  Vision Screening Comments: Wears corrective lenses Visual acuity not assessed, virtual visit.      Dietary issues and exercise activities discussed:    Regular diet Good water intake   Goals    . Increase physical activity     Walk for exercise as tolerated    . Increase water intake      Depression Screen PHQ 2/9 Scores 12/13/2020 07/30/2020 10/23/2019 03/06/2019 10/05/2018 03/10/2017 09/30/2016  PHQ - 2 Score 0 0 0 0 0 0 0  PHQ- 9 Score - 2 0 - - - 0    Fall Risk Fall Risk  12/13/2020 07/30/2020 03/06/2019 03/10/2017 06/25/2016  Falls in the past year? 0 0 0 No No  Number falls in past yr: 0 - - - -  Injury with Fall? 0 - - - -  Follow up Falls evaluation completed Falls evaluation completed - - -    FALL RISK PREVENTION PERTAINING TO THE HOME: Handrails in use when climbing stairs? Yes Home free of loose throw rugs in walkways, pet beds, electrical cords, etc? Yes  Adequate lighting in your home to reduce risk of falls? Yes   ASSISTIVE DEVICES  UTILIZED TO PREVENT FALLS: Use of a cane, walker or w/c? No   TIMED UP AND GO: Was the test performed? No . Virtual visit.   Cognitive Function: Patient is alert and oriented x3.   MMSE - Mini Mental State Exam 06/25/2016  Orientation to time 5  Orientation to Place 5  Registration 3  Attention/ Calculation 5  Recall 3  Language- name 2 objects 2  Language- repeat 1  Language- follow 3 step command 3  Language- read & follow direction 1  Write a sentence 1  Copy design 1  Total score 30       Immunizations Immunization History  Administered Date(s) Administered  . Influenza Split 06/25/2011, 06/13/2012  . Influenza,inj,Quad PF,6+ Mos 09/05/2015, 06/03/2018  . Influenza-Unspecified 06/08/2013, 06/26/2014, 06/07/2016  . Pneumococcal Conjugate-13 12/15/2013  . Pneumococcal Polysaccharide-23 12/12/2012  . Tdap 03/11/2017   Health Maintenance There are no preventive care reminders to display for this patient. Health Maintenance  Topic Date Due  . COVID-19 Vaccine (1) 08/15/2021 (Originally 01/17/1953)  . INFLUENZA VACCINE  03/24/2021  . COLONOSCOPY (Pts 45-63yrs Insurance coverage will need to be confirmed)  10/22/2021  . TETANUS/TDAP  03/12/2027  . DEXA SCAN  Completed  . Hepatitis C Screening  Completed  . PNA vac Low Risk Adult  Completed  . HPV VACCINES  Aged Out   Colorectal cancer screening: Type of screening: Colonoscopy. Completed 10/22/16. Repeat every 5 years   Mammogram status: Completed 05/22/20. Repeat every year  Lung Cancer Screening: (Low Dose CT Chest recommended if Age 67-80 years, 30 pack-year currently smoking OR have quit w/in 15years.) does not qualify.    Dental Screening: Recommended annual dental exams for proper oral hygiene.   Community Resource Referral / Chronic Care Management: CRR required this visit?  No   CCM required this visit?  No      Plan:   Keep  all routine maintenance appointments.   Next scheduled lab 04/21/21 @  8:00  Follow up 04/29/21 @ 9:00  Cpe 10/27/20 @ 9:00  I have personally reviewed and noted the following in the patient's chart:   . Medical and social history . Use of alcohol, tobacco or illicit drugs  . Current medications and supplements . Functional ability and status . Nutritional status . Physical activity . Advanced directives . List of other physicians . Hospitalizations, surgeries, and ER visits in previous 12 months . Vitals . Screenings to include cognitive, depression, and falls . Referrals and appointments  In addition, I have reviewed and discussed with patient certain preventive protocols, quality metrics, and best practice recommendations. A written personalized care plan for preventive services as well as general preventive health recommendations were provided to patient via mychart.     Varney Biles, LPN   [id number]

## 2020-12-13 NOTE — Patient Instructions (Addendum)
Melissa Turner , Thank you for taking time to come for your Medicare Wellness Visit. I appreciate your ongoing commitment to your health goals. Please review the following plan we discussed and let me know if I can assist you in the future.   These are the goals we discussed: Goals    . Increase physical activity     Walk for exercise as tolerated    . Increase water intake       This is a list of the screening recommended for you and due dates:  Health Maintenance  Topic Date Due  . COVID-19 Vaccine (1) 08/15/2021*  . Flu Shot  03/24/2021  . Colon Cancer Screening  10/22/2021  . Tetanus Vaccine  03/12/2027  . DEXA scan (bone density measurement)  Completed  .  Hepatitis C: One time screening is recommended by Center for Disease Control  (CDC) for  adults born from [birth year] through 1963.   Completed  . Pneumonia vaccines  Completed  . HPV Vaccine  Aged Out  *Topic was postponed. The date shown is not the original due date.    Immunizations Immunization History  Administered Date(s) Administered  . Influenza Split 06/25/2011, 06/13/2012  . Influenza,inj,Quad PF,6+ Mos 09/05/2015, 06/03/2018  . Influenza-Unspecified 06/08/2013, 06/26/2014, 06/07/2016  . Pneumococcal Conjugate-13 12/15/2013  . Pneumococcal Polysaccharide-23 12/12/2012  . Tdap 03/11/2017   Keep all routine maintenance appointments.   Next scheduled lab 04/21/21 @ 8:00  Follow up 04/29/21 @ 9:00  Cpe 10/27/20 @ 9:00  Advanced directives: End of life planning; Advance aging; Advanced directives discussed.  Copy of current HCPOA/Living Will requested.    Conditions/risks identified:   Follow up in one year for your annual wellness visit.   Preventive Care 68 Years and Older, Female Preventive care refers to lifestyle choices and visits with your health care provider that can promote health and wellness. What does preventive care include?  A yearly physical exam. This is also called an annual well check.  Dental  exams once or twice a year.  Routine eye exams. Ask your health care provider how often you should have your eyes checked.  Personal lifestyle choices, including:  Daily care of your teeth and gums.  Regular physical activity.  Eating a healthy diet.  Avoiding tobacco and drug use.  Limiting alcohol use.  Practicing safe sex.  Taking low-dose aspirin every day.  Taking vitamin and mineral supplements as recommended by your health care provider. What happens during an annual well check? The services and screenings done by your health care provider during your annual well check will depend on your age, overall health, lifestyle risk factors, and family history of disease. Counseling  Your health care provider may ask you questions about your:  Alcohol use.  Tobacco use.  Drug use.  Emotional well-being.  Home and relationship well-being.  Sexual activity.  Eating habits.  History of falls.  Memory and ability to understand (cognition).  Work and work Statistician.  Reproductive health. Screening  You may have the following tests or measurements:  Height, weight, and BMI.  Blood pressure.  Lipid and cholesterol levels. These may be checked every 5 years, or more frequently if you are over 76 years old.  Skin check.  Lung cancer screening. You may have this screening every year starting at age 15 if you have a 30-pack-year history of smoking and currently smoke or have quit within the past 15 years.  Fecal occult blood test (FOBT) of the stool. You may  have this test every year starting at age 25.  Flexible sigmoidoscopy or colonoscopy. You may have a sigmoidoscopy every 5 years or a colonoscopy every 10 years starting at age 66.  Hepatitis C blood test.  Hepatitis B blood test.  Sexually transmitted disease (STD) testing.  Diabetes screening. This is done by checking your blood sugar (glucose) after you have not eaten for a while (fasting). You may  have this done every 1-3 years.  Bone density scan. This is done to screen for osteoporosis. You may have this done starting at age 67.  Mammogram. This may be done every 1-2 years. Talk to your health care provider about how often you should have regular mammograms. Talk with your health care provider about your test results, treatment options, and if necessary, the need for more tests. Vaccines  Your health care provider may recommend certain vaccines, such as:  Influenza vaccine. This is recommended every year.  Tetanus, diphtheria, and acellular pertussis (Tdap, Td) vaccine. You may need a Td booster every 10 years.  Zoster vaccine. You may need this after age 64.  Pneumococcal 13-valent conjugate (PCV13) vaccine. One dose is recommended after age 63.  Pneumococcal polysaccharide (PPSV23) vaccine. One dose is recommended after age 31. Talk to your health care provider about which screenings and vaccines you need and how often you need them. This information is not intended to replace advice given to you by your health care provider. Make sure you discuss any questions you have with your health care provider. Document Released: 09/06/2015 Document Revised: 04/29/2016 Document Reviewed: 06/11/2015 Elsevier Interactive Patient Education  2017 [employer] Prevention in the Home Falls can cause injuries. They can happen to people of all ages. There are many things you can do to make your home safe and to help prevent falls. What can I do on the outside of my home?  Regularly fix the edges of walkways and driveways and fix any cracks.  Remove anything that might make you trip as you walk through a door, such as a raised step or threshold.  Trim any bushes or trees on the path to your home.  Use bright outdoor lighting.  Clear any walking paths of anything that might make someone trip, such as rocks or tools.  Regularly check to see if handrails are loose or broken. Make  sure that both sides of any steps have handrails.  Any raised decks and porches should have guardrails on the edges.  Have any leaves, snow, or ice cleared regularly.  Use sand or salt on walking paths during winter.  Clean up any spills in your garage right away. This includes oil or grease spills. What can I do in the bathroom?  Use night lights.  Install grab bars by the toilet and in the tub and shower. Do not use towel bars as grab bars.  Use non-skid mats or decals in the tub or shower.  If you need to sit down in the shower, use a plastic, non-slip stool.  Keep the floor dry. Clean up any water that spills on the floor as soon as it happens.  Remove soap buildup in the tub or shower regularly.  Attach bath mats securely with double-sided non-slip rug tape.  Do not have throw rugs and other things on the floor that can make you trip. What can I do in the bedroom?  Use night lights.  Make sure that you have a light by your bed that is easy  to reach.  Do not use any sheets or blankets that are too big for your bed. They should not hang down onto the floor.  Have a firm chair that has side arms. You can use this for support while you get dressed.  Do not have throw rugs and other things on the floor that can make you trip. What can I do in the kitchen?  Clean up any spills right away.  Avoid walking on wet floors.  Keep items that you use a lot in easy-to-reach places.  If you need to reach something above you, use a strong step stool that has a grab bar.  Keep electrical cords out of the way.  Do not use floor polish or wax that makes floors slippery. If you must use wax, use non-skid floor wax.  Do not have throw rugs and other things on the floor that can make you trip. What can I do with my stairs?  Do not leave any items on the stairs.  Make sure that there are handrails on both sides of the stairs and use them. Fix handrails that are broken or loose.  Make sure that handrails are as long as the stairways.  Check any carpeting to make sure that it is firmly attached to the stairs. Fix any carpet that is loose or worn.  Avoid having throw rugs at the top or bottom of the stairs. If you do have throw rugs, attach them to the floor with carpet tape.  Make sure that you have a light switch at the top of the stairs and the bottom of the stairs. If you do not have them, ask someone to add them for you. What else can I do to help prevent falls?  Wear shoes that:  Do not have high heels.  Have rubber bottoms.  Are comfortable and fit you well.  Are closed at the toe. Do not wear sandals.  If you use a stepladder:  Make sure that it is fully opened. Do not climb a closed stepladder.  Make sure that both sides of the stepladder are locked into place.  Ask someone to hold it for you, if possible.  Clearly mark and make sure that you can see:  Any grab bars or handrails.  First and last steps.  Where the edge of each step is.  Use tools that help you move around (mobility aids) if they are needed. These include:  Canes.  Walkers.  Scooters.  Crutches.  Turn on the lights when you go into a dark area. Replace any light bulbs as soon as they burn out.  Set up your furniture so you have a clear path. Avoid moving your furniture around.  If any of your floors are uneven, fix them.  If there are any pets around you, be aware of where they are.  Review your medicines with your doctor. Some medicines can make you feel dizzy. This can increase your chance of falling. Ask your doctor what other things that you can do to help prevent falls. This information is not intended to replace advice given to you by your health care provider. Make sure you discuss any questions you have with your health care provider. Document Released: 06/06/2009 Document Revised: 01/16/2016 Document Reviewed: 09/14/2014 Elsevier Interactive Patient  Education  2017 [employer].

## 2021-03-19 ENCOUNTER — Ambulatory Visit: Payer: PPO | Admitting: Podiatry

## 2021-03-19 ENCOUNTER — Encounter: Payer: Self-pay | Admitting: Podiatry

## 2021-03-19 ENCOUNTER — Other Ambulatory Visit: Payer: Self-pay

## 2021-03-19 ENCOUNTER — Ambulatory Visit (INDEPENDENT_AMBULATORY_CARE_PROVIDER_SITE_OTHER): Payer: PPO

## 2021-03-19 DIAGNOSIS — M21621 Bunionette of right foot: Secondary | ICD-10-CM | POA: Diagnosis not present

## 2021-03-19 DIAGNOSIS — L600 Ingrowing nail: Secondary | ICD-10-CM | POA: Diagnosis not present

## 2021-03-19 DIAGNOSIS — M2011 Hallux valgus (acquired), right foot: Secondary | ICD-10-CM | POA: Diagnosis not present

## 2021-03-19 DIAGNOSIS — S90122A Contusion of left lesser toe(s) without damage to nail, initial encounter: Secondary | ICD-10-CM

## 2021-03-19 MED ORDER — NEOMYCIN-POLYMYXIN-HC 1 % OT SOLN: OTIC | 1 refills | Status: DC

## 2021-03-19 NOTE — Progress Notes (Signed)
Subjective:  Patient ID: Melissa Turner, female    DOB: 1941/07/16,  MRN: [id number] HPI Chief Complaint  Patient presents with   Foot Pain    1st and 5th MPJ right - bunion deformities x years, red, shoes uncomfortable   Toe Pain    3rd toe left - stumped toes x 1 week ago, still red and tender   Ingrown Toenail    1st bilateral - both borders and 2nd toe right - medial border, possible ingrowns, tender   New Patient (Initial Visit)    Est pt 2018    80 y.o. female presents with the above complaint.   ROS: Denies fever chills nausea vomiting muscle aches pains calf pain back pain chest pain shortness of breath.  Past Medical History:  Diagnosis Date   Deviated septum    s/p rhinoplasty   GERD (gastroesophageal reflux disease)    History of blood transfusion    during first child's birth   History of chicken pox    HTN (hypertension)    Hypercholesteremia    Migraines    Past Surgical History:  Procedure Laterality Date   ABDOMINAL HYSTERECTOMY  11/2017   partial hysterectomy; due to bladder prolapse. has bilateral ovaries.    BREAST CYST ASPIRATION Left    BUNIONECTOMY     DILATION AND CURETTAGE OF UTERUS     RHINOPLASTY     THYROID SURGERY  03/2020   parathyroidectomy   TONSILLECTOMY  1950   VAGINAL DELIVERY     x2    Current Outpatient Medications:    NEOMYCIN-POLYMYXIN-HYDROCORTISONE (CORTISPORIN) 1 % SOLN OTIC solution, Apply 1-2 drops to toe BID after soaking, Disp: 10 mL, Rfl: 1   ALPRAZolam (XANAX) 0.25 MG tablet, TAKE 1 TABLET(0.25 MG) BY MOUTH DAILY AS NEEDED FOR ANXIETY, Disp: 30 tablet, Rfl: 0   amLODipine (NORVASC) 2.5 MG tablet, Take 1 tablet (2.5 mg total) by mouth daily. (Patient not taking: Reported on 12/13/2020), Disp: 90 tablet, Rfl: 3   amLODipine (NORVASC) 5 MG tablet, TAKE 1 TABLET(5 MG) BY MOUTH DAILY, Disp: 90 tablet, Rfl: 3   aspirin EC 81 MG tablet, Take 81 mg by mouth daily. Swallow whole., Disp: , Rfl:    Biotin 5 MG TABS, Take 1  tablet by mouth daily., Disp: , Rfl:    Cholecalciferol (VITAMIN D3) 1.25 MG (50000 UT) CAPS, Vitamin D3, Disp: , Rfl:    metoprolol tartrate (LOPRESSOR) 25 MG tablet, Take 25 mg by mouth as needed. As needed for palpitations., Disp: , Rfl:    rosuvastatin (CRESTOR) 10 MG tablet, TAKE 1 TABLET(10 MG) BY MOUTH DAILY, Disp: 90 tablet, Rfl: 1  No Known Allergies Review of Systems Objective:  There were no vitals filed for this visit.  General: Well developed, nourished, in no acute distress, alert and oriented x3   Dermatological: Skin is warm, dry and supple bilateral. Nails x 10 are well maintained; remaining integument appears unremarkable at this time. There are no open sores, no preulcerative lesions, no rash or signs of infection present.  Vascular: Dorsalis Pedis artery and Posterior Tibial artery pedal pulses are 2/4 bilateral with immedate capillary fill time. Pedal hair growth present. No varicosities and no lower extremity edema present bilateral.   Neruologic: Grossly intact via light touch bilateral. Vibratory intact via tuning fork bilateral. Protective threshold with Semmes Wienstein monofilament intact to all pedal sites bilateral. Patellar and Achilles deep tendon reflexes 2+ bilateral. No Babinski or clonus noted bilateral.   Musculoskeletal: No gross boney  pedal deformities bilateral. No pain, crepitus, or limitation noted with foot and ankle range of motion bilateral. Muscular strength 5/5 in all groups tested bilateral.  Gait: Unassisted, Nonantalgic.    Radiographs:  Radiographs taken today demonstrate an osseously mature individual internal fixation is noted first metatarsal of the left foot and fifth met.  Consistent with a bunion repair and a tailor's bunion repair.  She also has some what appears to be some soft tissue swelling around the distal aspect of the third toe at the DIPJ though I do not see a fracture.  Right foot demonstrates osteoarthritic change first  metatarsophalangeal joint with hallux valgus.  Osteopenia is noted generalized throughout bilaterally.  Assessment & Plan:   Assessment: Ingrown toenail medial border second digit right foot tibial and fibular borders of the hallux bilateral.  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  Plan: Discussed etiology pathology conservative versus surgical therapies chemical matricectomy's were performed to the above-mentioned borders of those toes.  She tolerated procedure well without complications.  She was provided with both oral and written home instruction for the care and soaking of the toes as well as a prescription for Cortisporin Otic to be applied twice daily.  I will follow-up with her in 2 weeks for evaluation of these surgical toes.     Demara Lover T. [place], [place]

## 2021-03-19 NOTE — Patient Instructions (Signed)

## 2021-03-31 ENCOUNTER — Other Ambulatory Visit: Payer: Self-pay

## 2021-03-31 ENCOUNTER — Ambulatory Visit: Payer: PPO | Admitting: Dermatology

## 2021-03-31 DIAGNOSIS — L578 Other skin changes due to chronic exposure to nonionizing radiation: Secondary | ICD-10-CM | POA: Diagnosis not present

## 2021-03-31 DIAGNOSIS — I781 Nevus, non-neoplastic: Secondary | ICD-10-CM

## 2021-03-31 DIAGNOSIS — Z872 Personal history of diseases of the skin and subcutaneous tissue: Secondary | ICD-10-CM | POA: Diagnosis not present

## 2021-03-31 DIAGNOSIS — L820 Inflamed seborrheic keratosis: Secondary | ICD-10-CM

## 2021-03-31 NOTE — Progress Notes (Signed)
   Follow-Up Visit   Subjective  Melissa Turner is a 80 y.o. female who presents for the following: check spots (Face, neck, L elbow, L arm, L leg, R leg, L post shoulder).  The following portions of the chart were reviewed this encounter and updated as appropriate:   Tobacco  Allergies  Meds  Problems  Med Hx  Surg Hx  Fam Hx     Review of Systems:  No other skin or systemic complaints except as noted in HPI or Assessment and Plan.  Objective  Well appearing patient in no apparent distress; mood and affect are within normal limits.  A focused examination was performed including face, neck, arms, legs, back. Relevant physical exam findings are noted in the Assessment and Plan.  Left Upper Back x 3,  L arm x 6, L post thigh x 2, L pretibial x 1, R ant thigh x 6, chest x 2, L neck x 2, Total = 22 (22) Erythematous keratotic or waxy stuck-on papule or plaque.   mid tip of L nose Dilated blood vessel   Assessment & Plan  Inflamed seborrheic keratosis Left Upper Back x 3,  L arm x 6, L post thigh x 2, L pretibial x 1, R ant thigh x 6, chest x 2, L neck x 2, Total = 22  Destruction of lesion - Left Upper Back x 3,  L arm x 6, L post thigh x 2, L pretibial x 1, R ant thigh x 6, chest x 2, L neck x 2, Total = 22 Complexity: simple   Destruction method: cryotherapy   Informed consent: discussed and consent obtained   Timeout:  patient name, date of birth, surgical site, and procedure verified Lesion destroyed using liquid nitrogen: Yes   Region frozen until ice ball extended beyond lesion: Yes   Outcome: patient tolerated procedure well with no complications   Post-procedure details: wound care instructions given    Telangiectasia mid tip of L nose At / near site of bx proven AK, Benign, Observe  Actinic Damage - chronic, secondary to cumulative UV radiation exposure/sun exposure over time - diffuse scaly erythematous macules with underlying dyspigmentation - Recommend  daily broad spectrum sunscreen SPF 30+ to sun-exposed areas, reapply every 2 hours as needed.  - Recommend staying in the shade or wearing long sleeves, sun glasses (UVA+UVB protection) and wide brim hats (4-inch brim around the entire circumference of the hat). - Call for new or changing lesions.  History of PreCancerous Actinic Keratosis -left nasal tip, biopsy-proven -treated in the past - site(s) of PreCancerous Actinic Keratosis clear today. - these may recur and new lesions may form requiring treatment to prevent transformation into skin cancer - observe for new or changing spots and contact [place] for appointment if occur - photoprotection with sun protective clothing; sunglasses and broad spectrum sunscreen with SPF of at least 30 + and frequent self skin exams recommended - yearly exams by a dermatologist recommended for persons with history of PreCancerous Actinic Keratoses  Return for as scheduled .  I, Othelia Pulling, RMA, am acting as scribe for Sarina Ser, MD . Documentation: I have reviewed the above documentation for accuracy and completeness, and I agree with the above.  Sarina Ser, MD

## 2021-03-31 NOTE — Patient Instructions (Signed)

## 2021-04-01 ENCOUNTER — Encounter: Payer: Self-pay | Admitting: Dermatology

## 2021-04-02 ENCOUNTER — Ambulatory Visit: Payer: PPO | Admitting: Podiatry

## 2021-04-02 ENCOUNTER — Other Ambulatory Visit: Payer: Self-pay

## 2021-04-02 ENCOUNTER — Encounter: Payer: Self-pay | Admitting: Podiatry

## 2021-04-02 DIAGNOSIS — Z9889 Other specified postprocedural states: Secondary | ICD-10-CM

## 2021-04-02 DIAGNOSIS — L600 Ingrowing nail: Secondary | ICD-10-CM

## 2021-04-02 NOTE — Progress Notes (Signed)
She presents today for follow-up of her matricectomy's hallux bilateral secondary to the right foot.  States that they are feeling good but they do not look that great as she refers to the scab on the fibular border of the hallux right.  Objective: Vital signs are stable alert oriented x3.  Pulses are palpable.  Tendon there is no erythema cellulitis drainage or odor she does have a scab along the fibular border of the hallux right with some mild tenderness on palpation of the tibial border of the second digit right as well as the lateral border of the hallux right.  Otherwise the left foot looks great.  Assessment: Well-healing surgical toes.  Plan: Encouraged to soak every other day strong Epson salt water and continue Cortisporin otic cover during the daytime and leave open at bedtime.  Follow-up with me as needed

## 2021-04-21 ENCOUNTER — Other Ambulatory Visit (INDEPENDENT_AMBULATORY_CARE_PROVIDER_SITE_OTHER): Payer: PPO

## 2021-04-21 ENCOUNTER — Other Ambulatory Visit: Payer: Self-pay

## 2021-04-21 DIAGNOSIS — Z0000 Encounter for general adult medical examination without abnormal findings: Secondary | ICD-10-CM | POA: Diagnosis not present

## 2021-04-21 LAB — VITAMIN D 25 HYDROXY (VIT D DEFICIENCY, FRACTURES): VITD: 47.93 ng/mL (ref 30.00–100.00)

## 2021-04-21 LAB — CBC WITH DIFFERENTIAL/PLATELET
Basophils Absolute: 0 10*3/uL (ref 0.0–0.1)
Basophils Relative: 0.8 % (ref 0.0–3.0)
Eosinophils Absolute: 0.2 10*3/uL (ref 0.0–0.7)
Eosinophils Relative: 3.6 % (ref 0.0–5.0)
HCT: 42.8 % (ref 36.0–46.0)
Hemoglobin: 15 g/dL (ref 12.0–15.0)
Lymphocytes Relative: 30.8 % (ref 12.0–46.0)
Lymphs Abs: 1.7 10*3/uL (ref 0.7–4.0)
MCHC: 34.9 g/dL (ref 30.0–36.0)
MCV: 93.9 fl (ref 78.0–100.0)
Monocytes Absolute: 0.5 10*3/uL (ref 0.1–1.0)
Monocytes Relative: 9.8 % (ref 3.0–12.0)
Neutro Abs: 3 10*3/uL (ref 1.4–7.7)
Neutrophils Relative %: 55 % (ref 43.0–77.0)
Platelets: 236 10*3/uL (ref 150.0–400.0)
RBC: 4.56 Mil/uL (ref 3.87–5.11)
RDW: 12.6 % (ref 11.5–15.5)
WBC: 5.4 10*3/uL (ref 4.0–10.5)

## 2021-04-21 LAB — COMPREHENSIVE METABOLIC PANEL
ALT: 22 U/L (ref 0–35)
AST: 25 U/L (ref 0–37)
Albumin: 4.6 g/dL (ref 3.5–5.2)
Alkaline Phosphatase: 66 U/L (ref 39–117)
BUN: 11 mg/dL (ref 6–23)
CO2: 27 mEq/L (ref 19–32)
Calcium: 9.8 mg/dL (ref 8.4–10.5)
Chloride: 103 mEq/L (ref 96–112)
Creatinine, Ser: 0.78 mg/dL (ref 0.40–1.20)
GFR: 72.83 mL/min (ref >60.00)
Glucose, Bld: 102 mg/dL — ABNORMAL HIGH (ref 70–99)
Potassium: 3.9 mEq/L (ref 3.5–5.1)
Sodium: 140 mEq/L (ref 135–145)
Total Bilirubin: 0.7 mg/dL (ref 0.2–1.2)
Total Protein: 7.2 g/dL (ref 6.0–8.3)

## 2021-04-21 LAB — LIPID PANEL
Cholesterol: 166 mg/dL (ref 0–200)
HDL: 36.3 mg/dL — ABNORMAL LOW (ref >39.00)
LDL Cholesterol: 98 mg/dL (ref 0–99)
NonHDL: 130.02
Total CHOL/HDL Ratio: 5
Triglycerides: 161 mg/dL — ABNORMAL HIGH (ref 0.0–149.0)
VLDL: 32.2 mg/dL (ref 0.0–40.0)

## 2021-04-21 LAB — TSH: TSH: 4.94 u[IU]/mL (ref 0.35–5.50)

## 2021-04-21 LAB — HEMOGLOBIN A1C: Hgb A1c MFr Bld: 5.9 % (ref 4.6–6.5)

## 2021-04-25 DIAGNOSIS — H2513 Age-related nuclear cataract, bilateral: Secondary | ICD-10-CM | POA: Diagnosis not present

## 2021-04-28 ENCOUNTER — Other Ambulatory Visit: Payer: Self-pay | Admitting: Cardiovascular Disease

## 2021-04-28 ENCOUNTER — Other Ambulatory Visit: Payer: Self-pay | Admitting: Internal Medicine

## 2021-04-28 DIAGNOSIS — F419 Anxiety disorder, unspecified: Secondary | ICD-10-CM

## 2021-04-29 ENCOUNTER — Ambulatory Visit (INDEPENDENT_AMBULATORY_CARE_PROVIDER_SITE_OTHER): Payer: PPO | Admitting: Family

## 2021-04-29 ENCOUNTER — Encounter: Payer: Self-pay | Admitting: Family

## 2021-04-29 ENCOUNTER — Other Ambulatory Visit: Payer: Self-pay

## 2021-04-29 VITALS — BP 160/84 | HR 96 | Temp 98.4°F | Ht 59.0 in | Wt 127.2 lb

## 2021-04-29 DIAGNOSIS — I10 Essential (primary) hypertension: Secondary | ICD-10-CM

## 2021-04-29 DIAGNOSIS — F419 Anxiety disorder, unspecified: Secondary | ICD-10-CM | POA: Diagnosis not present

## 2021-04-29 DIAGNOSIS — Z1231 Encounter for screening mammogram for malignant neoplasm of breast: Secondary | ICD-10-CM

## 2021-04-29 DIAGNOSIS — E785 Hyperlipidemia, unspecified: Secondary | ICD-10-CM | POA: Diagnosis not present

## 2021-04-29 MED ORDER — METOPROLOL SUCCINATE ER 25 MG PO TB24: 25.0000 mg | ORAL_TABLET | Freq: Every day | ORAL | 3 refills | Status: DC

## 2021-04-29 NOTE — Progress Notes (Signed)
Subjective:    Patient ID: Melissa Turner, female    DOB: 1941/03/12, 80 y.o.   MRN: [id number]  CC: Melissa Turner is a 80 y.o. female who presents today for follow up.   HPI: Feels well today and no complaints   Hypertension-compliant with lopressor 12.'5mg'$  qd. She is not on amlodipine 7.5 mg due to leg swelling.  BP at home 131/ 78.  Denies exertional chest pain or pressure, numbness or tingling radiating to left arm or jaw, palpitations, dizziness, frequent headaches, changes in vision, or shortness of breath.   Last seen Dr Rockey Situ for CAD, HTN, PVCs 09/2020. Advised metoprolol succinate 25 mg  qd for HTN   Anxiety-compliant with Xanax 0.25 mg taken as needed. Anxiety well controlled.   Long-term aspirin use-compliant with 81 mg ASA. No bleeding.   HLD-compliant with Crestor '10mg'$    HISTORY:  Past Medical History:  Diagnosis Date   Deviated septum    s/p rhinoplasty   GERD (gastroesophageal reflux disease)    History of blood transfusion    during first child's birth   History of chicken pox    HTN (hypertension)    Hypercholesteremia    Migraines    Past Surgical History:  Procedure Laterality Date   ABDOMINAL HYSTERECTOMY  11/2017   partial hysterectomy; due to bladder prolapse. has bilateral ovaries.    BREAST CYST ASPIRATION Left    BUNIONECTOMY     DILATION AND CURETTAGE OF UTERUS     RHINOPLASTY     THYROID SURGERY  03/2020   parathyroidectomy   TONSILLECTOMY  1950   VAGINAL DELIVERY     x2   Family History  Problem Relation Age of Onset   Heart disease Mother    Diabetes Father    Heart failure Father    Dementia Father    Cancer Other        Breast, liver, lung   Breast cancer Neg Hx     Allergies: Patient has no known allergies. Current Outpatient Medications on File Prior to Visit  Medication Sig Dispense Refill   ALPRAZolam (XANAX) 0.25 MG tablet TAKE 1 TABLET(0.25 MG) BY MOUTH DAILY AS NEEDED FOR ANXIETY 30 tablet 0   aspirin EC 81 MG  tablet Take 81 mg by mouth daily. Swallow whole.     rosuvastatin (CRESTOR) 10 MG tablet TAKE 1 TABLET(10 MG) BY MOUTH DAILY 90 tablet 1   Biotin 5 MG TABS Take 1 tablet by mouth daily. (Patient not taking: Reported on 04/29/2021)     Cholecalciferol (VITAMIN D3) 1.25 MG (50000 UT) CAPS Vitamin D3 (Patient not taking: Reported on 04/29/2021)     No current facility-administered medications on file prior to visit.    Social History   Tobacco Use   Smoking status: Never   Smokeless tobacco: Never  Vaping Use   Vaping Use: Never used  Substance Use Topics   Alcohol use: Yes    Comment: Wine 2 to 3 times a week   Drug use: No    Review of Systems  Constitutional:  Negative for chills and fever.  Respiratory:  Negative for cough.   Cardiovascular:  Negative for chest pain and palpitations.  Gastrointestinal:  Negative for nausea and vomiting.     Objective:    BP (!) 160/84 (BP Location: Left Arm, Patient Position: Sitting, Cuff Size: Large)   Pulse 96   Temp 98.4 F (36.9 C) (Oral)   Ht '4\' 11"'$  (1.499 m)   Wt 127  lb 3.2 oz (57.7 kg)   SpO2 97%   BMI 25.69 kg/m  BP Readings from Last 3 Encounters:  04/29/21 (!) 160/84  10/23/20 (!) 142/80  10/15/20 (!) 146/82   Wt Readings from Last 3 Encounters:  04/29/21 127 lb 3.2 oz (57.7 kg)  12/13/20 123 lb (55.8 kg)  10/23/20 123 lb 6.4 oz (56 kg)    Physical Exam Vitals reviewed.  Constitutional:      Appearance: She is well-developed.  Eyes:     Conjunctiva/sclera: Conjunctivae normal.  Cardiovascular:     Rate and Rhythm: Normal rate and regular rhythm.     Pulses: Normal pulses.     Heart sounds: Normal heart sounds.  Pulmonary:     Effort: Pulmonary effort is normal.     Breath sounds: Normal breath sounds. No wheezing, rhonchi or rales.  Skin:    General: Skin is warm and dry.  Neurological:     Mental Status: She is alert.  Psychiatric:        Speech: Speech normal.        Behavior: Behavior normal.         Thought Content: Thought content normal.       Assessment & Plan:   Problem List Items Addressed This Visit       Cardiovascular and Mediastinum   Hypertension - Primary    Uncontrolled. For some reason, patient had been changed to metoprolol tartrate. We had a long discussion regarding the difference and I have resent metoprolol succinate 25 mg once daily. She will double check at home and disgard of all metoprolol tartrate.       Relevant Medications   metoprolol succinate (TOPROL-XL) 25 MG 24 hr tablet     Other   Anxiety    Controlled. Continue Xanax 0.25 mg taken as needed      Hyperlipidemia    Stable, controlled. Continue crestor '10mg'$ .       Relevant Medications   metoprolol succinate (TOPROL-XL) 25 MG 24 hr tablet   Screening for breast cancer   Relevant Orders   MM 3D SCREEN BREAST BILATERAL     I have discontinued Dhrithi [place]. [place]'s amLODipine, metoprolol tartrate, amLODipine, and NEOMYCIN-POLYMYXIN-HYDROCORTISONE. I am also having her start on metoprolol succinate. Additionally, I am having her maintain her Vitamin D3, aspirin EC, Biotin, rosuvastatin, and ALPRAZolam.   Meds ordered this encounter  Medications   metoprolol succinate (TOPROL-XL) 25 MG 24 hr tablet    Sig: Take 1 tablet (25 mg total) by mouth daily.    Dispense:  90 tablet    Refill:  3    Order Specific Question:   Supervising Provider    Answer:   Crecencio Mc [[id number]]    Return precautions given.   Risks, benefits, and alternatives of the medications and treatment plan prescribed today were discussed, and patient expressed understanding.   Education regarding symptom management and diagnosis given to patient on AVS.  Continue to follow with Burnard Hawthorne, FNP for routine health maintenance.   [place] and I agreed with plan.   Mable Paris, FNP

## 2021-04-29 NOTE — Telephone Encounter (Signed)
Meds last sent 12/12/20 and Patient last seen 10/23/20  Pended for your approval or denial.

## 2021-04-29 NOTE — Assessment & Plan Note (Signed)
Stable, controlled. Continue crestor '10mg'$ .

## 2021-04-29 NOTE — Assessment & Plan Note (Signed)
Uncontrolled. For some reason, patient had been changed to metoprolol tartrate. We had a long discussion regarding the difference and I have resent metoprolol succinate 25 mg once daily. She will double check at home and disgard of all metoprolol tartrate.

## 2021-04-29 NOTE — Patient Instructions (Addendum)
Start metoprolol succinate 25 mg once daily. Please double check you are NOT taking metoprolol tartrate.   It is imperative that you are seen AT least twice per year for labs and monitoring. Monitor blood pressure at home and me 5-6 reading on separate days. Goal is less than 130/80;  if persistently higher, please make sooner follow up appointment so we can recheck you blood pressure and manage/ adjust medications.  Nice to see you!

## 2021-04-29 NOTE — Assessment & Plan Note (Signed)
Controlled. Continue Xanax 0.25 mg taken as needed

## 2021-05-06 ENCOUNTER — Ambulatory Visit (INDEPENDENT_AMBULATORY_CARE_PROVIDER_SITE_OTHER): Payer: Self-pay | Admitting: Dermatology

## 2021-05-06 ENCOUNTER — Other Ambulatory Visit: Payer: Self-pay

## 2021-05-06 DIAGNOSIS — L988 Other specified disorders of the skin and subcutaneous tissue: Secondary | ICD-10-CM

## 2021-05-06 NOTE — Patient Instructions (Signed)

## 2021-05-06 NOTE — Progress Notes (Signed)
   Follow-Up Visit   Subjective  Melissa Turner is a 80 y.o. female who presents for the following: Facial Elastosis (Botox today).  The following portions of the chart were reviewed this encounter and updated as appropriate:   Tobacco  Allergies  Meds  Problems  Med Hx  Surg Hx  Fam Hx     Review of Systems:  No other skin or systemic complaints except as noted in HPI or Assessment and Plan.  Objective  Well appearing patient in no apparent distress; mood and affect are within normal limits.  A focused examination was performed including face. Relevant physical exam findings are noted in the Assessment and Plan.  Head - Anterior (Face) Rhytides and volume loss.       Assessment & Plan  Elastosis of skin Head - Anterior (Face)  Botox today - 40 units total  Frown complex 20 units Crows feet 10 units each side    Filling material injection - Head - Anterior (Face) Location: See attached image  Informed consent: Discussed risks (infection, pain, bleeding, bruising, swelling, allergic reaction, paralysis of nearby muscles, eyelid droop, double vision, neck weakness, difficulty breathing, headache, undesirable cosmetic result, and need for additional treatment) and benefits of the procedure, as well as the alternatives.  Informed consent was obtained.  Preparation: The area was cleansed with alcohol.  Procedure Details:  Botox was injected into the dermis with a 30-gauge needle. Pressure applied to any bleeding. Ice packs offered for swelling.  Lot Number:  [id number] Expiration:  02/2023  Total Units Injected:  40 units  Plan: Patient was instructed to remain upright for 4 hours. Patient was instructed to avoid massaging the face and avoid vigorous exercise for the rest of the day. Tylenol may be used for headache.  Allow 2 weeks before returning to clinic for additional dosing as needed. Patient will call for any problems.   Return for Botox in 3-4 months.  I,  Ashok Cordia, CMA, am acting as scribe for Sarina Ser, MD . Documentation: I have reviewed the above documentation for accuracy and completeness, and I agree with the above.  Sarina Ser, MD

## 2021-05-09 ENCOUNTER — Encounter: Payer: Self-pay | Admitting: Dermatology

## 2021-05-15 ENCOUNTER — Telehealth: Payer: Self-pay | Admitting: Family

## 2021-05-15 NOTE — Telephone Encounter (Signed)
Was there a question about her metoprolol NP needed she is taking succinate not tartrate.

## 2021-05-15 NOTE — Telephone Encounter (Signed)
Patient is currently taking the Metoprolol Suc. and not the Metoprolol tar. She has canceled her upcoming October appointment as there was confusion and her medication never did change.   For your information.

## 2021-05-19 NOTE — Telephone Encounter (Signed)
noted 

## 2021-06-06 ENCOUNTER — Ambulatory Visit
Admission: RE | Admit: 2021-06-06 | Discharge: 2021-06-06 | Disposition: A | Discharge status: Home / Self Care | Payer: PPO | Source: Ambulatory Visit | Attending: Family | Admitting: Family

## 2021-06-06 ENCOUNTER — Other Ambulatory Visit: Payer: Self-pay

## 2021-06-06 DIAGNOSIS — Z1231 Encounter for screening mammogram for malignant neoplasm of breast: Secondary | ICD-10-CM | POA: Diagnosis not present

## 2021-06-10 ENCOUNTER — Ambulatory Visit: Payer: PPO | Admitting: Family

## 2021-07-23 DIAGNOSIS — M810 Age-related osteoporosis without current pathological fracture: Secondary | ICD-10-CM | POA: Diagnosis not present

## 2021-07-30 DIAGNOSIS — M8580 Other specified disorders of bone density and structure, unspecified site: Secondary | ICD-10-CM | POA: Diagnosis not present

## 2021-07-30 DIAGNOSIS — E892 Postprocedural hypoparathyroidism: Secondary | ICD-10-CM | POA: Diagnosis not present

## 2021-07-31 IMAGING — MG MM DIGITAL SCREENING BILAT W/ TOMO W/ CAD
8 series · 8 of 24 positions shown · non-contrast
Comparison: Previous exam(s).

CLINICAL DATA: Screening.

EXAM:
DIGITAL SCREENING BILATERAL MAMMOGRAM WITH TOMO AND CAD

[R CC synth-2D]
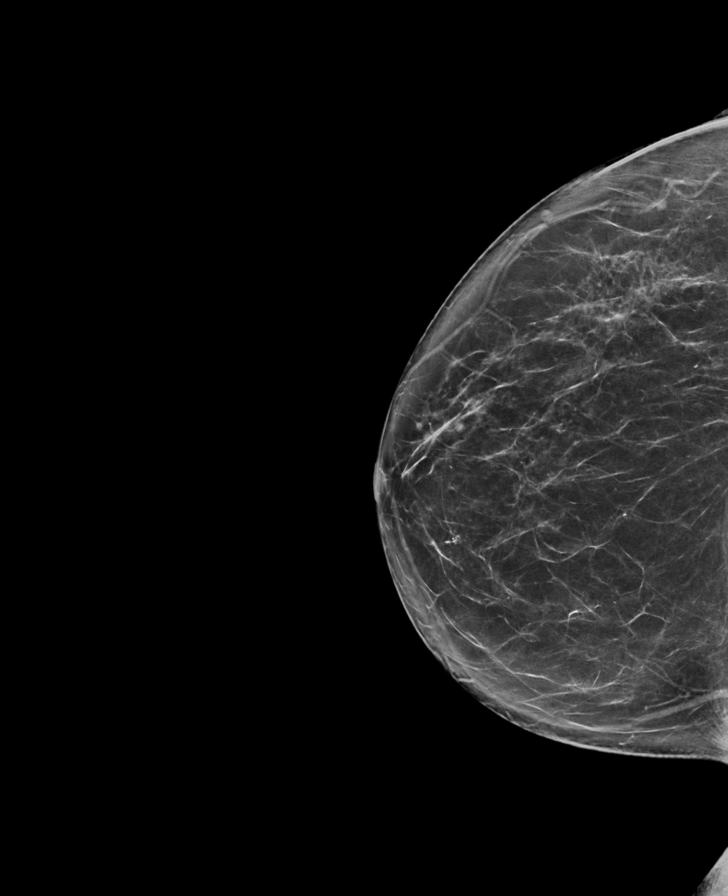

[R MLO synth-2D]
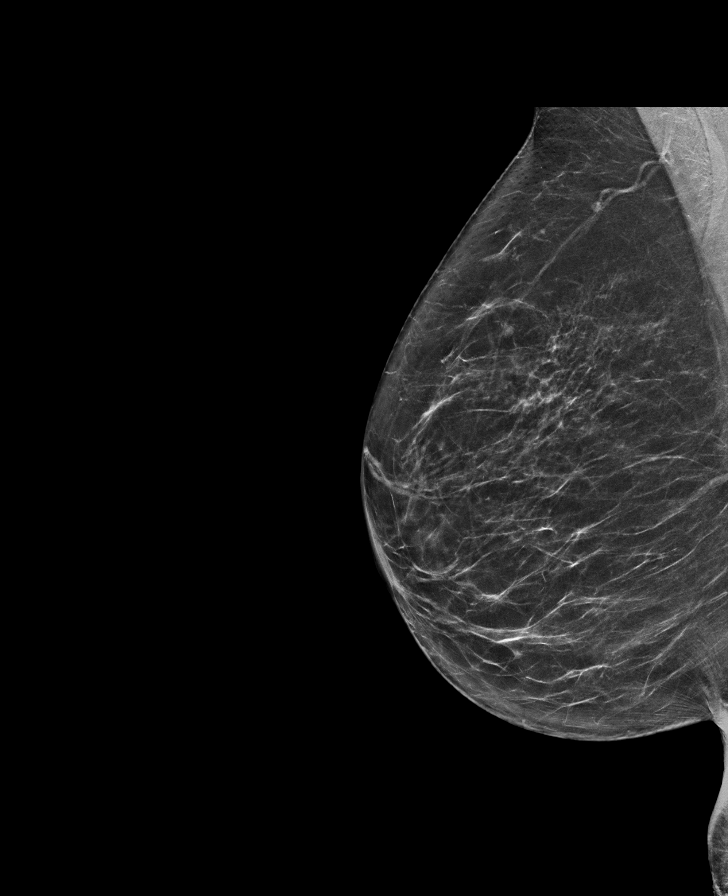

[L MLO synth-2D]
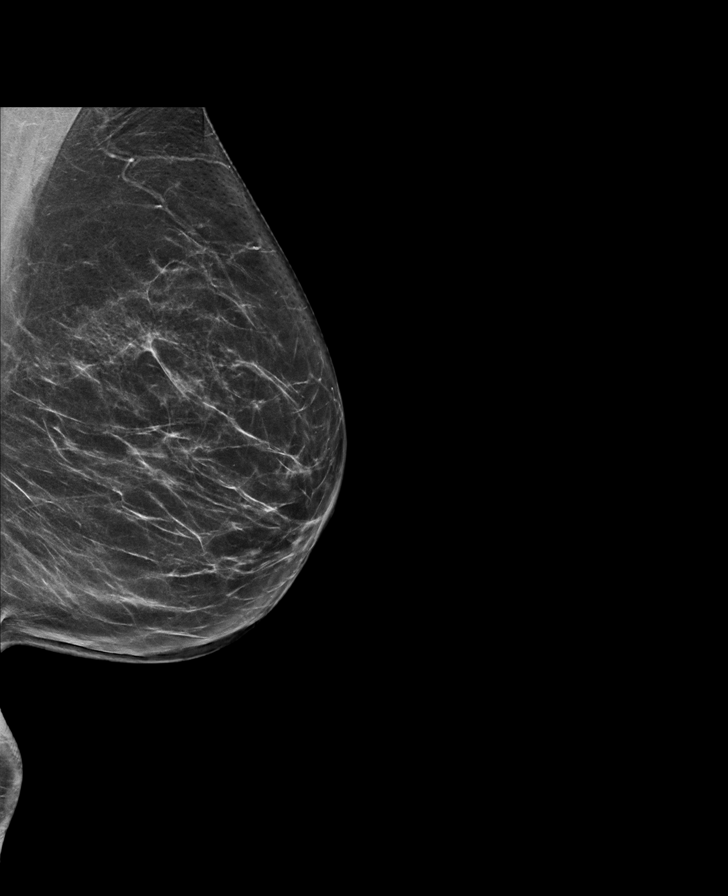

[L CC synth-2D]
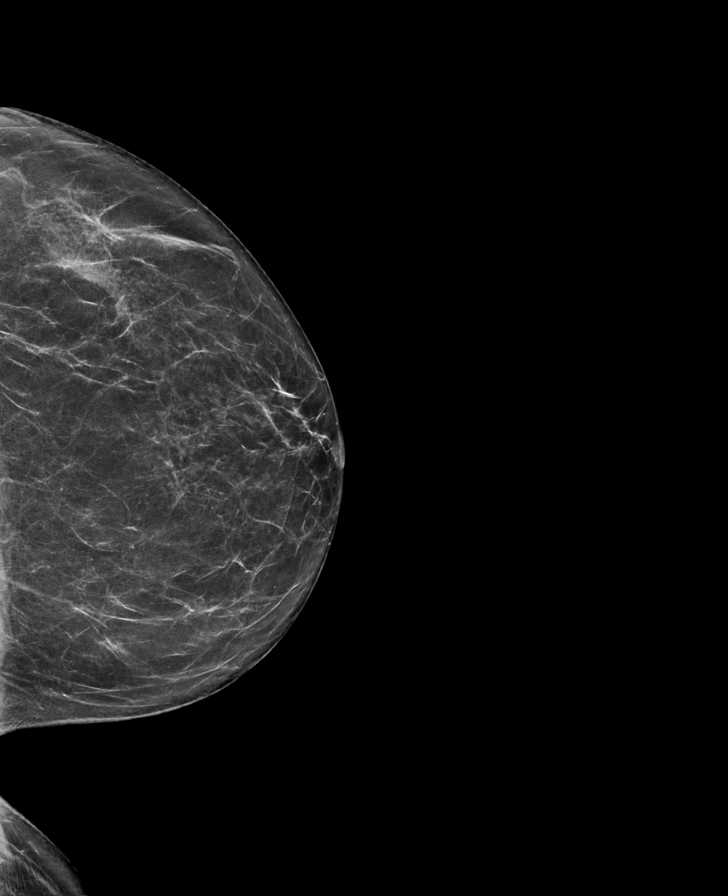

[L MLO tomo · tomo slice 37/73.0]
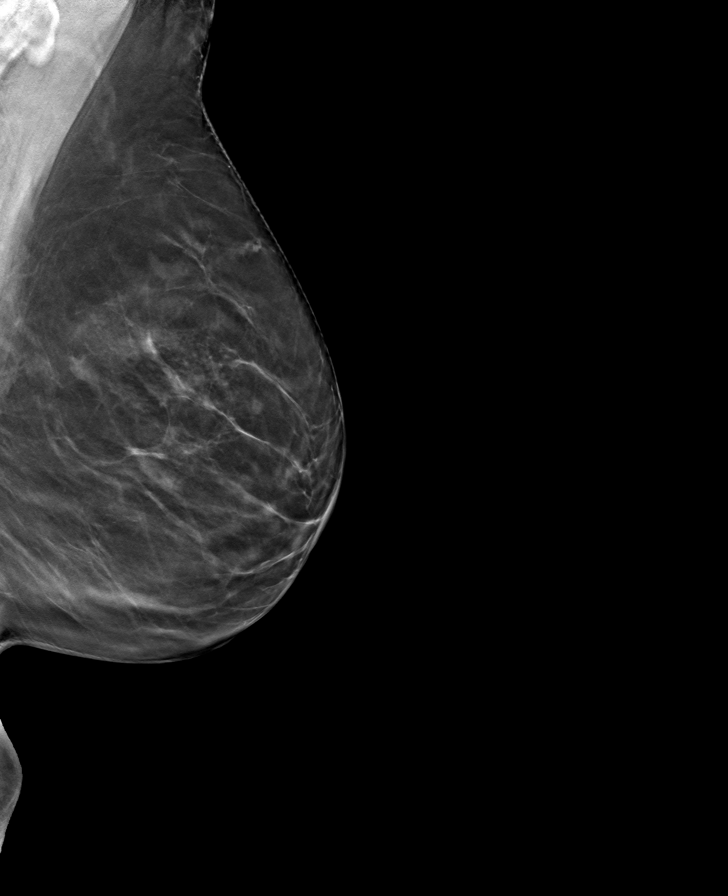

[R CC tomo · tomo slice 41/80.0]
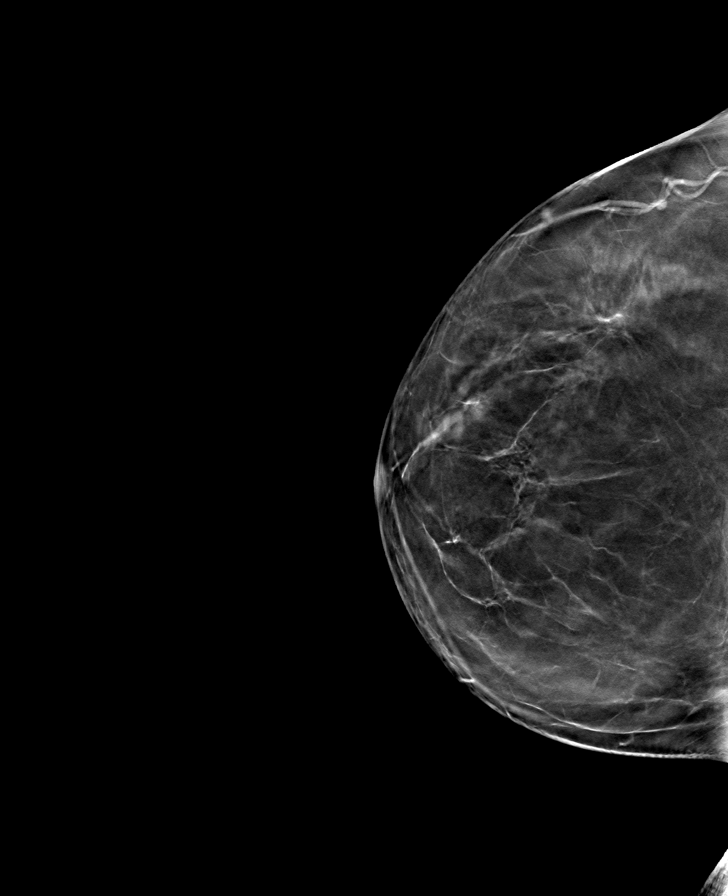

[R MLO tomo · tomo slice 37/72.0]
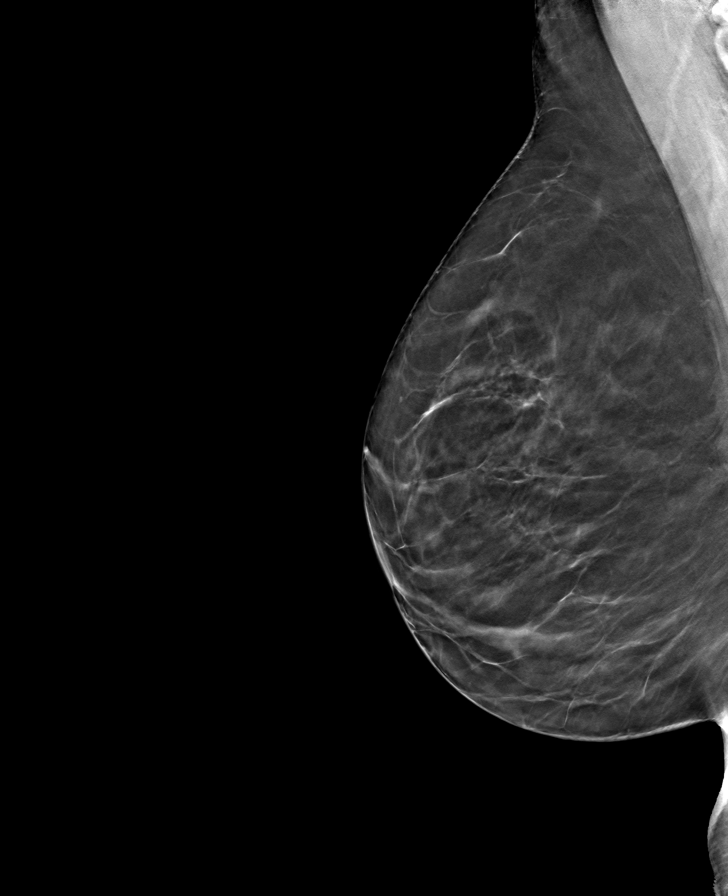

[L CC tomo · tomo slice 33/66.0]
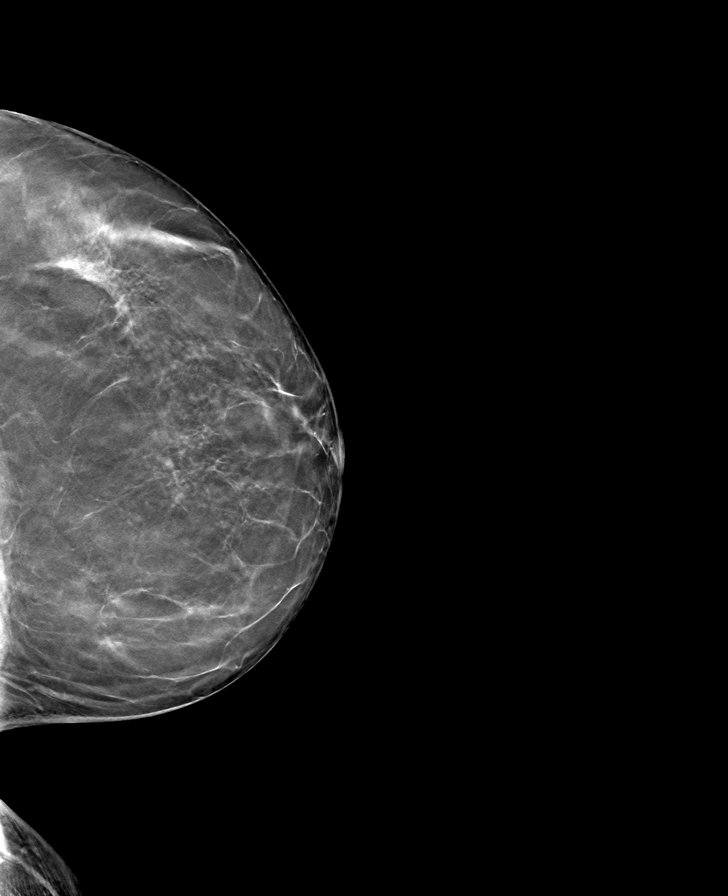

[8 of 24 positions shown; findings below may reference images not displayed]

ACR Breast Density Category b: There are scattered areas of
fibroglandular density.
FINDINGS: There are no findings suspicious for malignancy. Images were
processed with CAD.
IMPRESSION: No mammographic evidence of malignancy. A result letter of this
screening mammogram will be mailed directly to the patient.

RECOMMENDATION:
Screening mammogram in one year. (Code:[id number])

BI-RADS CATEGORY  1: Negative.

## 2021-08-09 ENCOUNTER — Other Ambulatory Visit: Payer: Self-pay | Admitting: Cardiovascular Disease

## 2021-09-16 ENCOUNTER — Ambulatory Visit (INDEPENDENT_AMBULATORY_CARE_PROVIDER_SITE_OTHER): Payer: PPO | Admitting: Dermatology

## 2021-09-16 ENCOUNTER — Other Ambulatory Visit: Payer: Self-pay

## 2021-09-16 DIAGNOSIS — D2330 Other benign neoplasm of skin of unspecified part of face: Secondary | ICD-10-CM

## 2021-09-16 DIAGNOSIS — D2339 Other benign neoplasm of skin of other parts of face: Secondary | ICD-10-CM

## 2021-09-16 DIAGNOSIS — Z872 Personal history of diseases of the skin and subcutaneous tissue: Secondary | ICD-10-CM | POA: Diagnosis not present

## 2021-09-16 DIAGNOSIS — L578 Other skin changes due to chronic exposure to nonionizing radiation: Secondary | ICD-10-CM

## 2021-09-16 DIAGNOSIS — I781 Nevus, non-neoplastic: Secondary | ICD-10-CM

## 2021-09-16 DIAGNOSIS — L570 Actinic keratosis: Secondary | ICD-10-CM | POA: Diagnosis not present

## 2021-09-16 DIAGNOSIS — L988 Other specified disorders of the skin and subcutaneous tissue: Secondary | ICD-10-CM

## 2021-09-16 NOTE — Patient Instructions (Signed)

## 2021-09-16 NOTE — Progress Notes (Signed)
Follow-Up Visit   Subjective  Melissa Turner is a 81 y.o. female who presents for the following: Facial Elastosis (Face, pt presents for Botox) and check spots (Nose, hx of AK bx proven/L cheek, months, no changes). The patient has spots, moles and lesions to be evaluated, some may be new or changing and the patient has concerns that these could be cancer.  The following portions of the chart were reviewed this encounter and updated as appropriate:   Tobacco   Allergies   Meds   Problems   Med Hx   Surg Hx   Fam Hx      Review of Systems:  No other skin or systemic complaints except as noted in HPI or Assessment and Plan.  Objective  Well appearing patient in no apparent distress; mood and affect are within normal limits.  A focused examination was performed including face. Relevant physical exam findings are noted in the Assessment and Plan.  face Rhytides and volume loss.      mid tip of L nose, at near site of bx proven AK Dilated blood vessel  L nasal tip Clear today  L nose, above the telangiectatic patch Flesh colored pap  L medial cheek x 1 Pink scaly macules  Assessment & Plan  Elastosis of skin face Botox 40 units injected today as marked: - Frown complex 20 units - Crow's feet 10 units x 2  Botox Injection - face Location: Frown complex, botox commas  Informed consent: Discussed risks (infection, pain, bleeding, bruising, swelling, allergic reaction, paralysis of nearby muscles, eyelid droop, double vision, neck weakness, difficulty breathing, headache, undesirable cosmetic result, and need for additional treatment) and benefits of the procedure, as well as the alternatives.  Informed consent was obtained.  Preparation: The area was cleansed with alcohol.  Procedure Details:  Botox was injected into the dermis with a 30-gauge needle. Pressure applied to any bleeding. Ice packs offered for swelling.  Lot Number:  [id number] Expiration:  09/2023  Total  Units Injected:  40  Plan: Patient was instructed to remain upright for 4 hours. Patient was instructed to avoid massaging the face and avoid vigorous exercise for the rest of the day. Tylenol may be used for headache.  Allow 2 weeks before returning to clinic for additional dosing as needed. Patient will call for any problems.  Telangiectasia mid tip of L nose, at near site of bx proven AK Benign, observe  History of actinic keratoses L nasal tip Bx proven and treated in past Clear today.  Fibrous papule of nose versus sebaceous hyperplasia Benign-appearing by visual exam and dermoscopy L nose, above the telangiectatic patch Benign appearing, observe for changes  AK (actinic keratosis) L medial cheek x 1 Destruction of lesion - L medial cheek x 1 Complexity: simple   Destruction method: cryotherapy   Informed consent: discussed and consent obtained   Timeout:  patient name, date of birth, surgical site, and procedure verified Lesion destroyed using liquid nitrogen: Yes   Region frozen until ice ball extended beyond lesion: Yes   Outcome: patient tolerated procedure well with no complications   Post-procedure details: wound care instructions given    Actinic Damage - chronic, secondary to cumulative UV radiation exposure/sun exposure over time - diffuse scaly erythematous macules with underlying dyspigmentation - Recommend daily broad spectrum sunscreen SPF 30+ to sun-exposed areas, reapply every 2 hours as needed.  - Recommend staying in the shade or wearing long sleeves, sun glasses (UVA+UVB protection) and wide  brim hats (4-inch brim around the entire circumference of the hat). - Call for new or changing lesions.  Return in 4 months (on 01/14/2022) for 4.8m for Botox and recheck AK L medial cheek.  I, Othelia Pulling, RMA, am acting as scribe for Sarina Ser, MD . Documentation: I have reviewed the above documentation for accuracy and completeness, and I agree with the  above.  Sarina Ser, MD

## 2021-09-20 ENCOUNTER — Encounter: Payer: Self-pay | Admitting: Dermatology

## 2021-10-09 NOTE — Progress Notes (Signed)
Cardiology Office Note  Date:  10/10/2021   ID:  Melissa Turner, DOB 1941-03-15, MRN [id number]  PCP:  Burnard Hawthorne, FNP   Chief Complaint  Patient presents with   12 month follow up     Patient c/o shortness of breath, had Covid 2 weeks ago. Medications reviewed by the patient verbally.     HPI:  Melissa Turner is a 81 year old woman with history of nonsmoker HTN Hyperlipidemia Elevated CT coronary calcium score 104 on scan October 2018 Who presents for follow-up of her coronary artery disease, hypertension  Last seen in clinic February 2022 Viral infection in early feb, Lingering SOB Started yoga Slowly getting better  No chest pain on exertion concerning for angina, No leg edema  Knee pain last summer while playing badminton in her flip-flops on the lawn May have dislocated the knee.  By extending the knee popped back into place  Parathyroid surgery Aug 2021  Sept 2021, covid Long recovery Joint pain  EKG personally reviewed by myself on todays visit NSR rate 87 bpm no ST changes  Quit metoprolol when she had covid  Lipitor myalgias On crestor, no significant side effects    Lab Results  Component Value Date   CHOL 166 04/21/2021   HDL 36.30 (L) 04/21/2021   LDLCALC 98 04/21/2021   TRIG 161.0 (H) 04/21/2021    Other past medical history reviewed Family history reviewed on today's visit Mom with CAD age 50, PCI, smoker Father possible CHF, diabetes GM with MI, 82, died of CVA, smoker  PMH:   has a past medical history of Deviated septum, GERD (gastroesophageal reflux disease), History of blood transfusion, History of chicken pox, HTN (hypertension), Hypercholesteremia, and Migraines.  PSH:    Past Surgical History:  Procedure Laterality Date   ABDOMINAL HYSTERECTOMY  11/2017   partial hysterectomy; due to bladder prolapse. has bilateral ovaries.    BREAST CYST ASPIRATION Left    BUNIONECTOMY     DILATION AND CURETTAGE OF UTERUS      RHINOPLASTY     THYROID SURGERY  03/2020   parathyroidectomy   TONSILLECTOMY  1950   VAGINAL DELIVERY     x2    Current Outpatient Medications  Medication Sig Dispense Refill   ALPRAZolam (XANAX) 0.25 MG tablet TAKE 1 TABLET(0.25 MG) BY MOUTH DAILY AS NEEDED FOR ANXIETY 30 tablet 2   aspirin EC 81 MG tablet Take 81 mg by mouth daily. Swallow whole.     chlorhexidine (PERIDEX) 0.12 % solution 15 mLs 2 (two) times daily.     metoprolol succinate (TOPROL-XL) 25 MG 24 hr tablet Take 1 tablet (25 mg total) by mouth daily. 90 tablet 3   Multiple Vitamins-Minerals (HAIR SKIN & NAILS ADVANCED) TABS daily.     rosuvastatin (CRESTOR) 10 MG tablet TAKE 1 TABLET(10 MG) BY MOUTH DAILY 90 tablet 0   Biotin 5 MG TABS Take 1 tablet by mouth daily. (Patient not taking: Reported on 04/29/2021)     Cholecalciferol (VITAMIN D3) 1.25 MG (50000 UT) CAPS Vitamin D3 (Patient not taking: Reported on 04/29/2021)     No current facility-administered medications for this visit.    Allergies:   Patient has no known allergies.   Social History:  The patient  reports that she has never smoked. She has never used smokeless tobacco. She reports current alcohol use. She reports that she does not use drugs.   Family History:   family history includes Cancer in an other family member; Dementia in  her father; Diabetes in her father; Heart disease in her mother; Heart failure in her father.    Review of Systems: Review of Systems  Constitutional: Negative.   HENT: Negative.    Respiratory: Negative.    Cardiovascular: Negative.   Gastrointestinal: Negative.   Musculoskeletal:  Positive for joint pain.  Neurological: Negative.   Psychiatric/Behavioral: Negative.    All other systems reviewed and are negative.  PHYSICAL EXAM: VS:  BP (!) 144/80 (BP Location: Left Arm, Patient Position: Sitting, Cuff Size: Normal)    Pulse 87    Ht 5' (1.524 m)    Wt 122 lb (55.3 kg)    SpO2 98%    BMI 23.83 kg/m  , BMI Body mass  index is 23.83 kg/m. Constitutional:  oriented to person, place, and time. No distress.  HENT:  Head: Grossly normal Eyes:  no discharge. No scleral icterus.  Neck: No JVD, no carotid bruits  Cardiovascular: Regular rate and rhythm, no murmurs appreciated Pulmonary/Chest: Clear to auscultation bilaterally, no wheezes or rails Abdominal: Soft.  no distension.  no tenderness.  Musculoskeletal: Normal range of motion Neurological:  normal muscle tone. Coordination normal. No atrophy Skin: Skin warm and dry Psychiatric: normal affect, pleasant   Recent Labs: 04/21/2021: ALT 22; BUN 11; Creatinine, Ser 0.78; Hemoglobin 15.0; Platelets 236.0; Potassium 3.9; Sodium 140; TSH 4.94    Lipid Panel Lab Results  Component Value Date   CHOL 166 04/21/2021   HDL 36.30 (L) 04/21/2021   LDLCALC 98 04/21/2021   TRIG 161.0 (H) 04/21/2021      Wt Readings from Last 3 Encounters:  10/10/21 122 lb (55.3 kg)  04/29/21 127 lb 3.2 oz (57.7 kg)  12/13/20 123 lb (55.8 kg)      ASSESSMENT AND PLAN:  Mixed hyperlipidemia -  Tolerating Crestor 10 mg daily Continue current medication  Essential hypertension - Monitor at home, BP mildly elevated in the office today, recommend she monitor blood pressure at home Suspect will be in an acceptable range Weight stable  APCs, PVCs Denies significant symptoms  CAD home Mild coronary calcification on CT scan, discussed Calcium score 100 On Crestor, at goal No further work-up needed Potentially could do calcium scoring every 10 years    Total encounter time more than 30 minutes  Greater than 50% was spent in counseling and coordination of care with the patient    No orders of the defined types were placed in this encounter.    Signed, Esmond Plants, M.D., Ph.D. 10/10/2021  [place], [place]

## 2021-10-10 ENCOUNTER — Encounter: Payer: Self-pay | Admitting: Cardiovascular Disease

## 2021-10-10 ENCOUNTER — Other Ambulatory Visit: Payer: Self-pay

## 2021-10-10 ENCOUNTER — Ambulatory Visit: Payer: PPO | Admitting: Cardiovascular Disease

## 2021-10-10 VITALS — BP 144/80 | HR 87 | Ht 60.0 in | Wt 122.0 lb

## 2021-10-10 DIAGNOSIS — E785 Hyperlipidemia, unspecified: Secondary | ICD-10-CM | POA: Diagnosis not present

## 2021-10-10 DIAGNOSIS — I2510 Atherosclerotic heart disease of native coronary artery without angina pectoris: Secondary | ICD-10-CM | POA: Diagnosis not present

## 2021-10-10 DIAGNOSIS — I2584 Coronary atherosclerosis due to calcified coronary lesion: Secondary | ICD-10-CM | POA: Diagnosis not present

## 2021-10-10 DIAGNOSIS — I10 Essential (primary) hypertension: Secondary | ICD-10-CM

## 2021-10-10 NOTE — Patient Instructions (Signed)
Medication Instructions:  No changes  If you need a refill on your cardiac medications before your next appointment, please call your pharmacy.   Lab work: No new labs needed  Testing/Procedures: No new testing needed  Follow-Up: At [facility], you and your health needs are our priority.  As part of our continuing mission to provide you with exceptional heart care, we have created designated Provider Care Teams.  These Care Teams include your primary Cardiologist (physician) and Advanced Practice Providers (APPs -  Physician Assistants and Nurse Practitioners) who all work together to provide you with the care you need, when you need it.  You will need a follow up appointment in 12 months  Providers on your designated Care Team:   Christopher Berge, NP Ryan Dunn, PA-C Cadence Furth, PA-C  COVID-19 Vaccine Information can be found at: [email or web address] For questions related to vaccine distribution or appointments, please email [email or web address] or call [phone].   

## 2021-10-27 ENCOUNTER — Other Ambulatory Visit: Payer: Self-pay

## 2021-10-27 ENCOUNTER — Ambulatory Visit (INDEPENDENT_AMBULATORY_CARE_PROVIDER_SITE_OTHER): Payer: PPO | Admitting: Family

## 2021-10-27 ENCOUNTER — Encounter: Payer: Self-pay | Admitting: Family

## 2021-10-27 VITALS — BP 138/68 | HR 96 | Temp 98.0°F | Ht 60.0 in | Wt 124.2 lb

## 2021-10-27 DIAGNOSIS — I10 Essential (primary) hypertension: Secondary | ICD-10-CM | POA: Diagnosis not present

## 2021-10-27 DIAGNOSIS — Z0000 Encounter for general adult medical examination without abnormal findings: Secondary | ICD-10-CM

## 2021-10-27 LAB — COMPREHENSIVE METABOLIC PANEL
ALT: 28 U/L (ref 0–35)
AST: 29 U/L (ref 0–37)
Albumin: 4.9 g/dL (ref 3.5–5.2)
Alkaline Phosphatase: 66 U/L (ref 39–117)
BUN: 15 mg/dL (ref 6–23)
CO2: 29 mEq/L (ref 19–32)
Calcium: 9.7 mg/dL (ref 8.4–10.5)
Chloride: 101 mEq/L (ref 96–112)
Creatinine, Ser: 0.74 mg/dL (ref 0.40–1.20)
GFR: 77.3 mL/min (ref >60.00)
Glucose, Bld: 101 mg/dL — ABNORMAL HIGH (ref 70–99)
Potassium: 4.1 mEq/L (ref 3.5–5.1)
Sodium: 139 mEq/L (ref 135–145)
Total Bilirubin: 0.9 mg/dL (ref 0.2–1.2)
Total Protein: 7.4 g/dL (ref 6.0–8.3)

## 2021-10-27 LAB — CBC WITH DIFFERENTIAL/PLATELET
Basophils Absolute: 0 10*3/uL (ref 0.0–0.1)
Basophils Relative: 0.9 % (ref 0.0–3.0)
Eosinophils Absolute: 0.2 10*3/uL (ref 0.0–0.7)
Eosinophils Relative: 2.9 % (ref 0.0–5.0)
HCT: 43.2 % (ref 36.0–46.0)
Hemoglobin: 15 g/dL (ref 12.0–15.0)
Lymphocytes Relative: 28.6 % (ref 12.0–46.0)
Lymphs Abs: 1.5 10*3/uL (ref 0.7–4.0)
MCHC: 34.7 g/dL (ref 30.0–36.0)
MCV: 94.2 fl (ref 78.0–100.0)
Monocytes Absolute: 0.5 10*3/uL (ref 0.1–1.0)
Monocytes Relative: 10 % (ref 3.0–12.0)
Neutro Abs: 3.1 10*3/uL (ref 1.4–7.7)
Neutrophils Relative %: 57.6 % (ref 43.0–77.0)
Platelets: 237 10*3/uL (ref 150.0–400.0)
RBC: 4.59 Mil/uL (ref 3.87–5.11)
RDW: 13.1 % (ref 11.5–15.5)
WBC: 5.4 10*3/uL (ref 4.0–10.5)

## 2021-10-27 LAB — VITAMIN D 25 HYDROXY (VIT D DEFICIENCY, FRACTURES): VITD: 37.37 ng/mL (ref 30.00–100.00)

## 2021-10-27 LAB — LIPID PANEL
Cholesterol: 184 mg/dL (ref 0–200)
HDL: 40.7 mg/dL (ref >39.00)
LDL Cholesterol: 110 mg/dL — ABNORMAL HIGH (ref 0–99)
NonHDL: 143.23
Total CHOL/HDL Ratio: 5
Triglycerides: 166 mg/dL — ABNORMAL HIGH (ref 0.0–149.0)
VLDL: 33.2 mg/dL (ref 0.0–40.0)

## 2021-10-27 LAB — TSH: TSH: 2.81 u[IU]/mL (ref 0.35–5.50)

## 2021-10-27 LAB — HEMOGLOBIN A1C: Hgb A1c MFr Bld: 5.7 % (ref 4.6–6.5)

## 2021-10-27 MED ORDER — AMLODIPINE BESYLATE 2.5 MG PO TABS: 2.5000 mg | ORAL_TABLET | Freq: Every day | ORAL | 3 refills | Status: DC

## 2021-10-27 NOTE — Assessment & Plan Note (Signed)
Clinical breast exam performed today.  Deferred pelvic exam in the absence of complaints.  We are however going to try again to get medical release /report from previous gynecologist after hysterectomy to determine if she has cervix.  Offered pelvic exam today to identify cervix.  Patient declines and would like to try to get records again.  Appointment with gastroenterology as scheduled.  She follows endocrine for bone density. ?

## 2021-10-27 NOTE — Progress Notes (Signed)
? ?Subjective:  ? ? Patient ID: Melissa Turner, female    DOB: 08-Aug-1941, 81 y.o.   MRN: [id number] ? ?CC: Melissa Turner is a 81 y.o. female who presents today for physical exam and blood pressure follow up.   ? ?HPI: Feels well today ?No new concerns ? ?Hypertension-compliant with metoprolol succinate 25 mg . Previously had been on amlodipine 2.'5mg'$ . Denies SOB, CP. Bp at home 140/80.  ? ?recently followed up with Dr. Rockey Situ, cardiology 10/10/2021 for coronary artery calcification, PVCs.  She is compliant with Crestor '10mg'$  ? ?Colorectal Cancer Screening: due, reported done in 2018.  She is seeing Dr Haig Prophet, 11/20/21 ? ?Breast Cancer Screening: Mammogram UTD ?Cervical Cancer Screening: History of hysterectomy.  She has her ovaries.  No pelvic pain ?Bone Health screening/DEXA for 65+: Following with endocrine, Dr. Gabriel Carina.  History of osteopenia with last follow-up  07/30/2021. To consider DEXA in 3 years.  ?Lung Cancer Screening: Doesn't have 20 year pack year history and age > 62 years yo 10 years ? ? ?      Tetanus - UTD ?       Pneumococcal - Completed ?Labs: Screening labs today. ?Exercise: Gets regular exercise.   ?Alcohol use:  occassional ?Smoking/tobacco use: Nonsmoker.   ? ? ?HISTORY:  ?Past Medical History:  ?Diagnosis Date  ? Deviated septum   ? s/p rhinoplasty  ? GERD (gastroesophageal reflux disease)   ? History of blood transfusion   ? during first child's birth  ? History of chicken pox   ? HTN (hypertension)   ? Hypercholesteremia   ? Migraines   ?  ?Past Surgical History:  ?Procedure Laterality Date  ? ABDOMINAL HYSTERECTOMY  11/2017  ? partial hysterectomy; due to bladder prolapse. has bilateral ovaries.   ? BREAST CYST ASPIRATION Left   ? BUNIONECTOMY    ? DILATION AND CURETTAGE OF UTERUS    ? RHINOPLASTY    ? THYROID SURGERY  03/2020  ? parathyroidectomy  ? TONSILLECTOMY  1950  ? VAGINAL DELIVERY    ? x2  ? ?Family History  ?Problem Relation Age of Onset  ? Heart disease Mother   ? Diabetes Father    ? Heart failure Father   ? Dementia Father   ? Cancer Other   ?     Breast, liver, lung  ? Breast cancer Neg Hx   ? ?  ? ?ALLERGIES: Patient has no known allergies. ? ?Current Outpatient Medications on File Prior to Visit  ?Medication Sig Dispense Refill  ? ALPRAZolam (XANAX) 0.25 MG tablet TAKE 1 TABLET(0.25 MG) BY MOUTH DAILY AS NEEDED FOR ANXIETY 30 tablet 2  ? aspirin EC 81 MG tablet Take 81 mg by mouth daily. Swallow whole.    ? metoprolol succinate (TOPROL-XL) 25 MG 24 hr tablet Take 1 tablet (25 mg total) by mouth daily. 90 tablet 3  ? Multiple Vitamins-Minerals (HAIR SKIN & NAILS ADVANCED) TABS daily.    ? rosuvastatin (CRESTOR) 10 MG tablet TAKE 1 TABLET(10 MG) BY MOUTH DAILY 90 tablet 0  ? chlorhexidine (PERIDEX) 0.12 % solution 15 mLs 2 (two) times daily. (Patient not taking: Reported on 10/27/2021)    ? ?No current facility-administered medications on file prior to visit.  ? ? ?Social History  ? ?Tobacco Use  ? Smoking status: Never  ? Smokeless tobacco: Never  ?Vaping Use  ? Vaping Use: Never used  ?Substance Use Topics  ? Alcohol use: Yes  ?  Comment: Wine 2 to 3 times  a week  ? Drug use: No  ? ? ?Review of Systems  ?Constitutional:  Negative for chills, fever and unexpected weight change.  ?HENT:  Negative for congestion.   ?Respiratory:  Negative for cough.   ?Cardiovascular:  Negative for chest pain, palpitations and leg swelling.  ?Gastrointestinal:  Negative for abdominal pain, nausea and vomiting.  ?Genitourinary:  Negative for pelvic pain.  ?Musculoskeletal:  Negative for arthralgias and myalgias.  ?Skin:  Negative for rash.  ?Neurological:  Negative for headaches.  ?Hematological:  Negative for adenopathy.  ?Psychiatric/Behavioral:  Negative for confusion.   ?   ?Objective:  ?  ?BP 138/68 (BP Location: Left Arm, Patient Position: Sitting, Cuff Size: Small)   Pulse 96   Temp 98 ?F (36.7 ?C) (Temporal)   Ht 5' (1.524 m)   Wt 124 lb 3.2 oz (56.3 kg)   SpO2 98%   BMI 24.26 kg/m?  ? ?BP  Readings from Last 3 Encounters:  ?10/27/21 138/68  ?10/10/21 (!) 144/80  ?04/29/21 (!) 160/84  ? ?Wt Readings from Last 3 Encounters:  ?10/27/21 124 lb 3.2 oz (56.3 kg)  ?10/10/21 122 lb (55.3 kg)  ?04/29/21 127 lb 3.2 oz (57.7 kg)  ? ? ?Physical Exam ?Vitals reviewed.  ?Constitutional:   ?   Appearance: Normal appearance. She is well-developed.  ?Eyes:  ?   Conjunctiva/sclera: Conjunctivae normal.  ?Neck:  ?   Thyroid: No thyroid mass or thyromegaly.  ?Cardiovascular:  ?   Rate and Rhythm: Normal rate and regular rhythm.  ?   Pulses: Normal pulses.  ?   Heart sounds: Normal heart sounds.  ?Pulmonary:  ?   Effort: Pulmonary effort is normal.  ?   Breath sounds: Normal breath sounds. No wheezing, rhonchi or rales.  ?Chest:  ?Breasts: ?   Breasts are symmetrical.  ?   Right: No inverted nipple, mass, nipple discharge, skin change or tenderness.  ?   Left: No inverted nipple, mass, nipple discharge, skin change or tenderness.  ?Abdominal:  ?   General: Bowel sounds are normal. There is no distension.  ?   Palpations: Abdomen is soft. Abdomen is not rigid. There is no fluid wave or mass.  ?   Tenderness: There is no abdominal tenderness. There is no guarding or rebound.  ?Lymphadenopathy:  ?   Head:  ?   Right side of head: No submental, submandibular, tonsillar, preauricular, posterior auricular or occipital adenopathy.  ?   Left side of head: No submental, submandibular, tonsillar, preauricular, posterior auricular or occipital adenopathy.  ?   Cervical: No cervical adenopathy.  ?   Right cervical: No superficial, deep or posterior cervical adenopathy. ?   Left cervical: No superficial, deep or posterior cervical adenopathy.  ?Skin: ?   General: Skin is warm and dry.  ?Neurological:  ?   Mental Status: She is alert.  ?Psychiatric:     ?   Speech: Speech normal.     ?   Behavior: Behavior normal.     ?   Thought Content: Thought content normal.  ? ? ?   ?Assessment & Plan:  ? ?Problem List Items Addressed This Visit    ? ?  ? Cardiovascular and Mediastinum  ? Hypertension  ?  Slightly elevated today.  Discussed goal of less than 130/80 is reasonable if tolerable to reduce risk of ASCVD.  She is in agreement with and also with resuming amlodipine 2.5 mg.  Continue metoprolol succinate 25 mg ?  ?  ?  ?  Other  ? Routine general medical examination at a health care facility - Primary  ?  Clinical breast exam performed today.  Deferred pelvic exam in the absence of complaints.  We are however going to try again to get medical release /report from previous gynecologist after hysterectomy to determine if she has cervix.  Offered pelvic exam today to identify cervix.  Patient declines and would like to try to get records again.  Appointment with gastroenterology as scheduled.  She follows endocrine for bone density. ?  ?  ? Relevant Orders  ? VITAMIN D 25 Hydroxy (Vit-D Deficiency, Fractures)  ? Hemoglobin A1c  ? TSH  ? CBC with Differential/Platelet  ? Comprehensive metabolic panel  ? Lipid panel  ? ? ? ?I am having Melissa Turner. Turner maintain her aspirin EC, ALPRAZolam, metoprolol succinate, rosuvastatin, Hair Skin & Nails Advanced, and chlorhexidine. ? ? ?No orders of the defined types were placed in this encounter. ? ? ?Return precautions given.  ? ?Risks, benefits, and alternatives of the medications and treatment plan prescribed today were discussed, and patient expressed understanding.  ? ?Education regarding symptom management and diagnosis given to patient on AVS.  ? ?Continue to follow with Burnard Hawthorne, FNP for routine health maintenance.  ? ?[place] and I agreed with plan.  ? ?Mable Paris, FNP ? ? ? ?

## 2021-10-27 NOTE — Assessment & Plan Note (Addendum)
Slightly elevated today.  Discussed goal of less than 130/80 is reasonable if tolerable to reduce risk of ASCVD.  She is in agreement with and also with resuming amlodipine 2.5 mg.  Continue metoprolol succinate 25 mg ?

## 2021-10-27 NOTE — Patient Instructions (Signed)
Restart amlodipine 2.'5mg'$  ? ?Goal of Blood pressure is 130/80. Please call the office if not arriving at this goal ? ?Nice to see you! ?Health Maintenance for Postmenopausal Women ?Menopause is a normal process in which your ability to get pregnant comes to an end. This process happens slowly over many months or years, usually between the ages of 28 and 82. Menopause is complete when you have missed your menstrual period for 12 months. ?It is important to talk with your health care provider about some of the most common conditions that affect women after menopause (postmenopausal women). These include heart disease, cancer, and bone loss (osteoporosis). Adopting a healthy lifestyle and getting preventive care can help to promote your health and wellness. The actions you take can also lower your chances of developing some of these common conditions. ?What are the signs and symptoms of menopause? ?During menopause, you may have the following symptoms: ?Hot flashes. These can be moderate or severe. ?Night sweats. ?Decrease in sex drive. ?Mood swings. ?Headaches. ?Tiredness (fatigue). ?Irritability. ?Memory problems. ?Problems falling asleep or staying asleep. ?Talk with your health care provider about treatment options for your symptoms. ?Do I need hormone replacement therapy? ?Hormone replacement therapy is effective in treating symptoms that are caused by menopause, such as hot flashes and night sweats. ?Hormone replacement carries certain risks, especially as you become older. If you are thinking about using estrogen or estrogen with progestin, discuss the benefits and risks with your health care provider. ?How can I reduce my risk for heart disease and stroke? ?The risk of heart disease, heart attack, and stroke increases as you age. One of the causes may be a change in the body's hormones during menopause. This can affect how your body uses dietary fats, triglycerides, and cholesterol. Heart attack and stroke are  medical emergencies. There are many things that you can do to help prevent heart disease and stroke. ?Watch your blood pressure ?High blood pressure causes heart disease and increases the risk of stroke. This is more likely to develop in people who have high blood pressure readings or are overweight. ?Have your blood pressure checked: ?Every 3-5 years if you are 21-81 years of age. ?Every year if you are 85 years old or older. ?Eat a healthy diet ? ?Eat a diet that includes plenty of vegetables, fruits, low-fat dairy products, and lean protein. ?Do not eat a lot of foods that are high in solid fats, added sugars, or sodium. ?Get regular exercise ?Get regular exercise. This is one of the most important things you can do for your health. Most adults should: ?Try to exercise for at least 150 minutes each week. The exercise should increase your heart rate and make you sweat (moderate-intensity exercise). ?Try to do strengthening exercises at least twice each week. Do these in addition to the moderate-intensity exercise. ?Spend less time sitting. Even light physical activity can be beneficial. ?Other tips ?Work with your health care provider to achieve or maintain a healthy weight. ?Do not use any products that contain nicotine or tobacco. These products include cigarettes, chewing tobacco, and vaping devices, such as e-cigarettes. If you need help quitting, ask your health care provider. ?Know your numbers. Ask your health care provider to check your cholesterol and your blood sugar (glucose). Continue to have your blood tested as directed by your health care provider. ?Do I need screening for cancer? ?Depending on your health history and family history, you may need to have cancer screenings at different stages of your  life. This may include screening for: ?Breast cancer. ?Cervical cancer. ?Lung cancer. ?Colorectal cancer. ?What is my risk for osteoporosis? ?After menopause, you may be at increased risk for  osteoporosis. Osteoporosis is a condition in which bone destruction happens more quickly than new bone creation. To help prevent osteoporosis or the bone fractures that can happen because of osteoporosis, you may take the following actions: ?If you are 82-75 years old, get at least 1,000 mg of calcium and at least 600 international units (IU) of vitamin D per day. ?If you are older than age 39 but younger than age 18, get at least 1,200 mg of calcium and at least 600 international units (IU) of vitamin D per day. ?If you are older than age 63, get at least 1,200 mg of calcium and at least 800 international units (IU) of vitamin D per day. ?Smoking and drinking excessive alcohol increase the risk of osteoporosis. Eat foods that are rich in calcium and vitamin D, and do weight-bearing exercises several times each week as directed by your health care provider. ?How does menopause affect my mental health? ?Depression may occur at any age, but it is more common as you become older. Common symptoms of depression include: ?Feeling depressed. ?Changes in sleep patterns. ?Changes in appetite or eating patterns. ?Feeling an overall lack of motivation or enjoyment of activities that you previously enjoyed. ?Frequent crying spells. ?Talk with your health care provider if you think that you are experiencing any of these symptoms. ?General instructions ?See your health care provider for regular wellness exams and vaccines. This may include: ?Scheduling regular health, dental, and eye exams. ?Getting and maintaining your vaccines. These include: ?Influenza vaccine. Get this vaccine each year before the flu season begins. ?Pneumonia vaccine. ?Shingles vaccine. ?Tetanus, diphtheria, and pertussis (Tdap) booster vaccine. ?Your health care provider may also recommend other immunizations. ?Tell your health care provider if you have ever been abused or do not feel safe at home. ?Summary ?Menopause is a normal process in which your  ability to get pregnant comes to an end. ?This condition causes hot flashes, night sweats, decreased interest in sex, mood swings, headaches, or lack of sleep. ?Treatment for this condition may include hormone replacement therapy. ?Take actions to keep yourself healthy, including exercising regularly, eating a healthy diet, watching your weight, and checking your blood pressure and blood sugar levels. ?Get screened for cancer and depression. Make sure that you are up to date with all your vaccines. ?This information is not intended to replace advice given to you by your health care provider. Make sure you discuss any questions you have with your health care provider. ?Document Revised: 12/30/2020 Document Reviewed: 12/30/2020 ?Elsevier Patient Education ? [place]. ? ?

## 2021-10-30 ENCOUNTER — Other Ambulatory Visit: Payer: Self-pay

## 2021-10-30 DIAGNOSIS — E785 Hyperlipidemia, unspecified: Secondary | ICD-10-CM

## 2021-10-30 MED ORDER — ROSUVASTATIN CALCIUM 20 MG PO TABS: 20.0000 mg | ORAL_TABLET | Freq: Every day | ORAL | 3 refills | Status: DC

## 2021-11-20 DIAGNOSIS — K219 Gastro-esophageal reflux disease without esophagitis: Secondary | ICD-10-CM | POA: Diagnosis not present

## 2021-11-20 DIAGNOSIS — Z86010 Personal history of colonic polyps: Secondary | ICD-10-CM | POA: Diagnosis not present

## 2021-12-03 ENCOUNTER — Encounter: Payer: Self-pay | Admitting: Family

## 2021-12-08 ENCOUNTER — Telehealth: Payer: Self-pay

## 2021-12-08 NOTE — Telephone Encounter (Signed)
Lm to call patient back when she gets back home in about 30 mins ?

## 2021-12-09 ENCOUNTER — Other Ambulatory Visit: Payer: Self-pay

## 2021-12-09 ENCOUNTER — Telehealth: Payer: Self-pay

## 2021-12-09 DIAGNOSIS — E785 Hyperlipidemia, unspecified: Secondary | ICD-10-CM

## 2021-12-09 MED ORDER — EZETIMIBE 10 MG PO TABS: 10.0000 mg | ORAL_TABLET | Freq: Every day | ORAL | 3 refills | Status: DC

## 2021-12-09 NOTE — Telephone Encounter (Signed)
Pt returning call... Pt requesting a callback... Pt would like for you to call her at [phone] ? ?

## 2021-12-09 NOTE — Telephone Encounter (Signed)
[place] office to go over result notes ?

## 2021-12-09 NOTE — Telephone Encounter (Signed)
Spoke to patient and she verbalized understanding and  was ok with starting the Zetia '10mg'$ . Sent prescription to pharmacy and canceled lab appointment for 4/24 and scheduled new appointment for 02/02/2022  ?

## 2021-12-09 NOTE — Telephone Encounter (Signed)
[place] office to go over result notes ?

## 2021-12-15 ENCOUNTER — Ambulatory Visit (INDEPENDENT_AMBULATORY_CARE_PROVIDER_SITE_OTHER): Payer: PPO

## 2021-12-15 ENCOUNTER — Other Ambulatory Visit: Payer: PPO

## 2021-12-15 VITALS — Ht 60.0 in | Wt 124.0 lb

## 2021-12-15 DIAGNOSIS — Z0000 Encounter for general adult medical examination without abnormal findings: Secondary | ICD-10-CM | POA: Diagnosis not present

## 2021-12-15 NOTE — Patient Instructions (Addendum)
?  Ms. Clopper , ?Thank you for taking time to come for your Medicare Wellness Visit. I appreciate your ongoing commitment to your health goals. Please review the following plan we discussed and let me know if I can assist you in the future.  ? ?These are the goals we discussed: ? Goals   ? ?  Increase water intake   ?  Maintain Healthy Lifestyle   ?  Low cholesterol diet ?Stay active ?Stay hydrated ?  ? ?  ?  ?This is a list of the screening recommended for you and due dates:  ?Health Maintenance  ?Topic Date Due  ? Colon Cancer Screening  10/22/2021  ? COVID-19 Vaccine (1) 11/11/2022*  ? Zoster (Shingles) Vaccine (1 of 2) 02/02/2023*  ? Flu Shot  03/24/2022  ? Tetanus Vaccine  03/12/2027  ? Pneumonia Vaccine  Completed  ? DEXA scan (bone density measurement)  Completed  ? Hepatitis C Screening: USPSTF Recommendation to screen - Ages 75-79 yo.  Completed  ? HPV Vaccine  Aged Out  ?*Topic was postponed. The date shown is not the original due date.  ?  ?

## 2021-12-15 NOTE — Progress Notes (Signed)
Subjective:   Melissa Turner is a 81 y.o. female who presents for Medicare Annual (Subsequent) preventive examination.  Review of Systems    No ROS.  Medicare Wellness Virtual Visit.  Visual/audio telehealth visit, UTA vital signs.   See social history for additional risk factors.   Cardiac Risk Factors include: advanced age (>84men, >61 women)     Objective:    Today's Vitals   12/15/21 1033  Weight: 124 lb (56.2 kg)  Height: 5' (1.524 m)   Body mass index is 24.22 kg/m.     12/15/2021   10:37 AM 12/13/2020   10:34 AM 10/05/2018    5:50 PM 09/30/2016    2:01 PM 06/25/2016    9:25 AM  Advanced Directives  Does Patient Have a Medical Advance Directive? Yes Yes No Yes Yes  Type of Estate agent of [place];Living will Healthcare Power of [place];Living will  Living will Healthcare Power of [place];Living will  Does patient want to make changes to medical advance directive? No - Patient declined No - Patient declined     Copy of Healthcare Power of Attorney in Chart? No - copy requested No - copy requested   No - copy requested  Would patient like information on creating a medical advance directive?   No - Patient declined     Current Medications (verified) Outpatient Encounter Medications as of 12/15/2021  Medication Sig   ALPRAZolam (XANAX) 0.25 MG tablet TAKE 1 TABLET(0.25 MG) BY MOUTH DAILY AS NEEDED FOR ANXIETY   amLODipine (NORVASC) 2.5 MG tablet Take 1 tablet (2.5 mg total) by mouth daily.   aspirin EC 81 MG tablet Take 81 mg by mouth daily. Swallow whole.   chlorhexidine (PERIDEX) 0.12 % solution 15 mLs 2 (two) times daily. (Patient not taking: Reported on 10/27/2021)   ezetimibe (ZETIA) 10 MG tablet Take 1 tablet (10 mg total) by mouth daily.   metoprolol succinate (TOPROL-XL) 25 MG 24 hr tablet Take 1 tablet (25 mg total) by mouth daily.   Multiple Vitamins-Minerals (HAIR SKIN & NAILS ADVANCED) TABS daily.   rosuvastatin (CRESTOR) 20 MG tablet  Take 1 tablet (20 mg total) by mouth daily.   No facility-administered encounter medications on file as of 12/15/2021.   Allergies (verified) Patient has no known allergies.   History: Past Medical History:  Diagnosis Date   Deviated septum    s/p rhinoplasty   GERD (gastroesophageal reflux disease)    History of blood transfusion    during first child's birth   History of chicken pox    HTN (hypertension)    Hypercholesteremia    Migraines    Past Surgical History:  Procedure Laterality Date   ABDOMINAL HYSTERECTOMY  11/2017   partial hysterectomy; due to bladder prolapse. has bilateral ovaries.    BREAST CYST ASPIRATION Left    BUNIONECTOMY     DILATION AND CURETTAGE OF UTERUS     RHINOPLASTY     THYROID SURGERY  03/2020   parathyroidectomy   TONSILLECTOMY  1950   VAGINAL DELIVERY     x2   Family History  Problem Relation Age of Onset   Heart disease Mother    Diabetes Father    Heart failure Father    Dementia Father    Cancer Other        Breast, liver, lung   Breast cancer Neg Hx    Social History   Socioeconomic History   Marital status: Married    Spouse name: Not  on file   Number of children: 2   Years of education: Not on file   Highest education level: Not on file  Occupational History   Occupation: Medical Records /[employer]  Tobacco Use   Smoking status: Never   Smokeless tobacco: Never  Vaping Use   Vaping Use: Never used  Substance and Sexual Activity   Alcohol use: Yes    Comment: Wine 2 to 3 times a week   Drug use: No   Sexual activity: Not Currently  Other Topics Concern   Not on file  Social History Narrative   Lives with husband and 2 grandchildren live with her- 5 total grandchildren. Works at [employer] in [employer], retired 2020      Regular Exercise -  Aerobic 30 min every day, walking   Daily Caffeine Use:  1 cup sw tea daily         Social Determinants of Health   Financial Resource Strain:  Low Risk    Difficulty of Paying Living Expenses: Not hard at all  Food Insecurity: No Food Insecurity   Worried About Programme researcher, broadcasting/film/video in the Last Year: Never true   Barista in the Last Year: Never true  Transportation Needs: No Transportation Needs   Lack of Transportation (Medical): No   Lack of Transportation (Non-Medical): No  Physical Activity: Not on file  Stress: No Stress Concern Present   Feeling of Stress : Not at all  Social Connections: Unknown   Frequency of Communication with Friends and Family: Not on file   Frequency of Social Gatherings with Friends and Family: Not on file   Attends Religious Services: Not on file   Active Member of Clubs or Organizations: Not on file   Attends Banker Meetings: Not on file   Marital Status: Married   Tobacco Counseling Counseling given: Not Answered  Clinical Intake: Pre-visit preparation completed: Yes        Diabetes: No  How often do you need to have someone help you when you read instructions, pamphlets, or other written materials from your doctor or pharmacy?: 1 - Never   Interpreter Needed?: No    Activities of Daily Living    12/15/2021   10:41 AM  In your present state of health, do you have any difficulty performing the following activities:  Hearing? 0  Vision? 0  Difficulty concentrating or making decisions? 0  Walking or climbing stairs? 0  Comment Chronic knee pain. Paces self when walking.  Dressing or bathing? 0  Doing errands, shopping? 0  Preparing Food and eating ? N  Using the Toilet? N  In the past six months, have you accidently leaked urine? N  Do you have problems with loss of bowel control? N  Managing your Medications? N  Managing your Finances? N  Housekeeping or managing your Housekeeping? N   Patient Care Team: Allegra Grana, FNP as PCP - General (Family Medicine)  Indicate any recent Medical Services you may have received from other than [facility]  providers in the past year (date may be approximate).     Assessment:   This is a routine wellness examination for Melissa Turner.  Virtual Visit via Telephone Note  I connected with  Melissa Turner on 12/15/21 at 10:30 AM EDT by telephone and verified that I am speaking with the correct person using two identifiers.  Persons participating in the virtual visit: patient/Nurse Health Advisor   I  discussed the limitations of performing an evaluation and management service by telehealth. The patient expressed understanding and agreed to proceed. We continued and completed visit with audio only. Some vital signs may be absent or patient reported.   Hearing/Vision screen Hearing Screening - Comments:: Patient is able to hear conversational tones without difficulty.  No issues reported.  Vision Screening - Comments:: Followed by [facility] Wears glasses   Dietary issues and exercise activities discussed: Current Exercise Habits: Home exercise routine, Type of exercise: yoga, Intensity: Mild Healthy diet Good water intake   Goals Addressed             This Visit's Progress    Maintain Healthy Lifestyle       Low cholesterol diet Stay active Stay hydrated       Depression Screen    12/15/2021   10:39 AM 10/27/2021    9:05 AM 12/13/2020   10:27 AM 07/30/2020   10:16 AM 10/23/2019   11:17 AM 03/06/2019    8:56 AM 10/05/2018    5:49 PM  PHQ 2/9 Scores  PHQ - 2 Score 0 0 0 0 0 0 0  PHQ- 9 Score 0 0  2 0      Fall Risk    12/15/2021   10:38 AM 10/27/2021    9:04 AM 12/13/2020   10:44 AM 07/30/2020    8:51 AM 03/06/2019    8:56 AM  Fall Risk   Falls in the past year? 0 0 0 0 0  Number falls in past yr: 0 0 0    Injury with Fall?  0 0    Risk for fall due to :  No Fall Risks     Follow up Falls evaluation completed Falls evaluation completed Falls evaluation completed Falls evaluation completed     FALL RISK PREVENTION PERTAINING TO THE HOME: Home free of loose throw rugs in  walkways, pet beds, electrical cords, etc? Yes  Adequate lighting in your home to reduce risk of falls? Yes   ASSISTIVE DEVICES UTILIZED TO PREVENT FALLS: Life alert? No  Use of a cane, walker or w/c? No   TIMED UP AND GO: Was the test performed? No .   Cognitive Function: Patient is alert and oriented x3.  Enjoys brain health stimulating activities like reading and puzzles.     06/25/2016    9:34 AM  MMSE - Mini Mental State Exam  Orientation to time 5  Orientation to Place 5  Registration 3  Attention/ Calculation 5  Recall 3  Language- name 2 objects 2  Language- repeat 1  Language- follow 3 step command 3  Language- read & follow direction 1  Write a sentence 1  Copy design 1  Total score 30       Immunizations Immunization History  Administered Date(s) Administered   Influenza Split 06/25/2011, 06/13/2012   Influenza,inj,Quad PF,6+ Mos 09/05/2015, 06/03/2018   Influenza-Unspecified 06/08/2013, 06/26/2014, 06/07/2016   Pneumococcal Conjugate-13 12/15/2013   Pneumococcal Polysaccharide-23 12/12/2012   Tdap 03/11/2017   Screening Tests Health Maintenance  Topic Date Due   COLONOSCOPY (Pts 45-24yrs Insurance coverage will need to be confirmed)  10/22/2021   COVID-19 Vaccine (1) 11/11/2022 (Originally 07/20/1941)   Zoster Vaccines- Shingrix (1 of 2) 02/02/2023 (Originally 01/16/1960)   INFLUENZA VACCINE  03/24/2022   TETANUS/TDAP  03/12/2027   Pneumonia Vaccine 74+ Years old  Completed   DEXA SCAN  Completed   Hepatitis C Screening  Completed   HPV VACCINES  Aged Out   Health Maintenance Health Maintenance Due  Topic Date Due   COLONOSCOPY (Pts 45-62yrs Insurance coverage will need to be confirmed)  10/22/2021   Colonoscopy- scheduled 01/2022.   Lung Cancer Screening: (Low Dose CT Chest recommended if Age 50-80 years, 30 pack-year currently smoking OR have quit w/in 15years.) does not qualify.   Vision Screening: Recommended annual ophthalmology exams for  early detection of glaucoma and other disorders of the eye.  Dental Screening: Recommended annual dental exams for proper oral hygiene  Community Resource Referral / Chronic Care Management: CRR required this visit?  No   CCM required this visit?  No      Plan:   Keep all routine maintenance appointments.   I have personally reviewed and noted the following in the patient's chart:   Medical and social history Use of alcohol, tobacco or illicit drugs  Current medications and supplements including opioid prescriptions.  Functional ability and status Nutritional status Physical activity Advanced directives List of other physicians Hospitalizations, surgeries, and ER visits in previous 12 months Vitals Screenings to include cognitive, depression, and falls Referrals and appointments  In addition, I have reviewed and discussed with patient certain preventive protocols, quality metrics, and best practice recommendations. A written personalized care plan for preventive services as well as general preventive health recommendations were provided to patient.     Ashok Pall, LPN   4/78/2956

## 2022-01-12 ENCOUNTER — Other Ambulatory Visit: Payer: Self-pay | Admitting: Family

## 2022-01-12 DIAGNOSIS — F419 Anxiety disorder, unspecified: Secondary | ICD-10-CM

## 2022-01-12 NOTE — Telephone Encounter (Signed)
Refilled: 04/29/2021 Last OV: 10/27/2021 Next OV: 02/02/2022

## 2022-01-27 ENCOUNTER — Telehealth: Payer: Self-pay | Admitting: Family

## 2022-01-27 NOTE — Telephone Encounter (Signed)
Pt want to know if she need to keep the office visit  on Monday or just schedule a lab because of her medication change

## 2022-01-27 NOTE — Telephone Encounter (Signed)
LM stating that Melissa Turner would want her to keep appointment as she had resumed amlodipine after her physical.  I asked that she call back if any questions.

## 2022-02-02 ENCOUNTER — Encounter: Payer: Self-pay | Admitting: Family

## 2022-02-02 ENCOUNTER — Ambulatory Visit (INDEPENDENT_AMBULATORY_CARE_PROVIDER_SITE_OTHER): Payer: PPO | Admitting: Family

## 2022-02-02 VITALS — BP 132/82 | HR 80 | Temp 98.4°F | Ht 60.0 in | Wt 124.8 lb

## 2022-02-02 DIAGNOSIS — E785 Hyperlipidemia, unspecified: Secondary | ICD-10-CM | POA: Diagnosis not present

## 2022-02-02 DIAGNOSIS — I10 Essential (primary) hypertension: Secondary | ICD-10-CM | POA: Diagnosis not present

## 2022-02-02 MED ORDER — ROSUVASTATIN CALCIUM 10 MG PO TABS: 10.0000 mg | ORAL_TABLET | ORAL | 1 refills | Status: DC

## 2022-02-02 MED ORDER — METOPROLOL SUCCINATE ER 25 MG PO TB24: 37.5000 mg | ORAL_TABLET | Freq: Every day | ORAL | 3 refills | Status: DC

## 2022-02-02 NOTE — Assessment & Plan Note (Signed)
Chronic, well controlled.  Continue toprol xl 37.'5mg'$ 

## 2022-02-02 NOTE — Progress Notes (Signed)
Subjective:    Patient ID: Melissa Turner, female    DOB: 1940/12/02, 81 y.o.   MRN: [id number]  CC: Melissa Turner is a 81 y.o. female who presents today for follow up.   HPI: Feels well today. No new complaints .BP at home 135/81, HR 69  No cp, palpitations, sob     She is no longer on amlodipine 2.'5mg'$  Compliant with toprol xl 37.'5mg'$  Compliant with zetia '10mg'$ . She is not taking crestor '20mg'$  due to arthralgia.  Arthralgia resolved when she stopped taking Crestor 20 mg    HISTORY:  Past Medical History:  Diagnosis Date   Deviated septum    s/p rhinoplasty   GERD (gastroesophageal reflux disease)    History of blood transfusion    during first child's birth   History of chicken pox    HTN (hypertension)    Hypercholesteremia    Migraines    Past Surgical History:  Procedure Laterality Date   ABDOMINAL HYSTERECTOMY  11/2017   partial hysterectomy; due to bladder prolapse. has bilateral ovaries.    BREAST CYST ASPIRATION Left    BUNIONECTOMY     DILATION AND CURETTAGE OF UTERUS     RHINOPLASTY     THYROID SURGERY  03/2020   parathyroidectomy   TONSILLECTOMY  1950   VAGINAL DELIVERY     x2   Family History  Problem Relation Age of Onset   Heart disease Mother    Diabetes Father    Heart failure Father    Dementia Father    Cancer Other        Breast, liver, lung   Breast cancer Neg Hx     Allergies: Patient has no known allergies. Current Outpatient Medications on File Prior to Visit  Medication Sig Dispense Refill   ALPRAZolam (XANAX) 0.25 MG tablet TAKE 1 TABLET(0.25 MG) BY MOUTH DAILY AS NEEDED FOR ANXIETY 30 tablet 2   aspirin EC 81 MG tablet Take 81 mg by mouth daily. Swallow whole.     ezetimibe (ZETIA) 10 MG tablet Take 1 tablet (10 mg total) by mouth daily. 90 tablet 3   No current facility-administered medications on file prior to visit.    Social History   Tobacco Use   Smoking status: Never   Smokeless tobacco: Never  Vaping Use    Vaping Use: Never used  Substance Use Topics   Alcohol use: Yes    Comment: Wine 2 to 3 times a week   Drug use: No    Review of Systems  Constitutional:  Negative for chills and fever.  Respiratory:  Negative for cough.   Cardiovascular:  Negative for chest pain and palpitations.  Gastrointestinal:  Negative for nausea and vomiting.  Musculoskeletal:  Negative for arthralgias.      Objective:    BP 132/82 (BP Location: Left Arm, Cuff Size: Normal)   Pulse 80   Temp 98.4 F (36.9 C) (Oral)   Ht 5' (1.524 m)   Wt 124 lb 12.8 oz (56.6 kg)   SpO2 96%   BMI 24.37 kg/m  BP Readings from Last 3 Encounters:  02/02/22 132/82  10/27/21 138/68  10/10/21 (!) 144/80   Wt Readings from Last 3 Encounters:  02/02/22 124 lb 12.8 oz (56.6 kg)  12/15/21 124 lb (56.2 kg)  10/27/21 124 lb 3.2 oz (56.3 kg)    Physical Exam Vitals reviewed.  Constitutional:      Appearance: She is well-developed.  Eyes:     Conjunctiva/sclera:  Conjunctivae normal.  Cardiovascular:     Rate and Rhythm: Normal rate and regular rhythm.     Pulses: Normal pulses.     Heart sounds: Normal heart sounds.  Pulmonary:     Effort: Pulmonary effort is normal.     Breath sounds: Normal breath sounds. No wheezing, rhonchi or rales.  Skin:    General: Skin is warm and dry.  Neurological:     Mental Status: She is alert.  Psychiatric:        Speech: Speech normal.        Behavior: Behavior normal.        Thought Content: Thought content normal.        Assessment & Plan:   Problem List Items Addressed This Visit       Cardiovascular and Mediastinum   Hypertension    Chronic, well controlled.  Continue toprol xl 37.'5mg'$       Relevant Medications   metoprolol succinate (TOPROL-XL) 25 MG 24 hr tablet   rosuvastatin (CRESTOR) 10 MG tablet     Other   Hyperlipidemia - Primary    Lab Results  Component Value Date   LDLCALC 110 (H) 10/27/2021  Discussed  statin would likely have greatest lowering  effect on LDL.  She will continue Zetia 10 mg.  She is very willing to try Crestor 10 mg once per day and gradually increase.  She will let me know how she is doing we will recheck liver enzymes at follow-up      Relevant Medications   metoprolol succinate (TOPROL-XL) 25 MG 24 hr tablet   rosuvastatin (CRESTOR) 10 MG tablet     I have discontinued Melissa Turner. [facility], chlorhexidine, amLODipine, and rosuvastatin. I have also changed her metoprolol succinate. Additionally, I am having her start on rosuvastatin. Lastly, I am having her maintain her aspirin EC, ezetimibe, and ALPRAZolam.   Meds ordered this encounter  Medications   metoprolol succinate (TOPROL-XL) 25 MG 24 hr tablet    Sig: Take 1.5 tablets (37.5 mg total) by mouth daily.    Dispense:  135 tablet    Refill:  3    Order Specific Question:   Supervising Provider    Answer:   Melissa Turner [[id number]]   rosuvastatin (CRESTOR) 10 MG tablet    Sig: Take 1 tablet (10 mg total) by mouth once a week.    Dispense:  30 tablet    Refill:  1    Order Specific Question:   Supervising Provider    Answer:   Melissa Turner [[id number]]    Return precautions given.   Risks, benefits, and alternatives of the medications and treatment plan prescribed today were discussed, and patient expressed understanding.   Education regarding symptom management and diagnosis given to patient on AVS.  Continue to follow with Melissa Hawthorne, FNP for routine health maintenance.   Melissa Turner and I agreed with plan.   Melissa Paris, FNP

## 2022-02-02 NOTE — Patient Instructions (Signed)
Blood pressure looks great As discussed, please resume Crestor 10 mg and start taking once per week.  Every couple weeks you may increase medication days /week by one day.   For example,  2 or 3 weeks from now you may increase to Crestor twice per week.  Let me know how you are doing

## 2022-02-02 NOTE — Assessment & Plan Note (Signed)
Lab Results  Component Value Date   LDLCALC 110 (H) 10/27/2021   Discussed  statin would likely have greatest lowering effect on LDL.  She will continue Zetia 10 mg.  She is very willing to try Crestor 10 mg once per day and gradually increase.  She will let me know how she is doing we will recheck liver enzymes at follow-up

## 2022-02-03 ENCOUNTER — Ambulatory Visit: Payer: PPO | Admitting: Dermatology

## 2022-02-03 ENCOUNTER — Encounter: Payer: Self-pay | Admitting: Dermatology

## 2022-02-03 DIAGNOSIS — L570 Actinic keratosis: Secondary | ICD-10-CM | POA: Diagnosis not present

## 2022-02-03 DIAGNOSIS — L988 Other specified disorders of the skin and subcutaneous tissue: Secondary | ICD-10-CM

## 2022-02-03 DIAGNOSIS — L578 Other skin changes due to chronic exposure to nonionizing radiation: Secondary | ICD-10-CM | POA: Diagnosis not present

## 2022-02-03 NOTE — Progress Notes (Signed)
Follow-Up Visit   Subjective  Melissa Turner is a 81 y.o. female who presents for the following: Facial Elastosis (Botox today) and Actinic Keratosis (Left medial cheek, left nasal tip - Recheck). Also with spot L cheek. The patient has spots, moles and lesions to be evaluated, some may be new or changing and the patient has concerns that these could be cancer.  The following portions of the chart were reviewed this encounter and updated as appropriate:   Tobacco  Allergies  Meds  Problems  Med Hx  Surg Hx  Fam Hx     Review of Systems:  No other skin or systemic complaints except as noted in HPI or Assessment and Plan.  Objective  Well appearing patient in no apparent distress; mood and affect are within normal limits.  A focused examination was performed including face. Relevant physical exam findings are noted in the Assessment and Plan.  Head - Anterior (Face) Rhytides and volume loss.      Left medial cheek Erythematous thin papules/macules with gritty scale.    Assessment & Plan  Elastosis of skin Head - Anterior (Face)  Botox today  Frown Complex - 20 units Crow's Feet - 10 units each side  Botox Injection - Head - Anterior (Face) Location: See attached image  Informed consent: Discussed risks (infection, pain, bleeding, bruising, swelling, allergic reaction, paralysis of nearby muscles, eyelid droop, double vision, neck weakness, difficulty breathing, headache, undesirable cosmetic result, and need for additional treatment) and benefits of the procedure, as well as the alternatives.  Informed consent was obtained.  Preparation: The area was cleansed with alcohol.  Procedure Details:  Botox was injected into the dermis with a 30-gauge needle. Pressure applied to any bleeding. Ice packs offered for swelling.  Lot Number:  [id number] Expiration:  11/2023  Total Units Injected:  40  Plan: Patient was instructed to remain upright for 4 hours. Patient  was instructed to avoid massaging the face and avoid vigorous exercise for the rest of the day. Tylenol may be used for headache.  Allow 2 weeks before returning to clinic for additional dosing as needed. Patient will call for any problems.   AK (actinic keratosis) Left medial cheek  Actinic Damage - Severe, confluent actinic changes with pre-cancerous actinic keratoses  - Severe, chronic, not at goal, secondary to cumulative UV radiation exposure over time - diffuse scaly erythematous macules and papules with underlying dyspigmentation - Discussed Prescription "Field Treatment" for Severe, Chronic Confluent Actinic Changes with Pre-Cancerous Actinic Keratoses Field treatment involves treatment of an entire area of skin that has confluent Actinic Changes (Sun/ Ultraviolet light damage) and PreCancerous Actinic Keratoses by method of PhotoDynamic Therapy (PDT) and/or prescription Topical Chemotherapy agents such as 5-fluorouracil, 5-fluorouracil/calcipotriene, and/or imiquimod.  The purpose is to decrease the number of clinically evident and subclinical PreCancerous lesions to prevent progression to development of skin cancer by chemically destroying early precancer changes that may or may not be visible.  It has been shown to reduce the risk of developing skin cancer in the treated area. As a result of treatment, redness, scaling, crusting, and open sores may occur during treatment course. One or more than one of these methods may be used and may have to be used several times to control, suppress and eliminate the PreCancerous changes. Discussed treatment course, expected reaction, and possible side effects. - Recommend daily broad spectrum sunscreen SPF 30+ to sun-exposed areas, reapply every 2 hours as needed.  - Staying in the  shade or wearing long sleeves, sun glasses (UVA+UVB protection) and wide brim hats (4-inch brim around the entire circumference of the hat) are also recommended. - Call for new  or changing lesions.  In one month, start Fluorouracil 5%/Calcipotriene cream bid to left medial cheek and nasal tip x 1 week - patient has medication at home  Destruction of lesion - Left medial cheek Complexity: simple   Destruction method: cryotherapy   Informed consent: discussed and consent obtained   Timeout:  patient name, date of birth, surgical site, and procedure verified Lesion destroyed using liquid nitrogen: Yes   Region frozen until ice ball extended beyond lesion: Yes   Outcome: patient tolerated procedure well with no complications   Post-procedure details: wound care instructions given     Return for Botox in 3-4 months.  I, Ashok Cordia, CMA, am acting as scribe for Sarina Ser, MD . Documentation: I have reviewed the above documentation for accuracy and completeness, and I agree with the above.  Sarina Ser, MD

## 2022-02-03 NOTE — Patient Instructions (Addendum)
Cryotherapy Aftercare  Wash gently with soap and water everyday.   Apply Vaseline and Band-Aid daily until healed.     Due to recent changes in healthcare laws, you may see results of your pathology and/or laboratory studies on MyChart before the doctors have had a chance to review them. We understand that in some cases there may be results that are confusing or concerning to you. Please understand that not all results are received at the same time and often the doctors may need to interpret multiple results in order to provide you with the best plan of care or course of treatment. Therefore, we ask that you please give us 2 business days to thoroughly review all your results before contacting the office for clarification. Should we see a critical lab result, you will be contacted sooner.   If You Need Anything After Your Visit  If you have any questions or concerns for your doctor, please call our main line at [phone] and press option 4 to reach your doctor's medical assistant. If no one answers, please leave a voicemail as directed and we will return your call as soon as possible. Messages left after 4 pm will be answered the following business day.   You may also send us a message via MyChart. We typically respond to MyChart messages within 1-2 business days.  For prescription refills, please ask your pharmacy to contact our office. Our fax number is [phone].  If you have an urgent issue when the clinic is closed that cannot wait until the next business day, you can page your doctor at the number below.    Please note that while we do our best to be available for urgent issues outside of office hours, we are not available 24/7.   If you have an urgent issue and are unable to reach us, you may choose to seek medical care at your doctor's office, retail clinic, urgent care center, or emergency room.  If you have a medical emergency, please immediately call 911 or go to the  emergency department.  Pager Numbers  - Dr. Kowalski: [phone]  - Dr. Moye: [phone]  - Dr. Stewart: [phone]  In the event of inclement weather, please call our main line at [phone] for an update on the status of any delays or closures.  Dermatology Medication Tips: Please keep the boxes that topical medications come in in order to help keep track of the instructions about where and how to use these. Pharmacies typically print the medication instructions only on the boxes and not directly on the medication tubes.   If your medication is too expensive, please contact our office at [phone] option 4 or send us a message through MyChart.   We are unable to tell what your co-pay for medications will be in advance as this is different depending on your insurance coverage. However, we may be able to find a substitute medication at lower cost or fill out paperwork to get insurance to cover a needed medication.   If a prior authorization is required to get your medication covered by your insurance company, please allow us 1-2 business days to complete this process.  Drug prices often vary depending on where the prescription is filled and some pharmacies may offer cheaper prices.  The website www.goodrx.com contains coupons for medications through different pharmacies. The prices here do not account for what the cost may be with help from insurance (it may be cheaper with your insurance), but the website can   give you the price if you did not use any insurance.  - You can print the associated coupon and take it with your prescription to the pharmacy.  - You may also stop by our office during regular business hours and pick up a GoodRx coupon card.  - If you need your prescription sent electronically to a different pharmacy, notify our office through [facility] MyChart or by phone at [phone] option 4.     Si Usted Necesita Algo Despus de Su Visita  Tambin puede  enviarnos un mensaje a travs de MyChart. Por lo general respondemos a los mensajes de MyChart en el transcurso de 1 a 2 das hbiles.  Para renovar recetas, por favor pida a su farmacia que se ponga en contacto con nuestra oficina. Nuestro nmero de fax es el [phone].  Si tiene un asunto urgente cuando la clnica est cerrada y que no puede esperar hasta el siguiente da hbil, puede llamar/localizar a su doctor(a) al nmero que aparece a continuacin.   Por favor, tenga en cuenta que aunque hacemos todo lo posible para estar disponibles para asuntos urgentes fuera del horario de oficina, no estamos disponibles las 24 horas del da, los 7 das de la semana.   Si tiene un problema urgente y no puede comunicarse con nosotros, puede optar por buscar atencin mdica  en el consultorio de su doctor(a), en una clnica privada, en un centro de atencin urgente o en una sala de emergencias.  Si tiene una emergencia mdica, por favor llame inmediatamente al 911 o vaya a la sala de emergencias.  Nmeros de bper  - Dr. Kowalski: [phone]  - Dra. Moye: [phone]  - Dra. Stewart: [phone]  En caso de inclemencias del tiempo, por favor llame a nuestra lnea principal al [phone] para una actualizacin sobre el estado de cualquier retraso o cierre.  Consejos para la medicacin en dermatologa: Por favor, guarde las cajas en las que vienen los medicamentos de uso tpico para ayudarle a seguir las instrucciones sobre dnde y cmo usarlos. Las farmacias generalmente imprimen las instrucciones del medicamento slo en las cajas y no directamente en los tubos del medicamento.   Si su medicamento es muy caro, por favor, pngase en contacto con nuestra oficina llamando al [phone] y presione la opcin 4 o envenos un mensaje a travs de MyChart.   No podemos decirle cul ser su copago por los medicamentos por adelantado ya que esto es diferente dependiendo de la cobertura de su seguro.  Sin embargo, es posible que podamos encontrar un medicamento sustituto a menor costo o llenar un formulario para que el seguro cubra el medicamento que se considera necesario.   Si se requiere una autorizacin previa para que su compaa de seguros cubra su medicamento, por favor permtanos de 1 a 2 das hbiles para completar este proceso.  Los precios de los medicamentos varan con frecuencia dependiendo del lugar de dnde se surte la receta y alguna farmacias pueden ofrecer precios ms baratos.  El sitio web www.goodrx.com tiene cupones para medicamentos de diferentes farmacias. Los precios aqu no tienen en cuenta lo que podra costar con la ayuda del seguro (puede ser ms barato con su seguro), pero el sitio web puede darle el precio si no utiliz ningn seguro.  - Puede imprimir el cupn correspondiente y llevarlo con su receta a la farmacia.  - Tambin puede pasar por nuestra oficina durante el horario de atencin regular y recoger una tarjeta de cupones de GoodRx.  -   Si necesita que su receta se enve electrnicamente a una farmacia diferente, informe a nuestra oficina a travs de MyChart de [facility] o por telfono llamando al [phone] y presione la opcin 4.  

## 2022-02-17 ENCOUNTER — Encounter: Payer: Self-pay | Admitting: Internal Medicine

## 2022-02-18 ENCOUNTER — Ambulatory Visit: Payer: PPO | Admitting: Certified Registered"

## 2022-02-18 ENCOUNTER — Ambulatory Visit
Admission: RE | Admit: 2022-02-18 | Discharge: 2022-02-18 | Disposition: A | Discharge status: Home / Self Care | Payer: PPO | Attending: Internal Medicine | Admitting: Internal Medicine

## 2022-02-18 ENCOUNTER — Encounter: Payer: Self-pay | Admitting: Internal Medicine

## 2022-02-18 ENCOUNTER — Encounter
Admission: RE | Disposition: A | Discharge status: Home / Self Care | Payer: Self-pay | Source: Home / Self Care | Attending: Internal Medicine

## 2022-02-18 DIAGNOSIS — F419 Anxiety disorder, unspecified: Secondary | ICD-10-CM | POA: Diagnosis not present

## 2022-02-18 DIAGNOSIS — E785 Hyperlipidemia, unspecified: Secondary | ICD-10-CM | POA: Insufficient documentation

## 2022-02-18 DIAGNOSIS — K5730 Diverticulosis of large intestine without perforation or abscess without bleeding: Secondary | ICD-10-CM | POA: Diagnosis not present

## 2022-02-18 DIAGNOSIS — K219 Gastro-esophageal reflux disease without esophagitis: Secondary | ICD-10-CM | POA: Insufficient documentation

## 2022-02-18 DIAGNOSIS — D123 Benign neoplasm of transverse colon: Secondary | ICD-10-CM | POA: Insufficient documentation

## 2022-02-18 DIAGNOSIS — M25569 Pain in unspecified knee: Secondary | ICD-10-CM | POA: Insufficient documentation

## 2022-02-18 DIAGNOSIS — K640 First degree hemorrhoids: Secondary | ICD-10-CM | POA: Insufficient documentation

## 2022-02-18 DIAGNOSIS — D126 Benign neoplasm of colon, unspecified: Secondary | ICD-10-CM | POA: Diagnosis not present

## 2022-02-18 DIAGNOSIS — K648 Other hemorrhoids: Secondary | ICD-10-CM | POA: Diagnosis not present

## 2022-02-18 DIAGNOSIS — Z86010 Personal history of colonic polyps: Secondary | ICD-10-CM | POA: Insufficient documentation

## 2022-02-18 DIAGNOSIS — I10 Essential (primary) hypertension: Secondary | ICD-10-CM | POA: Insufficient documentation

## 2022-02-18 DIAGNOSIS — Z1211 Encounter for screening for malignant neoplasm of colon: Secondary | ICD-10-CM | POA: Diagnosis not present

## 2022-02-18 HISTORY — DX: Hyperlipidemia, unspecified: E78.5

## 2022-02-18 HISTORY — PX: COLONOSCOPY: SHX5424

## 2022-02-18 HISTORY — PX: ESOPHAGOGASTRODUODENOSCOPY: SHX5428

## 2022-02-18 HISTORY — DX: Primary hyperparathyroidism: E21.0

## 2022-02-18 SURGERY — COLONOSCOPY
Anesthesia: General

## 2022-02-18 MED ORDER — PROPOFOL 10 MG/ML IV BOLUS
INTRAVENOUS | Status: DC | PRN
  Administered 2022-02-18: 20 mg via INTRAVENOUS
  Administered 2022-02-18: 70 mg via INTRAVENOUS

## 2022-02-18 MED ORDER — SODIUM CHLORIDE 0.9 % IV SOLN
INTRAVENOUS | Status: DC
  Administered 2022-02-18: 20 mL/h via INTRAVENOUS

## 2022-02-18 MED ORDER — PROPOFOL 500 MG/50ML IV EMUL
INTRAVENOUS | Status: DC | PRN
  Administered 2022-02-18: 120 ug/kg/min via INTRAVENOUS

## 2022-02-18 MED ORDER — LIDOCAINE 2% (20 MG/ML) 5 ML SYRINGE
INTRAMUSCULAR | Status: DC | PRN
  Administered 2022-02-18: 20 mg via INTRAVENOUS

## 2022-02-18 MED ORDER — GLYCOPYRROLATE 0.2 MG/ML IJ SOLN
INTRAMUSCULAR | Status: DC | PRN
  Administered 2022-02-18: .2 mg via INTRAVENOUS

## 2022-02-18 NOTE — Op Note (Signed)
[facility] Patient Name: Britnee Bronder Procedure Date: 02/18/2022 11:21 AM MRN: [id number] Account #: [id number] Date of Birth: 1941/02/13 Admit Type: Outpatient Age: 81 Room: [facility] ENDO ROOM 2 Gender: Female Note Status: Finalized Instrument Name: Jasper Riling [id number] Procedure:             Colonoscopy Indications:           Surveillance: Personal history of adenomatous polyps                         on last colonoscopy > 5 years ago Providers:             Lorie Apley K. Alice Reichert MD, MD Referring MD:          Yvetta Coder. Arnett (Referring MD) Medicines:             Propofol per Anesthesia Complications:         No immediate complications. Procedure:             Pre-Anesthesia Assessment:                        - The risks and benefits of the procedure and the                         sedation options and risks were discussed with the                         patient. All questions were answered and informed                         consent was obtained.                        - Patient identification and proposed procedure were                         verified prior to the procedure by the nurse. The                         procedure was verified in the procedure room.                        - ASA Grade Assessment: III - A patient with severe                         systemic disease.                        - After reviewing the risks and benefits, the patient                         was deemed in satisfactory condition to undergo the                         procedure.                        After obtaining informed consent, the colonoscope was  passed under direct vision. Throughout the procedure,                         the patient's blood pressure, pulse, and oxygen                         saturations were monitored continuously. The                         Colonoscope was introduced through the anus and                          advanced to the the cecum, identified by appendiceal                         orifice and ileocecal valve. The colonoscopy was                         performed without difficulty. The patient tolerated                         the procedure well. The quality of the bowel                         preparation was good. Findings:      The perianal and digital rectal examinations were normal. Pertinent       negatives include normal sphincter tone and no palpable rectal lesions.      Non-bleeding internal hemorrhoids were found during retroflexion. The       hemorrhoids were Grade I (internal hemorrhoids that do not prolapse).      A few small-mouthed diverticula were found in the sigmoid colon.      A 4 mm polyp was found in the proximal transverse colon. The polyp was       sessile. The polyp was removed with a jumbo cold forceps. Resection and       retrieval were complete.      The exam was otherwise without abnormality. Impression:            - Non-bleeding internal hemorrhoids.                        - Diverticulosis in the sigmoid colon.                        - One 4 mm polyp in the proximal transverse colon,                         removed with a jumbo cold forceps. Resected and                         retrieved.                        - The examination was otherwise normal. Recommendation:        - Patient has a contact number available for                         emergencies. The signs and symptoms of potential  delayed complications were discussed with the patient.                         Return to normal activities tomorrow. Written                         discharge instructions were provided to the patient.                        - Resume previous diet.                        - Continue present medications.                        - If polyps are benign or adenomatous without                         dysplasia, I will advise NO further colonoscopy due to                          advanced age and/or severe comorbidity.                        - Return to GI office PRN.                        - The findings and recommendations were discussed with                         the patient. Procedure Code(s):     --- Professional ---                        [id number], Colonoscopy, flexible; with biopsy, single or                         multiple Diagnosis Code(s):     --- Professional ---                        K57.30, Diverticulosis of large intestine without                         perforation or abscess without bleeding                        K63.5, Polyp of colon                        K64.0, First degree hemorrhoids                        Z86.010, Personal history of colonic polyps CPT copyright 2019 [employer]. All rights reserved. The codes documented in this report are preliminary and upon coder review may  be revised to meet current compliance requirements. Efrain Sella MD, MD 02/18/2022 11:52:07 AM This report has been signed electronically. Number of Addenda: 0 Note Initiated On: 02/18/2022 11:21 AM Scope Withdrawal Time: 0 hours 5 minutes 56 seconds  Total Procedure Duration: 0 hours 9 minutes 23 seconds  Estimated Blood Loss:  Estimated blood loss: none.  [facility]

## 2022-02-18 NOTE — Anesthesia Preprocedure Evaluation (Signed)
Anesthesia Evaluation  Patient identified by MRN, date of birth, ID band Patient awake    Reviewed: Allergy & Precautions, H&P , NPO status , Patient's Chart, lab work & pertinent test results, reviewed documented beta blocker date and time   History of Anesthesia Complications Negative for: history of anesthetic complications  Airway Mallampati: II  TM Distance: >3 FB Neck ROM: full    Dental  (+) Dental Advidsory Given, Implants, Caps, Teeth Intact Permanent bridge x2:   Pulmonary neg pulmonary ROS,    Pulmonary exam normal breath sounds clear to auscultation       Cardiovascular Exercise Tolerance: Good hypertension, (-) angina+ CAD  (-) Past MI and (-) Cardiac Stents Normal cardiovascular exam(-) dysrhythmias (-) Valvular Problems/Murmurs Rhythm:regular Rate:Normal     Neuro/Psych PSYCHIATRIC DISORDERS Anxiety negative neurological ROS     GI/Hepatic Neg liver ROS, GERD  ,  Endo/Other  negative endocrine ROS  Renal/GU negative Renal ROS  negative genitourinary   Musculoskeletal   Abdominal   Peds  Hematology negative hematology ROS (+)   Anesthesia Other Findings Past Medical History: No date: Deviated septum     Comment:  s/p rhinoplasty No date: GERD (gastroesophageal reflux disease) No date: History of blood transfusion     Comment:  during first child's birth No date: History of chicken pox No date: HTN (hypertension) No date: Hypercholesteremia No date: Hyperlipidemia No date: Migraines No date: Primary hyperparathyroidism (HCC)   Reproductive/Obstetrics negative OB ROS                             Anesthesia Physical Anesthesia Plan  ASA: 2  Anesthesia Plan: General   Post-op Pain Management:    Induction: Intravenous  PONV Risk Score and Plan: 3 and Propofol infusion and TIVA  Airway Management Planned: Natural Airway and Nasal Cannula  Additional Equipment:    Intra-op Plan:   Post-operative Plan:   Informed Consent: I have reviewed the patients History and Physical, chart, labs and discussed the procedure including the risks, benefits and alternatives for the proposed anesthesia with the patient or authorized representative who has indicated his/her understanding and acceptance.     Dental Advisory Given  Plan Discussed with: Anesthesiologist, CRNA and Surgeon  Anesthesia Plan Comments:         Anesthesia Quick Evaluation

## 2022-02-18 NOTE — Transfer of Care (Signed)
Immediate Anesthesia Transfer of Care Note  Patient: Melissa Turner  Procedure(s) Performed: COLONOSCOPY ESOPHAGOGASTRODUODENOSCOPY (EGD)  Patient Location: Endoscopy Unit  Anesthesia Type:General  Level of Consciousness: drowsy  Airway & Oxygen Therapy: Patient Spontanous Breathing  Post-op Assessment: Report given to RN and Post -op Vital signs reviewed and stable  Post vital signs: Reviewed  Last Vitals:  Vitals Value Taken Time  BP 127/79 02/18/22 1153  Temp    Pulse 85 02/18/22 1153  Resp 15 02/18/22 1153  SpO2 98 % 02/18/22 1153  Vitals shown include unvalidated device data.  Last Pain:  Vitals:   02/18/22 1041  TempSrc: Temporal  PainSc: 0-No pain         Complications: No notable events documented.

## 2022-02-18 NOTE — Interval H&P Note (Signed)
History and Physical Interval Note:  02/18/2022 11:22 AM  [name]  has presented today for surgery, with the diagnosis of History of colon polyps (Z86.010) Gastroesophageal reflux disease, unspecified whether esophagitis present (K21.9).  The various methods of treatment have been discussed with the patient and family. After consideration of risks, benefits and other options for treatment, the patient has consented to  Procedure(s): COLONOSCOPY (N/A) ESOPHAGOGASTRODUODENOSCOPY (EGD) (N/A) as a surgical intervention.  The patient's history has been reviewed, patient examined, no change in status, stable for surgery.  I have reviewed the patient's chart and labs.  Questions were answered to the patient's satisfaction.     Melissa Turner, [place]

## 2022-02-18 NOTE — Op Note (Signed)
[facility] Gastroenterology Patient Name: Melissa Turner Procedure Date: 02/18/2022 11:22 AM MRN: [id number] Account #: [id number] Date of Birth: 10/15/1940 Admit Type: Outpatient Age: 81 Room: [facility] ENDO ROOM 2 Gender: Female Note Status: Finalized Instrument Name: Upper Endoscope [id number] Procedure:             Upper GI endoscopy Indications:           Gastro-esophageal reflux disease Providers:             Lorie Apley K. Alice Reichert MD, MD Referring MD:          Yvetta Coder. Arnett (Referring MD) Medicines:             Propofol per Anesthesia Complications:         No immediate complications. Procedure:             Pre-Anesthesia Assessment:                        - The risks and benefits of the procedure and the                         sedation options and risks were discussed with the                         patient. All questions were answered and informed                         consent was obtained.                        - Patient identification and proposed procedure were                         verified prior to the procedure by the nurse. The                         procedure was verified in the procedure room.                        - ASA Grade Assessment: III - A patient with severe                         systemic disease.                        - After reviewing the risks and benefits, the patient                         was deemed in satisfactory condition to undergo the                         procedure.                        After obtaining informed consent, the endoscope was                         passed under direct vision. Throughout the procedure,  the patient's blood pressure, pulse, and oxygen                         saturations were monitored continuously. The Endoscope                         was introduced through the mouth, and advanced to the                         third part of duodenum. The upper GI endoscopy was                          accomplished without difficulty. The patient tolerated                         the procedure well. Findings:      The esophagus was normal.      The stomach was normal.      The examined duodenum was normal. Impression:            - Normal esophagus.                        - Normal stomach.                        - Normal examined duodenum.                        - No specimens collected. Recommendation:        - Proceed with colonoscopy Procedure Code(s):     --- Professional ---                        [phone], Esophagogastroduodenoscopy, flexible,                         transoral; diagnostic, including collection of                         specimen(s) by brushing or washing, when performed                         (separate procedure) Diagnosis Code(s):     --- Professional ---                        K21.9, Gastro-esophageal reflux disease without                         esophagitis CPT copyright 2019 American Medical Association. All rights reserved. The codes documented in this report are preliminary and upon coder review may  be revised to meet current compliance requirements. Efrain Sella MD, MD 02/18/2022 11:37:59 AM This report has been signed electronically. Number of Addenda: 0 Note Initiated On: 02/18/2022 11:22 AM Estimated Blood Loss:  Estimated blood loss: none.      [facility]

## 2022-02-18 NOTE — H&P (Signed)
Outpatient short stay form Pre-procedure 02/18/2022 11:18 AM Melissa Maish K. Alice Reichert, M.D.  Primary Physician: Mable Paris, FNP  Reason for visit:   1.Personal history of colon polyps-                             11/12/2016: colonoscopy: Screening; impression 2 diminutive TA polyps in the cecum, diverticulosis, internal hemorrhoids.  2. GERD  History of present illness:  Patient denies any change in bowel habits, blood or melena stool.No weight loss, anorexia, abdominal pain, change in bowel habits, or hematochezia. She is interested in getting a repeat colonoscopy and acknowledges this will probably be her last study d/t/advanced age.   She has heartburn with food indiscretion with foods like pizza, fried foods, spicy. She gets heartburn described as a burning uncomfortable sensation in the xiphoid area. She takes 1 or 2 Tums 3-4 times a week and it almost always resolves it. She will also take an over-the-counter pill from the [facility] store, does not know the name of it. Patient thinks the heartburn and epigastric burning started after she had COVID in 2020. No dysphagia, reflux, regurgitation, nausea/vomiting, or melena. Positive Advil x 1 for 3 days - knee pain.   Denies family history of gastrointestinal disease and malignancy    Current Facility-Administered Medications:    0.9 %  sodium chloride infusion, , Intravenous, Continuous, Avaeh Ewer, Benay Pike, MD, Last Rate: 20 mL/hr at 02/18/22 1053, 20 mL/hr at 02/18/22 1053  Medications Prior to Admission  Medication Sig Dispense Refill Last Dose   ALPRAZolam (XANAX) 0.25 MG tablet TAKE 1 TABLET(0.25 MG) BY MOUTH DAILY AS NEEDED FOR ANXIETY 30 tablet 2 02/17/2022   amLODipine (NORVASC) 5 MG tablet Take 5 mg by mouth daily.   Past Week   aspirin EC 81 MG tablet Take 325 mg by mouth daily. Swallow whole.   Past Week   ezetimibe (ZETIA) 10 MG tablet Take 1 tablet (10 mg total) by mouth daily. 90 tablet 3 Past Week   metoprolol succinate  (TOPROL-XL) 25 MG 24 hr tablet Take 1.5 tablets (37.5 mg total) by mouth daily. 135 tablet 3 02/18/2022 at 0600   Multiple Vitamins-Minerals (MULTIVITAMIN WITH MINERALS) tablet Take 1 tablet by mouth daily.   Past Week   rosuvastatin (CRESTOR) 10 MG tablet Take 1 tablet (10 mg total) by mouth once a week. 30 tablet 1 02/17/2022     No Known Allergies   Past Medical History:  Diagnosis Date   Deviated septum    s/p rhinoplasty   GERD (gastroesophageal reflux disease)    History of blood transfusion    during first child's birth   History of chicken pox    HTN (hypertension)    Hypercholesteremia    Hyperlipidemia    Migraines    Primary hyperparathyroidism ([place])     Review of systems:  Otherwise negative.    Physical Exam  Gen: Alert, oriented. Appears stated age.  HEENT: [place]/AT. PERRLA. Lungs: CTA, no wheezes. CV: RR nl S1, S2. Abd: soft, benign, no masses. BS+ Ext: No edema. Pulses 2+    Planned procedures: Proceed with EGD and colonoscopy. The patient understands the nature of the planned procedure, indications, risks, alternatives and potential complications including but not limited to bleeding, infection, perforation, damage to internal organs and possible oversedation/side effects from anesthesia. The patient agrees and gives consent to proceed.  Please refer to procedure notes for findings, recommendations and patient disposition/instructions.  Zaidy Absher K. Alice Reichert, M.D. Gastroenterology 02/18/2022  11:18 AM

## 2022-02-19 ENCOUNTER — Encounter: Payer: Self-pay | Admitting: Internal Medicine

## 2022-02-19 LAB — SURGICAL PATHOLOGY

## 2022-02-24 NOTE — Anesthesia Postprocedure Evaluation (Signed)
Anesthesia Post Note  Patient: Melissa Turner  Procedure(s) Performed: COLONOSCOPY ESOPHAGOGASTRODUODENOSCOPY (EGD)  Patient location during evaluation: Endoscopy Anesthesia Type: General Level of consciousness: awake and alert Pain management: pain level controlled Vital Signs Assessment: post-procedure vital signs reviewed and stable Respiratory status: spontaneous breathing, nonlabored ventilation, respiratory function stable and patient connected to nasal cannula oxygen Cardiovascular status: blood pressure returned to baseline and stable Postop Assessment: no apparent nausea or vomiting Anesthetic complications: no   No notable events documented.   Last Vitals:  Vitals:   02/18/22 1203 02/18/22 1213  BP:  (!) 152/86  Pulse: 86 84  Resp: 16   Temp:    SpO2: 100% 100%    Last Pain:  Vitals:   02/19/22 0746  TempSrc:   PainSc: 0-No pain                 Martha Clan

## 2022-03-03 ENCOUNTER — Ambulatory Visit: Payer: PPO | Admitting: Dermatology

## 2022-03-11 DIAGNOSIS — Z86010 Personal history of colonic polyps: Secondary | ICD-10-CM | POA: Diagnosis not present

## 2022-03-11 DIAGNOSIS — K219 Gastro-esophageal reflux disease without esophagitis: Secondary | ICD-10-CM | POA: Diagnosis not present

## 2022-03-13 DIAGNOSIS — M25562 Pain in left knee: Secondary | ICD-10-CM | POA: Diagnosis not present

## 2022-03-13 DIAGNOSIS — M170 Bilateral primary osteoarthritis of knee: Secondary | ICD-10-CM | POA: Diagnosis not present

## 2022-03-13 DIAGNOSIS — M25561 Pain in right knee: Secondary | ICD-10-CM | POA: Diagnosis not present

## 2022-04-28 DIAGNOSIS — D3131 Benign neoplasm of right choroid: Secondary | ICD-10-CM | POA: Diagnosis not present

## 2022-04-28 DIAGNOSIS — H2513 Age-related nuclear cataract, bilateral: Secondary | ICD-10-CM | POA: Diagnosis not present

## 2022-04-28 DIAGNOSIS — H35031 Hypertensive retinopathy, right eye: Secondary | ICD-10-CM | POA: Diagnosis not present

## 2022-05-05 ENCOUNTER — Encounter: Payer: Self-pay | Admitting: Family

## 2022-05-05 ENCOUNTER — Ambulatory Visit (INDEPENDENT_AMBULATORY_CARE_PROVIDER_SITE_OTHER): Payer: PPO | Admitting: Family

## 2022-05-05 VITALS — BP 132/82 | HR 79 | Temp 98.4°F | Ht 60.0 in | Wt 124.4 lb

## 2022-05-05 DIAGNOSIS — I10 Essential (primary) hypertension: Secondary | ICD-10-CM

## 2022-05-05 DIAGNOSIS — I2584 Coronary atherosclerosis due to calcified coronary lesion: Secondary | ICD-10-CM | POA: Diagnosis not present

## 2022-05-05 DIAGNOSIS — I2510 Atherosclerotic heart disease of native coronary artery without angina pectoris: Secondary | ICD-10-CM | POA: Diagnosis not present

## 2022-05-05 MED ORDER — SIMVASTATIN 20 MG PO TABS: 20.0000 mg | ORAL_TABLET | Freq: Every evening | ORAL | 3 refills | Status: DC

## 2022-05-05 NOTE — Assessment & Plan Note (Addendum)
Patient previously intolerant to Crestor and Lipitor.  She is compliant with Zetia 10 mg.  Goal of LDL less than 70.  Reviewed previous CT calcium score with her today.  We agreed to trial simvastatin 20 mg.  If intolerant to simvastatin, we agreed to lastly try protocol before a trial of PCSK9.  Pending hepatic enzymes in 6 weeks.  Patient will schedule

## 2022-05-05 NOTE — Assessment & Plan Note (Signed)
Chronic, stable.  Doing well on Toprol 37.5 mg.  She is no longer on amlodipine.

## 2022-05-05 NOTE — Progress Notes (Signed)
Subjective:    Patient ID: Melissa Turner, female    DOB: 20-Dec-1940, 81 y.o.   MRN: [id number]  CC: Melissa Turner is a 81 y.o. female who presents today for follow up.   HPI: Feels well today.  No new complaints   HTN-compliant with Toprol 37.5 mg Hyperlipidemia-compliant with Zetia 10 mg daily. She was unable to tolerate Crestor due to arthralgia. She was intolerant lipitor.   HISTORY:  Past Medical History:  Diagnosis Date   Deviated septum    s/p rhinoplasty   GERD (gastroesophageal reflux disease)    History of blood transfusion    during first child's birth   History of chicken pox    HTN (hypertension)    Hypercholesteremia    Hyperlipidemia    Migraines    Primary hyperparathyroidism ([place])    Past Surgical History:  Procedure Laterality Date   ABDOMINAL HYSTERECTOMY  11/2017   partial hysterectomy; due to bladder prolapse. has bilateral ovaries.    BREAST CYST ASPIRATION Left    BUNIONECTOMY     COLONOSCOPY     07/12/2006, 11/12/2016   COLONOSCOPY N/A 02/18/2022   Procedure: COLONOSCOPY;  Surgeon: Toledo, Benay Pike, MD;  Location: [facility] ENDOSCOPY;  Service: Gastroenterology;  Laterality: N/A;   DILATION AND CURETTAGE OF UTERUS     ear lobe revision     ESOPHAGOGASTRODUODENOSCOPY N/A 02/18/2022   Procedure: ESOPHAGOGASTRODUODENOSCOPY (EGD);  Surgeon: Toledo, Benay Pike, MD;  Location: [facility] ENDOSCOPY;  Service: Gastroenterology;  Laterality: N/A;   NOSE SURGERY  1970   PARATHYROIDECTOMY  03/29/2020   RHINOPLASTY     THYROID SURGERY  03/2020   parathyroidectomy   TONSILLECTOMY  1950   VAGINAL DELIVERY     x2   Family History  Problem Relation Age of Onset   Heart disease Mother    Diabetes Father    Heart failure Father    Dementia Father    Cancer Other        Breast, liver, lung   Dementia Paternal Grandfather    Heart disease Maternal Grandfather    Heart attack Maternal Grandfather    Stroke Paternal Grandmother    Diabetes Daughter    Asthma  Daughter    Breast cancer Neg Hx     Allergies: Patient has no known allergies. Current Outpatient Medications on File Prior to Visit  Medication Sig Dispense Refill   ALPRAZolam (XANAX) 0.25 MG tablet TAKE 1 TABLET(0.25 MG) BY MOUTH DAILY AS NEEDED FOR ANXIETY 30 tablet 2   aspirin EC 81 MG tablet Take 325 mg by mouth daily. Swallow whole.     ezetimibe (ZETIA) 10 MG tablet Take 1 tablet (10 mg total) by mouth daily. 90 tablet 3   metoprolol succinate (TOPROL-XL) 25 MG 24 hr tablet Take 1.5 tablets (37.5 mg total) by mouth daily. 135 tablet 3   Multiple Vitamins-Minerals (MULTIVITAMIN WITH MINERALS) tablet Take 1 tablet by mouth daily.     No current facility-administered medications on file prior to visit.    Social History   Tobacco Use   Smoking status: Never   Smokeless tobacco: Never  Vaping Use   Vaping Use: Never used  Substance Use Topics   Alcohol use: Yes    Comment: Wine 2 to 3 times a week   Drug use: No    Review of Systems  Constitutional:  Negative for chills and fever.  Respiratory:  Negative for cough.   Cardiovascular:  Negative for chest pain and palpitations.  Gastrointestinal:  Negative for nausea and vomiting.      Objective:    BP 132/82 (BP Location: Left Arm, Patient Position: Sitting, Cuff Size: Normal)   Pulse 79   Temp 98.4 F (36.9 C) (Oral)   Ht 5' (1.524 m)   Wt 124 lb 6.4 oz (56.4 kg)   SpO2 98%   BMI 24.30 kg/m  BP Readings from Last 3 Encounters:  05/05/22 132/82  02/18/22 (!) 152/86  02/02/22 132/82   Wt Readings from Last 3 Encounters:  05/05/22 124 lb 6.4 oz (56.4 kg)  02/18/22 123 lb (55.8 kg)  02/02/22 124 lb 12.8 oz (56.6 kg)    Physical Exam Vitals reviewed.  Constitutional:      Appearance: She is well-developed.  Eyes:     Conjunctiva/sclera: Conjunctivae normal.  Cardiovascular:     Rate and Rhythm: Normal rate and regular rhythm.     Pulses: Normal pulses.     Heart sounds: Normal heart sounds.   Pulmonary:     Effort: Pulmonary effort is normal.     Breath sounds: Normal breath sounds. No wheezing, rhonchi or rales.  Skin:    General: Skin is warm and dry.  Neurological:     Mental Status: She is alert.  Psychiatric:        Speech: Speech normal.        Behavior: Behavior normal.        Thought Content: Thought content normal.        Assessment & Plan:   Problem List Items Addressed This Visit       Cardiovascular and Mediastinum   Coronary artery calcification - Primary    Patient previously intolerant to Crestor and Lipitor.  She is compliant with Zetia 10 mg.  Goal of LDL less than 70.  Reviewed previous CT calcium score with her today.  We agreed to trial simvastatin 20 mg.  If intolerant to simvastatin, we agreed to lastly try protocol before a trial of PCSK9.  Pending hepatic enzymes in 6 weeks.  Patient will schedule      Relevant Medications   simvastatin (ZOCOR) 20 MG tablet   Other Relevant Orders   Hemoglobin A1c   Comprehensive metabolic panel   Hypertension    Chronic, stable.  Doing well on Toprol 37.5 mg.  She is no longer on amlodipine.      Relevant Medications   simvastatin (ZOCOR) 20 MG tablet     I have discontinued Emeli Goguen. Marzo's rosuvastatin and amLODipine. I am also having her start on simvastatin. Additionally, I am having her maintain her aspirin EC, ezetimibe, ALPRAZolam, metoprolol succinate, and multivitamin with minerals.   Meds ordered this encounter  Medications   simvastatin (ZOCOR) 20 MG tablet    Sig: Take 1 tablet (20 mg total) by mouth every evening.    Dispense:  90 tablet    Refill:  3    Order Specific Question:   Supervising Provider    Answer:   Crecencio Mc [[id number]]    Return precautions given.   Risks, benefits, and alternatives of the medications and treatment plan prescribed today were discussed, and patient expressed understanding.   Education regarding symptom management and diagnosis given to  patient on AVS.  Continue to follow with Burnard Hawthorne, FNP for routine health maintenance.   [place] and I agreed with plan.   Mable Paris, FNP

## 2022-05-07 ENCOUNTER — Other Ambulatory Visit: Payer: Self-pay | Admitting: Family

## 2022-05-07 DIAGNOSIS — Z1231 Encounter for screening mammogram for malignant neoplasm of breast: Secondary | ICD-10-CM

## 2022-05-25 DIAGNOSIS — M2021 Hallux rigidus, right foot: Secondary | ICD-10-CM | POA: Diagnosis not present

## 2022-05-25 DIAGNOSIS — M2011 Hallux valgus (acquired), right foot: Secondary | ICD-10-CM | POA: Diagnosis not present

## 2022-05-25 DIAGNOSIS — M792 Neuralgia and neuritis, unspecified: Secondary | ICD-10-CM | POA: Diagnosis not present

## 2022-05-25 DIAGNOSIS — M79671 Pain in right foot: Secondary | ICD-10-CM | POA: Diagnosis not present

## 2022-06-08 ENCOUNTER — Ambulatory Visit
Admission: RE | Admit: 2022-06-08 | Discharge: 2022-06-08 | Disposition: A | Discharge status: Home / Self Care | Payer: PPO | Source: Ambulatory Visit | Attending: Family | Admitting: Family

## 2022-06-08 DIAGNOSIS — Z1231 Encounter for screening mammogram for malignant neoplasm of breast: Secondary | ICD-10-CM | POA: Insufficient documentation

## 2022-06-09 ENCOUNTER — Ambulatory Visit (INDEPENDENT_AMBULATORY_CARE_PROVIDER_SITE_OTHER): Payer: Self-pay | Admitting: Dermatology

## 2022-06-09 DIAGNOSIS — L988 Other specified disorders of the skin and subcutaneous tissue: Secondary | ICD-10-CM

## 2022-06-09 DIAGNOSIS — L814 Other melanin hyperpigmentation: Secondary | ICD-10-CM

## 2022-06-09 DIAGNOSIS — L578 Other skin changes due to chronic exposure to nonionizing radiation: Secondary | ICD-10-CM

## 2022-06-09 DIAGNOSIS — Z872 Personal history of diseases of the skin and subcutaneous tissue: Secondary | ICD-10-CM

## 2022-06-09 NOTE — Progress Notes (Signed)
   Follow-Up Visit   Subjective  Melissa Turner is a 81 y.o. female who presents for the following: Facial Elastosis (Face, pt presents for botox today). The patient has spots, moles and lesions to be evaluated, some may be new or changing and the patient has concerns that these could be cancer.  The following portions of the chart were reviewed this encounter and updated as appropriate:   Tobacco  Allergies  Meds  Problems  Med Hx  Surg Hx  Fam Hx     Review of Systems:  No other skin or systemic complaints except as noted in HPI or Assessment and Plan.  Objective  Well appearing patient in no apparent distress; mood and affect are within normal limits.  A focused examination was performed including face. Relevant physical exam findings are noted in the Assessment and Plan.  face Rhytides and volume loss.        Assessment & Plan  Elastosis of skin face  Botox 40 units injected today as marked: - Frown complex 20 units - Crow's feet 10 units x 2  Botox Injection - face Location: frown complex, bil crow's feet  Informed consent: Discussed risks (infection, pain, bleeding, bruising, swelling, allergic reaction, paralysis of nearby muscles, eyelid droop, double vision, neck weakness, difficulty breathing, headache, undesirable cosmetic result, and need for additional treatment) and benefits of the procedure, as well as the alternatives.  Informed consent was obtained.  Preparation: The area was cleansed with alcohol.  Procedure Details:  Botox was injected into the dermis with a 30-gauge needle. Pressure applied to any bleeding. Ice packs offered for swelling.  Lot Number:  [id number] Expiration:  07/2024  Total Units Injected:  40  Plan: Patient was instructed to remain upright for 4 hours. Patient was instructed to avoid massaging the face and avoid vigorous exercise for the rest of the day. Tylenol may be used for headache.  Allow 2 weeks before returning to  clinic for additional dosing as needed. Patient will call for any problems.  History of PreCancerous Actinic Keratosis  - site(s) of PreCancerous Actinic Keratosis clear today. - these may recur and new lesions may form requiring treatment to prevent transformation into skin cancer - observe for new or changing spots and contact [place] for appointment if occur - photoprotection with sun protective clothing; sunglasses and broad spectrum sunscreen with SPF of at least 30 + and frequent self skin exams recommended - yearly exams by a dermatologist recommended for persons with history of PreCancerous Actinic Keratoses  Actinic Damage - chronic, secondary to cumulative UV radiation exposure/sun exposure over time - diffuse scaly erythematous macules with underlying dyspigmentation - Recommend daily broad spectrum sunscreen SPF 30+ to sun-exposed areas, reapply every 2 hours as needed.  - Recommend staying in the shade or wearing long sleeves, sun glasses (UVA+UVB protection) and wide brim hats (4-inch brim around the entire circumference of the hat). - Call for new or changing lesions.  Lentigines - Scattered tan macules - Due to sun exposure - Benign-appearing, observe - Recommend daily broad spectrum sunscreen SPF 30+ to sun-exposed areas, reapply every 2 hours as needed. - Call for any changes  Return for 3-4mBotox.  I, SOthelia Pulling RMA, am acting as scribe for DSarina Ser MD . Documentation: I have reviewed the above documentation for accuracy and completeness, and I agree with the above.  DSarina Ser MD

## 2022-06-09 NOTE — Patient Instructions (Signed)
Due to recent changes in healthcare laws, you may see results of your pathology and/or laboratory studies on MyChart before the doctors have had a chance to review them. We understand that in some cases there may be results that are confusing or concerning to you. Please understand that not all results are received at the same time and often the doctors may need to interpret multiple results in order to provide you with the best plan of care or course of treatment. Therefore, we ask that you please give us 2 business days to thoroughly review all your results before contacting the office for clarification. Should we see a critical lab result, you will be contacted sooner.   If You Need Anything After Your Visit  If you have any questions or concerns for your doctor, please call our main line at [phone] and press option 4 to reach your doctor's medical assistant. If no one answers, please leave a voicemail as directed and we will return your call as soon as possible. Messages left after 4 pm will be answered the following business day.   You may also send us a message via MyChart. We typically respond to MyChart messages within 1-2 business days.  For prescription refills, please ask your pharmacy to contact our office. Our fax number is [phone].  If you have an urgent issue when the clinic is closed that cannot wait until the next business day, you can page your doctor at the number below.    Please note that while we do our best to be available for urgent issues outside of office hours, we are not available 24/7.   If you have an urgent issue and are unable to reach us, you may choose to seek medical care at your doctor's office, retail clinic, urgent care center, or emergency room.  If you have a medical emergency, please immediately call 911 or go to the emergency department.  Pager Numbers  - Dr. Kowalski: [phone]  - Dr. Moye: [phone]  - Dr. Stewart:  [phone]  In the event of inclement weather, please call our main line at [phone] for an update on the status of any delays or closures.  Dermatology Medication Tips: Please keep the boxes that topical medications come in in order to help keep track of the instructions about where and how to use these. Pharmacies typically print the medication instructions only on the boxes and not directly on the medication tubes.   If your medication is too expensive, please contact our office at [phone] option 4 or send us a message through MyChart.   We are unable to tell what your co-pay for medications will be in advance as this is different depending on your insurance coverage. However, we may be able to find a substitute medication at lower cost or fill out paperwork to get insurance to cover a needed medication.   If a prior authorization is required to get your medication covered by your insurance company, please allow us 1-2 business days to complete this process.  Drug prices often vary depending on where the prescription is filled and some pharmacies may offer cheaper prices.  The website www.goodrx.com contains coupons for medications through different pharmacies. The prices here do not account for what the cost may be with help from insurance (it may be cheaper with your insurance), but the website can give you the price if you did not use any insurance.  - You can print the associated coupon and take it with   your prescription to the pharmacy.  - You may also stop by our office during regular business hours and pick up a GoodRx coupon card.  - If you need your prescription sent electronically to a different pharmacy, notify our office through [facility] MyChart or by phone at [phone] option 4.     Si Usted Necesita Algo Despus de Su Visita  Tambin puede enviarnos un mensaje a travs de MyChart. Por lo general respondemos a los mensajes de MyChart en el transcurso de 1 a 2  das hbiles.  Para renovar recetas, por favor pida a su farmacia que se ponga en contacto con nuestra oficina. Nuestro nmero de fax es el [phone].  Si tiene un asunto urgente cuando la clnica est cerrada y que no puede esperar hasta el siguiente da hbil, puede llamar/localizar a su doctor(a) al nmero que aparece a continuacin.   Por favor, tenga en cuenta que aunque hacemos todo lo posible para estar disponibles para asuntos urgentes fuera del horario de oficina, no estamos disponibles las 24 horas del da, los 7 das de la semana.   Si tiene un problema urgente y no puede comunicarse con nosotros, puede optar por buscar atencin mdica  en el consultorio de su doctor(a), en una clnica privada, en un centro de atencin urgente o en una sala de emergencias.  Si tiene una emergencia mdica, por favor llame inmediatamente al 911 o vaya a la sala de emergencias.  Nmeros de bper  - Dr. Kowalski: [phone]  - Dra. Moye: [phone]  - Dra. Stewart: [phone]  En caso de inclemencias del tiempo, por favor llame a nuestra lnea principal al [phone] para una actualizacin sobre el estado de cualquier retraso o cierre.  Consejos para la medicacin en dermatologa: Por favor, guarde las cajas en las que vienen los medicamentos de uso tpico para ayudarle a seguir las instrucciones sobre dnde y cmo usarlos. Las farmacias generalmente imprimen las instrucciones del medicamento slo en las cajas y no directamente en los tubos del medicamento.   Si su medicamento es muy caro, por favor, pngase en contacto con nuestra oficina llamando al [phone] y presione la opcin 4 o envenos un mensaje a travs de MyChart.   No podemos decirle cul ser su copago por los medicamentos por adelantado ya que esto es diferente dependiendo de la cobertura de su seguro. Sin embargo, es posible que podamos encontrar un medicamento sustituto a menor costo o llenar un formulario para que el  seguro cubra el medicamento que se considera necesario.   Si se requiere una autorizacin previa para que su compaa de seguros cubra su medicamento, por favor permtanos de 1 a 2 das hbiles para completar este proceso.  Los precios de los medicamentos varan con frecuencia dependiendo del lugar de dnde se surte la receta y alguna farmacias pueden ofrecer precios ms baratos.  El sitio web www.goodrx.com tiene cupones para medicamentos de diferentes farmacias. Los precios aqu no tienen en cuenta lo que podra costar con la ayuda del seguro (puede ser ms barato con su seguro), pero el sitio web puede darle el precio si no utiliz ningn seguro.  - Puede imprimir el cupn correspondiente y llevarlo con su receta a la farmacia.  - Tambin puede pasar por nuestra oficina durante el horario de atencin regular y recoger una tarjeta de cupones de GoodRx.  - Si necesita que su receta se enve electrnicamente a una farmacia diferente, informe a nuestra oficina a travs de MyChart de [facility]   o por telfono llamando al [phone] y presione la opcin 4.  

## 2022-06-16 ENCOUNTER — Other Ambulatory Visit: Payer: PPO

## 2022-06-17 ENCOUNTER — Other Ambulatory Visit (INDEPENDENT_AMBULATORY_CARE_PROVIDER_SITE_OTHER): Payer: PPO

## 2022-06-17 DIAGNOSIS — I2584 Coronary atherosclerosis due to calcified coronary lesion: Secondary | ICD-10-CM

## 2022-06-17 DIAGNOSIS — I2510 Atherosclerotic heart disease of native coronary artery without angina pectoris: Secondary | ICD-10-CM

## 2022-06-17 LAB — COMPREHENSIVE METABOLIC PANEL
ALT: 23 U/L (ref 0–35)
AST: 24 U/L (ref 0–37)
Albumin: 4.5 g/dL (ref 3.5–5.2)
Alkaline Phosphatase: 69 U/L (ref 39–117)
BUN: 15 mg/dL (ref 6–23)
CO2: 31 mEq/L (ref 19–32)
Calcium: 9.8 mg/dL (ref 8.4–10.5)
Chloride: 101 mEq/L (ref 96–112)
Creatinine, Ser: 0.7 mg/dL (ref 0.40–1.20)
GFR: 82.26 mL/min (ref >60.00)
Glucose, Bld: 97 mg/dL (ref 70–99)
Potassium: 4.3 mEq/L (ref 3.5–5.1)
Sodium: 138 mEq/L (ref 135–145)
Total Bilirubin: 0.7 mg/dL (ref 0.2–1.2)
Total Protein: 6.9 g/dL (ref 6.0–8.3)

## 2022-06-17 LAB — HEMOGLOBIN A1C: Hgb A1c MFr Bld: 5.9 % (ref 4.6–6.5)

## 2022-06-19 ENCOUNTER — Encounter: Payer: Self-pay | Admitting: Dermatology

## 2022-07-13 ENCOUNTER — Other Ambulatory Visit: Payer: Self-pay | Admitting: Family

## 2022-07-13 DIAGNOSIS — F419 Anxiety disorder, unspecified: Secondary | ICD-10-CM

## 2022-08-18 DIAGNOSIS — M25422 Effusion, left elbow: Secondary | ICD-10-CM | POA: Diagnosis not present

## 2022-08-18 DIAGNOSIS — M25522 Pain in left elbow: Secondary | ICD-10-CM | POA: Diagnosis not present

## 2022-08-25 DIAGNOSIS — M25522 Pain in left elbow: Secondary | ICD-10-CM | POA: Diagnosis not present

## 2022-10-05 NOTE — Progress Notes (Unsigned)
Cardiology Office Note  Date:  10/06/2022   ID:  Melissa Turner, DOB [birth year], MRN [id number]  PCP:  Burnard Hawthorne, FNP   Chief Complaint  Patient presents with   12 month follow up     Patient c/o not able to tolerate statin drugs due to muscle/joint pain. Medications reviewed by the patient verbally.     HPI:  Melissa Turner is a 82 year old woman with history of nonsmoker HTN Hyperlipidemia CAD, Elevated CT coronary calcium score 104 on scan October 2018 PAD, Mild aortic atherosclerosis on CT 2018 Who presents for follow-up of her coronary artery disease, hypertension  Last seen in clinic February 2023 Reports recent URI, slowly getting better  Reports having difficulty tolerating Crestor Tried 10 mg up to 20 mg, had joint and muscle leg Tried simvastatin, joint and muscle ache again had to stop the medication Reports that symptoms resolved without statin Able to tolerate Zetia 10 mg daily  Does not check blood pressure at home Well-controlled when seen by primary care September 2023, mildly elevated today  Denies chest pain or shortness of breath, no leg swelling  Some issues with her knees, popping out at times Parathyroid surgery Aug 2021  Sept 2021, covid Long recovery Quit metoprolol when she had covid Joint pain  EKG personally reviewed by myself on todays visit NSR rate 73 bpm no significant ST or T wave changes  Lab Results  Component Value Date   CHOL 184 10/27/2021   HDL 40.70 10/27/2021   LDLCALC 110 (H) 10/27/2021   TRIG 166.0 (H) 10/27/2021    Other past medical history reviewed Family history reviewed on today's visit Mom with CAD age 41, PCI, smoker Father possible CHF, diabetes GM with MI, 21, died of CVA, smoker  PMH:   has a past medical history of Deviated septum, GERD (gastroesophageal reflux disease), History of blood transfusion, History of chicken pox, HTN (hypertension), Hypercholesteremia, Hyperlipidemia, Migraines, and  Primary hyperparathyroidism ([place]).  PSH:    Past Surgical History:  Procedure Laterality Date   ABDOMINAL HYSTERECTOMY  11/2017   partial hysterectomy; due to bladder prolapse. has bilateral ovaries.    BREAST CYST ASPIRATION Left    BUNIONECTOMY     COLONOSCOPY     07/12/2006, 11/12/2016   COLONOSCOPY N/A 02/18/2022   Procedure: COLONOSCOPY;  Surgeon: Toledo, Benay Pike, MD;  Location: [facility] ENDOSCOPY;  Service: Gastroenterology;  Laterality: N/A;   DILATION AND CURETTAGE OF UTERUS     ear lobe revision     ESOPHAGOGASTRODUODENOSCOPY N/A 02/18/2022   Procedure: ESOPHAGOGASTRODUODENOSCOPY (EGD);  Surgeon: Toledo, Benay Pike, MD;  Location: [facility] ENDOSCOPY;  Service: Gastroenterology;  Laterality: N/A;   NOSE SURGERY  1970   PARATHYROIDECTOMY  03/29/2020   RHINOPLASTY     THYROID SURGERY  03/2020   parathyroidectomy   TONSILLECTOMY  1950   VAGINAL DELIVERY     x2    Current Outpatient Medications  Medication Sig Dispense Refill   ALPRAZolam (XANAX) 0.25 MG tablet TAKE 1 TABLET(0.25 MG) BY MOUTH DAILY AS NEEDED FOR ANXIETY 30 tablet 2   aspirin EC 81 MG tablet Take 325 mg by mouth daily. Swallow whole.     ezetimibe (ZETIA) 10 MG tablet Take 1 tablet (10 mg total) by mouth daily. 90 tablet 3   metoprolol succinate (TOPROL-XL) 25 MG 24 hr tablet Take 1.5 tablets (37.5 mg total) by mouth daily. 135 tablet 3   Multiple Vitamins-Minerals (MULTIVITAMIN WITH MINERALS) tablet Take 1 tablet by mouth daily.  No current facility-administered medications for this visit.   Allergies:   Patient has no known allergies.   Social History:  The patient  reports that she has never smoked. She has never used smokeless tobacco. She reports current alcohol use. She reports that she does not use drugs.   Family History:   family history includes Asthma in her daughter; Cancer in an other family member; Dementia in her father and paternal grandfather; Diabetes in her daughter and father; Heart attack in  her maternal grandfather; Heart disease in her maternal grandfather and mother; Heart failure in her father; Stroke in her paternal grandmother.    Review of Systems: Review of Systems  Constitutional: Negative.   HENT: Negative.    Respiratory: Negative.    Cardiovascular: Negative.   Gastrointestinal: Negative.   Musculoskeletal:  Positive for joint pain.  Neurological: Negative.   Psychiatric/Behavioral: Negative.    All other systems reviewed and are negative.   PHYSICAL EXAM: VS:  BP (!) 144/80 (BP Location: Left Arm, Patient Position: Sitting, Cuff Size: Normal)   Pulse 73   Ht 5' (1.524 m)   Wt 123 lb 4 oz (55.9 kg)   SpO2 98%   BMI 24.07 kg/m  , BMI Body mass index is 24.07 kg/m. Constitutional:  oriented to person, place, and time. No distress.  HENT:  Head: Grossly normal Eyes:  no discharge. No scleral icterus.  Neck: No JVD, no carotid bruits  Cardiovascular: Regular rate and rhythm, no murmurs appreciated Pulmonary/Chest: Clear to auscultation bilaterally, no wheezes or rails Abdominal: Soft.  no distension.  no tenderness.  Musculoskeletal: Normal range of motion Neurological:  normal muscle tone. Coordination normal. No atrophy Skin: Skin warm and dry Psychiatric: normal affect, pleasant  Recent Labs: 10/27/2021: Hemoglobin 15.0; Platelets 237.0; TSH 2.81 06/17/2022: ALT 23; BUN 15; Creatinine, Ser 0.70; Potassium 4.3; Sodium 138    Lipid Panel Lab Results  Component Value Date   CHOL 184 10/27/2021   HDL 40.70 10/27/2021   LDLCALC 110 (H) 10/27/2021   TRIG 166.0 (H) 10/27/2021      Wt Readings from Last 3 Encounters:  10/06/22 123 lb 4 oz (55.9 kg)  05/05/22 124 lb 6.4 oz (56.4 kg)  02/18/22 123 lb (55.8 kg)      ASSESSMENT AND PLAN:  Mixed hyperlipidemia -  Unable to tolerate Crestor, simvastatin Tolerating Zetia Given underlying coronary disease, PAD/aortic atherosclerosis, LDL above goal, prescription sent in for Repatha 140 subcu  every 2 weeks  Coronary disease with stable angina Calcium score 100, denies anginal symptoms Cholesterol management as above  Essential hypertension - Monitor at home, Blood pressure high end of range, no changes made to medications Well-controlled with primary care September 2023  APCs, PVCs Denies significant symptoms  PAD, aortic atherosclerosis Mild aortic athero noted on CT scan 2018 Cholesterol management as above   Total encounter time more than 30 minutes  Greater than 50% was spent in counseling and coordination of care with the patient    No orders of the defined types were placed in this encounter.    Signed, Esmond Plants, M.D., Ph.D. 10/06/2022  [place], [place]

## 2022-10-06 ENCOUNTER — Encounter: Payer: Self-pay | Admitting: Cardiovascular Disease

## 2022-10-06 ENCOUNTER — Ambulatory Visit: Payer: PPO | Attending: Cardiovascular Disease | Admitting: Cardiovascular Disease

## 2022-10-06 VITALS — BP 144/80 | HR 73 | Ht 60.0 in | Wt 123.2 lb

## 2022-10-06 DIAGNOSIS — I2510 Atherosclerotic heart disease of native coronary artery without angina pectoris: Secondary | ICD-10-CM

## 2022-10-06 DIAGNOSIS — I2584 Coronary atherosclerosis due to calcified coronary lesion: Secondary | ICD-10-CM | POA: Diagnosis not present

## 2022-10-06 DIAGNOSIS — I10 Essential (primary) hypertension: Secondary | ICD-10-CM

## 2022-10-06 DIAGNOSIS — F419 Anxiety disorder, unspecified: Secondary | ICD-10-CM | POA: Diagnosis not present

## 2022-10-06 DIAGNOSIS — E785 Hyperlipidemia, unspecified: Secondary | ICD-10-CM

## 2022-10-06 MED ORDER — REPATHA SURECLICK 140 MG/ML ~~LOC~~ SOAJ: 140.0000 mg | SUBCUTANEOUS | 3 refills | Status: DC

## 2022-10-06 NOTE — Patient Instructions (Addendum)
Medication Instructions:  Repatha 140 sq every 2 weeks Stay on zetia for now  If you need a refill on your cardiac medications before your next appointment, please call your pharmacy.   Lab work: No new labs needed  Testing/Procedures: No new testing needed  Follow-Up: At [facility], you and your health needs are our priority.  As part of our continuing mission to provide you with exceptional heart care, we have created designated Provider Care Teams.  These Care Teams include your primary Cardiologist (physician) and Advanced Practice Providers (APPs -  Physician Assistants and Nurse Practitioners) who all work together to provide you with the care you need, when you need it.  You will need a follow up appointment in 6 months  Providers on your designated Care Team:   Murray Hodgkins, NP Christell Faith, PA-C Cadence Kathlen Mody, [place]  COVID-19 Vaccine Information can be found at: [email or web address].uk For questions related to vaccine distribution or appointments, please email [email or web address] or call [phone].

## 2022-10-07 ENCOUNTER — Telehealth: Payer: Self-pay

## 2022-10-07 NOTE — Telephone Encounter (Signed)
Prior Authorization initiated by [email or web address]   KEY: [id number]   Response: [employer] has not yet replied to your PA request. If [employer] has not replied to your request within 24 hours for expedited requests and 72 hours for standard requests please contact [employer] at [phone] .

## 2022-10-08 NOTE — Telephone Encounter (Signed)
Per Fax received from [place] has been approved from 10-08-22 though 10-09-23.

## 2022-10-27 DIAGNOSIS — H40003 Preglaucoma, unspecified, bilateral: Secondary | ICD-10-CM | POA: Diagnosis not present

## 2022-10-27 DIAGNOSIS — H2513 Age-related nuclear cataract, bilateral: Secondary | ICD-10-CM | POA: Diagnosis not present

## 2022-10-27 DIAGNOSIS — H43813 Vitreous degeneration, bilateral: Secondary | ICD-10-CM | POA: Diagnosis not present

## 2022-10-30 ENCOUNTER — Encounter: Payer: PPO | Admitting: Family

## 2022-11-02 ENCOUNTER — Ambulatory Visit (INDEPENDENT_AMBULATORY_CARE_PROVIDER_SITE_OTHER): Payer: PPO | Admitting: Family

## 2022-11-02 ENCOUNTER — Encounter: Payer: Self-pay | Admitting: Family

## 2022-11-02 VITALS — BP 136/76 | HR 82 | Temp 97.6°F | Ht 63.0 in | Wt 122.8 lb

## 2022-11-02 DIAGNOSIS — I2584 Coronary atherosclerosis due to calcified coronary lesion: Secondary | ICD-10-CM | POA: Diagnosis not present

## 2022-11-02 DIAGNOSIS — E210 Primary hyperparathyroidism: Secondary | ICD-10-CM | POA: Diagnosis not present

## 2022-11-02 DIAGNOSIS — I2510 Atherosclerotic heart disease of native coronary artery without angina pectoris: Secondary | ICD-10-CM

## 2022-11-02 DIAGNOSIS — M8580 Other specified disorders of bone density and structure, unspecified site: Secondary | ICD-10-CM | POA: Insufficient documentation

## 2022-11-02 DIAGNOSIS — I10 Essential (primary) hypertension: Secondary | ICD-10-CM | POA: Diagnosis not present

## 2022-11-02 DIAGNOSIS — R7303 Prediabetes: Secondary | ICD-10-CM | POA: Diagnosis not present

## 2022-11-02 DIAGNOSIS — Z0000 Encounter for general adult medical examination without abnormal findings: Secondary | ICD-10-CM | POA: Diagnosis not present

## 2022-11-02 LAB — COMPREHENSIVE METABOLIC PANEL
ALT: 24 U/L (ref 0–35)
AST: 25 U/L (ref 0–37)
Albumin: 4.3 g/dL (ref 3.5–5.2)
Alkaline Phosphatase: 72 U/L (ref 39–117)
BUN: 17 mg/dL (ref 6–23)
CO2: 29 mEq/L (ref 19–32)
Calcium: 9.8 mg/dL (ref 8.4–10.5)
Chloride: 101 mEq/L (ref 96–112)
Creatinine, Ser: 0.73 mg/dL (ref 0.40–1.20)
GFR: 78.01 mL/min (ref >60.00)
Glucose, Bld: 97 mg/dL (ref 70–99)
Potassium: 4.2 mEq/L (ref 3.5–5.1)
Sodium: 137 mEq/L (ref 135–145)
Total Bilirubin: 0.7 mg/dL (ref 0.2–1.2)
Total Protein: 7.3 g/dL (ref 6.0–8.3)

## 2022-11-02 LAB — TSH: TSH: 2.86 u[IU]/mL (ref 0.35–5.50)

## 2022-11-02 LAB — VITAMIN D 25 HYDROXY (VIT D DEFICIENCY, FRACTURES): VITD: 26.03 ng/mL — ABNORMAL LOW (ref 30.00–100.00)

## 2022-11-02 LAB — LIPID PANEL
Cholesterol: 237 mg/dL — ABNORMAL HIGH (ref 0–200)
HDL: 39.3 mg/dL (ref >39.00)
NonHDL: 197.74
Total CHOL/HDL Ratio: 6
Triglycerides: 224 mg/dL — ABNORMAL HIGH (ref 0.0–149.0)
VLDL: 44.8 mg/dL — ABNORMAL HIGH (ref 0.0–40.0)

## 2022-11-02 LAB — LDL CHOLESTEROL, DIRECT: Direct LDL: 164 mg/dL

## 2022-11-02 LAB — HEMOGLOBIN A1C: Hgb A1c MFr Bld: 5.8 % (ref 4.6–6.5)

## 2022-11-02 NOTE — Assessment & Plan Note (Signed)
Clinical breast exam performed today.  Deferred pelvic exam the absence complaints and patient is no longer screening for cervical cancer.  Encouraged continued exercise.

## 2022-11-02 NOTE — Assessment & Plan Note (Signed)
Previously followed with Dr Gabriel Carina, last visit 07/2021 Pending PTH, calcium, vitamin D.  Patient will schedule bone density 06/2023

## 2022-11-02 NOTE — Progress Notes (Signed)
Assessment & Plan:  Routine general medical examination at a health care facility Assessment & Plan: Clinical breast exam performed today.  Deferred pelvic exam the absence complaints and patient is no longer screening for cervical cancer.  Encouraged continued exercise.   Coronary artery calcification -     Lipid panel -     Comprehensive metabolic panel  Prediabetes -     Hemoglobin A1c  Primary hyperparathyroidism ([place]) Assessment & Plan: Previously followed with Dr Gabriel Carina, last visit 07/2021 Pending PTH, calcium, vitamin D.  Patient will schedule bone density 06/2023  Orders: -     VITAMIN D 25 Hydroxy (Vit-D Deficiency, Fractures) -     PTH, intact and calcium  Osteopenia, unspecified location -     VITAMIN D 25 Hydroxy (Vit-D Deficiency, Fractures) -     DG Bone Density; Future  Primary hypertension -     TSH -     Comprehensive metabolic panel     Return precautions given.   Risks, benefits, and alternatives of the medications and treatment plan prescribed today were discussed, and patient expressed understanding.   Education regarding symptom management and diagnosis given to patient on AVS either electronically or printed.  Return in about 1 year (around 11/02/2023) for Complete Physical Exam.  Mable Paris, FNP  Subjective:    Patient ID: Melissa Turner, female    DOB: Aug 26, 1940, 82 y.o.   MRN: [id number]  CC: Melissa Turner is a 82 y.o. female who presents today for physical exam.    HPI: Feels well today.  No new complaints     S/p right sided parathyroidectomy 03/2020 Dr Adonis Huguenin   Using OTC general vitamin.   Recently started on Repatha by Dr. Rockey Situ.  Intolerant to Crestor, simvastatin.  She is compliant with zetia  Colorectal Cancer Screening: Colonoscopy 02/18/2022, Dr. Alice Reichert.  Per colonoscopy note, no further colonoscopy if polyps were not precancerous. Breast Cancer Screening: Mammogram UTD Cervical Cancer Screening: No longer  screening for cervical cancer.h/o partial hysterectomy; due to bladder prolapse. has bilateral ovaries.   No pelvic pain.   Bone Health screening/DEXA for 65+: History of hyperparathyroidism.  She is following with endocrine, Dr Gabriel Carina.  Bone density will be due 06/2023  Lung Cancer Screening: Doesn't have 20 year pack year history and age > 59 years yo 58 years        Exercise: Gets regular exercise, walking and weightlifting Alcohol use: Occasional Smoking/tobacco use: Nonsmoker.    Health Maintenance  Topic Date Due   Medicare Annual Wellness (AWV)  12/16/2022   COVID-19 Vaccine (1) 11/11/2022 (Originally 01/17/1946)   INFLUENZA VACCINE  11/22/2022 (Originally 03/24/2022)   Zoster Vaccines- Shingrix (1 of 2) 02/02/2023 (Originally 01/16/1960)   COLONOSCOPY (Pts 45-66yr Insurance coverage will need to be confirmed)  02/19/2027   DTaP/Tdap/Td (2 - Td or Tdap) 03/12/2027   Pneumonia Vaccine 80 Years old  Completed   DEXA SCAN  Completed   Hepatitis C Screening  Completed   HPV VACCINES  Aged Out    ALLERGIES: Patient has no known allergies.  Current Outpatient Medications on File Prior to Visit  Medication Sig Dispense Refill   ALPRAZolam (XANAX) 0.25 MG tablet TAKE 1 TABLET(0.25 MG) BY MOUTH DAILY AS NEEDED FOR ANXIETY 30 tablet 2   aspirin EC 81 MG tablet Take 325 mg by mouth daily. Swallow whole.     Evolocumab (REPATHA SURECLICK) 1XX123456MG/ML SOAJ Inject 140 mg into the skin every 14 (fourteen) days. 6 mL  3   ezetimibe (ZETIA) 10 MG tablet Take 1 tablet (10 mg total) by mouth daily. 90 tablet 3   metoprolol succinate (TOPROL-XL) 25 MG 24 hr tablet Take 1.5 tablets (37.5 mg total) by mouth daily. 135 tablet 3   Multiple Vitamins-Minerals (MULTIVITAMIN WITH MINERALS) tablet Take 1 tablet by mouth daily.     No current facility-administered medications on file prior to visit.    Review of Systems  Constitutional:  Negative for chills, fever and unexpected weight change.  HENT:   Negative for congestion.   Respiratory:  Negative for cough.   Cardiovascular:  Negative for chest pain, palpitations and leg swelling.  Gastrointestinal:  Negative for nausea and vomiting.  Genitourinary:  Negative for pelvic pain.  Musculoskeletal:  Negative for arthralgias and myalgias.  Skin:  Negative for rash.  Neurological:  Negative for headaches.  Hematological:  Negative for adenopathy.  Psychiatric/Behavioral:  Negative for confusion.       Objective:    BP 136/76   Pulse 82   Temp 97.6 F (36.4 C) (Oral)   Ht '5\' 3"'$  (1.6 m)   Wt 122 lb 12.8 oz (55.7 kg)   SpO2 99%   BMI 21.75 kg/m   BP Readings from Last 3 Encounters:  11/02/22 136/76  10/06/22 (!) 144/80  05/05/22 132/82   Wt Readings from Last 3 Encounters:  11/02/22 122 lb 12.8 oz (55.7 kg)  10/06/22 123 lb 4 oz (55.9 kg)  05/05/22 124 lb 6.4 oz (56.4 kg)    Physical Exam Vitals reviewed.  Constitutional:      Appearance: Normal appearance. She is well-developed.  Eyes:     Conjunctiva/sclera: Conjunctivae normal.  Neck:     Thyroid: No thyroid mass or thyromegaly.  Cardiovascular:     Rate and Rhythm: Normal rate and regular rhythm.     Pulses: Normal pulses.     Heart sounds: Normal heart sounds.  Pulmonary:     Effort: Pulmonary effort is normal.     Breath sounds: Normal breath sounds. No wheezing, rhonchi or rales.  Chest:  Breasts:    Breasts are symmetrical.     Right: No inverted nipple, mass, nipple discharge, skin change or tenderness.     Left: No inverted nipple, mass, nipple discharge, skin change or tenderness.  Abdominal:     General: Bowel sounds are normal. There is no distension.     Palpations: Abdomen is soft. Abdomen is not rigid. There is no fluid wave or mass.     Tenderness: There is no abdominal tenderness. There is no guarding or rebound.  Lymphadenopathy:     Head:     Right side of head: No submental, submandibular, tonsillar, preauricular, posterior auricular or  occipital adenopathy.     Left side of head: No submental, submandibular, tonsillar, preauricular, posterior auricular or occipital adenopathy.     Cervical: No cervical adenopathy.     Right cervical: No superficial, deep or posterior cervical adenopathy.    Left cervical: No superficial, deep or posterior cervical adenopathy.  Skin:    General: Skin is warm and dry.  Neurological:     Mental Status: She is alert.  Psychiatric:        Speech: Speech normal.        Behavior: Behavior normal.        Thought Content: Thought content normal.

## 2022-11-03 ENCOUNTER — Ambulatory Visit (INDEPENDENT_AMBULATORY_CARE_PROVIDER_SITE_OTHER): Payer: PPO | Admitting: Dermatology

## 2022-11-03 VITALS — BP 155/89

## 2022-11-03 DIAGNOSIS — L603 Nail dystrophy: Secondary | ICD-10-CM

## 2022-11-03 DIAGNOSIS — L988 Other specified disorders of the skin and subcutaneous tissue: Secondary | ICD-10-CM

## 2022-11-03 LAB — PTH, INTACT AND CALCIUM
Calcium: 9.7 mg/dL (ref 8.6–10.4)
PTH: 26 pg/mL (ref 16–77)

## 2022-11-03 NOTE — Patient Instructions (Signed)
Due to recent changes in healthcare laws, you may see results of your pathology and/or laboratory studies on MyChart before the doctors have had a chance to review them. We understand that in some cases there may be results that are confusing or concerning to you. Please understand that not all results are received at the same time and often the doctors may need to interpret multiple results in order to provide you with the best plan of care or course of treatment. Therefore, we ask that you please give us 2 business days to thoroughly review all your results before contacting the office for clarification. Should we see a critical lab result, you will be contacted sooner.   If You Need Anything After Your Visit  If you have any questions or concerns for your doctor, please call our main line at [phone] and press option 4 to reach your doctor's medical assistant. If no one answers, please leave a voicemail as directed and we will return your call as soon as possible. Messages left after 4 pm will be answered the following business day.   You may also send us a message via MyChart. We typically respond to MyChart messages within 1-2 business days.  For prescription refills, please ask your pharmacy to contact our office. Our fax number is [phone].  If you have an urgent issue when the clinic is closed that cannot wait until the next business day, you can page your doctor at the number below.    Please note that while we do our best to be available for urgent issues outside of office hours, we are not available 24/7.   If you have an urgent issue and are unable to reach us, you may choose to seek medical care at your doctor's office, retail clinic, urgent care center, or emergency room.  If you have a medical emergency, please immediately call 911 or go to the emergency department.  Pager Numbers  - Dr. Kowalski: [phone]  - Dr. Moye: [phone]  - Dr. Stewart:  [phone]  In the event of inclement weather, please call our main line at [phone] for an update on the status of any delays or closures.  Dermatology Medication Tips: Please keep the boxes that topical medications come in in order to help keep track of the instructions about where and how to use these. Pharmacies typically print the medication instructions only on the boxes and not directly on the medication tubes.   If your medication is too expensive, please contact our office at [phone] option 4 or send us a message through MyChart.   We are unable to tell what your co-pay for medications will be in advance as this is different depending on your insurance coverage. However, we may be able to find a substitute medication at lower cost or fill out paperwork to get insurance to cover a needed medication.   If a prior authorization is required to get your medication covered by your insurance company, please allow us 1-2 business days to complete this process.  Drug prices often vary depending on where the prescription is filled and some pharmacies may offer cheaper prices.  The website www.goodrx.com contains coupons for medications through different pharmacies. The prices here do not account for what the cost may be with help from insurance (it may be cheaper with your insurance), but the website can give you the price if you did not use any insurance.  - You can print the associated coupon and take it with   your prescription to the pharmacy.  - You may also stop by our office during regular business hours and pick up a GoodRx coupon card.  - If you need your prescription sent electronically to a different pharmacy, notify our office through [facility] MyChart or by phone at [phone] option 4.     Si Usted Necesita Algo Despus de Su Visita  Tambin puede enviarnos un mensaje a travs de MyChart. Por lo general respondemos a los mensajes de MyChart en el transcurso de 1 a 2  das hbiles.  Para renovar recetas, por favor pida a su farmacia que se ponga en contacto con nuestra oficina. Nuestro nmero de fax es el [phone].  Si tiene un asunto urgente cuando la clnica est cerrada y que no puede esperar hasta el siguiente da hbil, puede llamar/localizar a su doctor(a) al nmero que aparece a continuacin.   Por favor, tenga en cuenta que aunque hacemos todo lo posible para estar disponibles para asuntos urgentes fuera del horario de oficina, no estamos disponibles las 24 horas del da, los 7 das de la semana.   Si tiene un problema urgente y no puede comunicarse con nosotros, puede optar por buscar atencin mdica  en el consultorio de su doctor(a), en una clnica privada, en un centro de atencin urgente o en una sala de emergencias.  Si tiene una emergencia mdica, por favor llame inmediatamente al 911 o vaya a la sala de emergencias.  Nmeros de bper  - Dr. Kowalski: [phone]  - Dra. Moye: [phone]  - Dra. Stewart: [phone]  En caso de inclemencias del tiempo, por favor llame a nuestra lnea principal al [phone] para una actualizacin sobre el estado de cualquier retraso o cierre.  Consejos para la medicacin en dermatologa: Por favor, guarde las cajas en las que vienen los medicamentos de uso tpico para ayudarle a seguir las instrucciones sobre dnde y cmo usarlos. Las farmacias generalmente imprimen las instrucciones del medicamento slo en las cajas y no directamente en los tubos del medicamento.   Si su medicamento es muy caro, por favor, pngase en contacto con nuestra oficina llamando al [phone] y presione la opcin 4 o envenos un mensaje a travs de MyChart.   No podemos decirle cul ser su copago por los medicamentos por adelantado ya que esto es diferente dependiendo de la cobertura de su seguro. Sin embargo, es posible que podamos encontrar un medicamento sustituto a menor costo o llenar un formulario para que el  seguro cubra el medicamento que se considera necesario.   Si se requiere una autorizacin previa para que su compaa de seguros cubra su medicamento, por favor permtanos de 1 a 2 das hbiles para completar este proceso.  Los precios de los medicamentos varan con frecuencia dependiendo del lugar de dnde se surte la receta y alguna farmacias pueden ofrecer precios ms baratos.  El sitio web www.goodrx.com tiene cupones para medicamentos de diferentes farmacias. Los precios aqu no tienen en cuenta lo que podra costar con la ayuda del seguro (puede ser ms barato con su seguro), pero el sitio web puede darle el precio si no utiliz ningn seguro.  - Puede imprimir el cupn correspondiente y llevarlo con su receta a la farmacia.  - Tambin puede pasar por nuestra oficina durante el horario de atencin regular y recoger una tarjeta de cupones de GoodRx.  - Si necesita que su receta se enve electrnicamente a una farmacia diferente, informe a nuestra oficina a travs de MyChart de [place]   o por telfono llamando al [phone] y presione la opcin 4.  

## 2022-11-03 NOTE — Progress Notes (Signed)
   Follow-Up Visit   Subjective  Melissa Turner is a 82 y.o. 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  The following portions of the chart were reviewed this encounter and updated as appropriate:   Tobacco  Allergies  Meds  Problems  Med Hx  Surg Hx  Fam Hx     Review of Systems:  No other skin or systemic complaints except as noted in HPI or Assessment and Plan.  Objective  Well appearing patient in no apparent distress; mood and affect are within normal limits.  A focused examination was performed including upper extremities, including the arms, hands, fingers, and fingernails. Relevant physical exam findings are noted in the Assessment and Plan.  Face Rhytides and volume loss.      Left thumb prox nailfold area 0.5 cm area of nail damage       Assessment & Plan  Elastosis of skin Face  Botox today - 40 units total  Frown complex - 20 units Crow's Feet - 10 units each side   Botox Injection - Face Location: See attached image  Informed consent: Discussed risks (infection, pain, bleeding, bruising, swelling, allergic reaction, paralysis of nearby muscles, eyelid droop, double vision, neck weakness, difficulty breathing, headache, undesirable cosmetic result, and need for additional treatment) and benefits of the procedure, as well as the alternatives.  Informed consent was obtained.  Preparation: The area was cleansed with alcohol.  Procedure Details:  Botox was injected into the dermis with a 30-gauge needle. Pressure applied to any bleeding. Ice packs offered for swelling.  Lot Number:  [id number] Expiration:  10/2024  Total Units Injected:  40  Plan: Patient was instructed to remain upright for 4 hours. Patient was instructed to avoid massaging the face and avoid vigorous exercise for the rest of the day. Tylenol may be used for headache.  Allow 2 weeks  before returning to clinic for additional dosing as needed. Patient will call for any problems.   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  I, Ashok Cordia, CMA, am acting as scribe for Sarina Ser, MD . Documentation: I have reviewed the above documentation for accuracy and completeness, and I agree with the above.  Sarina Ser, MD

## 2022-11-05 ENCOUNTER — Encounter: Payer: Self-pay | Admitting: Dermatology

## 2022-11-11 ENCOUNTER — Telehealth: Payer: Self-pay

## 2022-11-11 NOTE — Telephone Encounter (Signed)
LVM to call back to go over results and schedule recheck of vit d in 4 mnths

## 2022-11-12 NOTE — Telephone Encounter (Signed)
Pt returning call for results and vitamin D appointment is scheduled for 7/22

## 2022-11-12 NOTE — Telephone Encounter (Signed)
Returned pt call but had to lvm to call back to office

## 2022-11-16 ENCOUNTER — Telehealth: Payer: Self-pay | Admitting: Family

## 2022-11-16 NOTE — Telephone Encounter (Signed)
NOTED

## 2022-11-16 NOTE — Telephone Encounter (Signed)
Patient called and stated she had a bone density on 07/23/2022 and does not need one at this time.

## 2022-11-17 NOTE — Telephone Encounter (Signed)
LVM to call back to office  

## 2022-11-18 NOTE — Telephone Encounter (Signed)
Pt returned [place] call. Unable to transfer.

## 2022-11-18 NOTE — Telephone Encounter (Signed)
Spoke to pt and asked her about her bone density scan and she stated that she had it done at Dr Francoise Schaumann office per Dr Paulla Fore.

## 2022-11-18 NOTE — Telephone Encounter (Signed)
SEE previous note spoke to pt

## 2022-11-18 NOTE — Telephone Encounter (Signed)
Noted Will discuss with pt repeat DEXA at follow up

## 2022-12-09 ENCOUNTER — Telehealth: Payer: Self-pay | Admitting: Family

## 2022-12-09 NOTE — Telephone Encounter (Signed)
Copied from CRM [id number]. Topic: Medicare AWV >> Dec 09, 2022  3:12 PM [name] wrote: Reason for CRM: Called patient to schedule Medicare Annual Wellness Visit (AWV). Left message for patient to call back and schedule Medicare Annual Wellness Visit (AWV).  Last date of AWV: 12/15/2021  Please schedule an AWVS appointment at any time with [facility] VISIT.  If any questions, please contact me at [phone].    Thank you,  [facility] Direct dial  [phone]

## 2022-12-30 ENCOUNTER — Telehealth: Payer: Self-pay | Admitting: Family

## 2022-12-30 NOTE — Telephone Encounter (Signed)
Contacted Malorie Doty Heslin to schedule their annual wellness visit. Appointment made for 01/13/2023.  Verlee Rossetti; Care Guide Ambulatory Clinical Support [facility] Direct Dial: [phone]

## 2023-01-12 NOTE — Progress Notes (Unsigned)
I connected with  Melissa Turner on 01/13/2023 by a audio enabled telemedicine application and verified that I am speaking with the correct person using two identifiers.  Patient Location: Home  Provider Location: Home Office  I discussed the limitations of evaluation and management by telemedicine. The patient expressed understanding and agreed to proceed.   Subjective:   Melissa Turner is a 82 y.o. female who presents for Medicare Annual (Subsequent) preventive examination.  Review of Systems    Per HPI unless specifically indicated below.  Cardiac Risk Factors include: advanced age (>63men, >74 women);female gender, Hypertension.          Objective:       11/03/2022    9:48 AM 11/02/2022    9:39 AM 10/06/2022   11:37 AM  Vitals with BMI  Height  5\' 3"  5\' 0"   Weight  122 lbs 13 oz 123 lbs 4 oz  BMI  21.76 24.07  Systolic 155 136 161  Diastolic 89 76 80  Pulse  82 73    Today's Vitals   01/13/23 0842  PainSc: 7    There is no height or weight on file to calculate BMI.     01/13/2023    8:48 AM 02/18/2022   10:37 AM 12/15/2021   10:37 AM 12/13/2020   10:34 AM 10/05/2018    5:50 PM 09/30/2016    2:01 PM 06/25/2016    9:25 AM  Advanced Directives  Does Patient Have a Medical Advance Directive? Yes Yes Yes Yes No Yes Yes  Type of Estate agent of [place];Living will Living will Healthcare Power of [place];Living will Healthcare Power of [place];Living will  Living will Healthcare Power of [place];Living will  Does patient want to make changes to medical advance directive? No - Patient declined  No - Patient declined No - Patient declined     Copy of Healthcare Power of Attorney in Chart?   No - copy requested No - copy requested   No - copy requested  Would patient like information on creating a medical advance directive?     No - Patient declined      Current Medications (verified) Outpatient Encounter Medications as of 01/13/2023   Medication Sig   ALPRAZolam (XANAX) 0.25 MG tablet TAKE 1 TABLET(0.25 MG) BY MOUTH DAILY AS NEEDED FOR ANXIETY   aspirin EC 81 MG tablet Take 325 mg by mouth daily. Swallow whole.   Evolocumab (REPATHA SURECLICK) 140 MG/ML SOAJ Inject 140 mg into the skin every 14 (fourteen) days.   metoprolol succinate (TOPROL-XL) 25 MG 24 hr tablet Take 1.5 tablets (37.5 mg total) by mouth daily.   Multiple Vitamins-Minerals (MULTIVITAMIN WITH MINERALS) tablet Take 1 tablet by mouth daily.   ezetimibe (ZETIA) 10 MG tablet Take 1 tablet (10 mg total) by mouth daily. (Patient not taking: Reported on 01/13/2023)   No facility-administered encounter medications on file as of 01/13/2023.    Allergies (verified) Patient has no known allergies.   History: Past Medical History:  Diagnosis Date   Deviated septum    s/p rhinoplasty   GERD (gastroesophageal reflux disease)    History of blood transfusion    during first child's birth   History of chicken pox    HTN (hypertension)    Hypercholesteremia    Hyperlipidemia    Migraines    Primary hyperparathyroidism (HCC)    Past Surgical History:  Procedure Laterality Date   ABDOMINAL HYSTERECTOMY  11/2017   partial hysterectomy; due  to bladder prolapse. has bilateral ovaries.    BREAST CYST ASPIRATION Left    BUNIONECTOMY     COLONOSCOPY     07/12/2006, 11/12/2016   COLONOSCOPY N/A 02/18/2022   Procedure: COLONOSCOPY;  Surgeon: Toledo, Boykin Nearing, MD;  Location: [facility] ENDOSCOPY;  Service: Gastroenterology;  Laterality: N/A;   DILATION AND CURETTAGE OF UTERUS     ear lobe revision     ESOPHAGOGASTRODUODENOSCOPY N/A 02/18/2022   Procedure: ESOPHAGOGASTRODUODENOSCOPY (EGD);  Surgeon: Toledo, Boykin Nearing, MD;  Location: [facility] ENDOSCOPY;  Service: Gastroenterology;  Laterality: N/A;   NOSE SURGERY  1970   PARATHYROIDECTOMY  03/29/2020   RHINOPLASTY     THYROID SURGERY  03/2020   parathyroidectomy   TONSILLECTOMY  1950   VAGINAL DELIVERY     x2   Family  History  Problem Relation Age of Onset   Heart disease Mother    Diabetes Father    Heart failure Father    Dementia Father    Cancer Other        Breast, liver, lung   Dementia Paternal Grandfather    Heart disease Maternal Grandfather    Heart attack Maternal Grandfather    Stroke Paternal Grandmother    Diabetes Daughter    Asthma Daughter    Breast cancer Neg Hx    Social History   Socioeconomic History   Marital status: Married    Spouse name: Not on file   Number of children: 2   Years of education: Not on file   Highest education level: Not on file  Occupational History   Occupation: [employer]   Occupation: Retired  Tobacco Use   Smoking status: Never   Smokeless tobacco: Never  Vaping Use   Vaping Use: Never used  Substance and Sexual Activity   Alcohol use: Yes    Alcohol/week: 1.0 standard drink of alcohol    Types: 1 Glasses of wine per week    Comment: 1 glass a wine a week   Drug use: No   Sexual activity: Not Currently  Other Topics Concern   Not on file  Social History Narrative   Lives with husband and 2 grandchildren live with her- 5 total grandchildren. Works at [employer] in [employer], retired 2020      Regular Exercise -  Aerobic 30 min every day, walking   Daily Caffeine Use:  1 cup sw tea daily         Social Determinants of Health   Financial Resource Strain: Low Risk  (01/13/2023)   Overall Financial Resource Strain (CARDIA)    Difficulty of Paying Living Expenses: Not hard at all  Food Insecurity: No Food Insecurity (01/13/2023)   Hunger Vital Sign    Worried About Running Out of Food in the Last Year: Never true    Ran Out of Food in the Last Year: Never true  Transportation Needs: No Transportation Needs (01/13/2023)   PRAPARE - Administrator, Civil Service (Medical): No    Lack of Transportation (Non-Medical): No  Physical Activity: Insufficiently Active (01/13/2023)    Exercise Vital Sign    Days of Exercise per Week: 3 days    Minutes of Exercise per Session: 30 min  Stress: No Stress Concern Present (01/13/2023)   [employer] of Occupational Health - Occupational Stress Questionnaire    Feeling of Stress : Not at all  Social Connections: Socially Integrated (01/13/2023)   Social Connection and Isolation Panel [NHANES]  Frequency of Communication with Friends and Family: More than three times a week    Frequency of Social Gatherings with Friends and Family: More than three times a week    Attends Religious Services: More than 4 times per year    Active Member of [employer] or Organizations: Yes    Attends Banker Meetings: Never    Marital Status: Married    Tobacco Counseling Counseling given: No   Clinical Intake:  Pre-visit preparation completed: No  Pain : 0-10 Pain Score: 7  Pain Type: Chronic pain Pain Location: Knee Pain Orientation: Right, Left Pain Descriptors / Indicators: Aching Pain Onset: More than a month ago Pain Frequency: Constant     Nutritional Status: BMI of 19-24  Normal Diabetes: No  How often do you need to have someone help you when you read instructions, pamphlets, or other written materials from your doctor or pharmacy?: 1 - Never  Diabetic?No  Interpreter Needed?: No  Information entered by :: Laurel Dimmer, CMA   Activities of Daily Living    01/13/2023    8:39 AM  In your present state of health, do you have any difficulty performing the following activities:  Hearing? 0  Vision? 1  Comment [facility]  Difficulty concentrating or making decisions? 0  Walking or climbing stairs? 1  Dressing or bathing? 0  Doing errands, shopping? 0    Patient Care Team: Allegra Grana, FNP as PCP - General (Family Medicine)  Indicate any recent Medical Services you may have received from other than [facility] providers in the past year (date may be approximate).     Assessment:    This is a routine wellness examination for Melissa Turner.  Hearing/Vision screen Denies any hearing issues. Denies any change to her vision. Annual Eye Exam.   Dietary issues and exercise activities discussed: Current Exercise Habits: Structured exercise class, Type of exercise: strength training/weights;walking, Time (Minutes): 30, Frequency (Times/Week): 3, Weekly Exercise (Minutes/Week): 90, Intensity: Moderate, Exercise limited by: orthopedic condition(s)   Goals Addressed   None    Depression Screen    01/13/2023    8:38 AM 11/02/2022    9:39 AM 05/05/2022   10:35 AM 12/15/2021   10:39 AM 10/27/2021    9:05 AM 12/13/2020   10:27 AM 07/30/2020   10:16 AM  PHQ 2/9 Scores  PHQ - 2 Score 0 0 0 0 0 0 0  PHQ- 9 Score  0  0 0  2    Fall Risk    01/13/2023    8:39 AM 11/02/2022    9:39 AM 05/05/2022   10:35 AM 12/15/2021   10:38 AM 10/27/2021    9:04 AM  Fall Risk   Falls in the past year? 0 0 0 0 0  Number falls in past yr: 0 0 0 0 0  Injury with Fall? 0 0 0  0  Risk for fall due to : No Fall Risks No Fall Risks No Fall Risks  No Fall Risks  Follow up Falls evaluation completed Falls evaluation completed Falls evaluation completed Falls evaluation completed Falls evaluation completed    FALL RISK PREVENTION PERTAINING TO THE HOME:  Any stairs in or around the home? Yes  If so, are there any without handrails? No  Home free of loose throw rugs in walkways, pet beds, electrical cords, etc? No Adequate lighting in your home to reduce risk of falls? Yes   ASSISTIVE DEVICES UTILIZED TO PREVENT FALLS:  Life alert? No  Use of a cane, walker or w/c? No  Grab bars in the bathroom? Yes  Shower chair or bench in shower? Yes  Elevated toilet seat or a handicapped toilet? Yes   TIMED UP AND GO:  Was the test performed?Unable to perform, virtual appointment   Cognitive Function:    06/25/2016    9:34 AM  MMSE - Mini Mental State Exam  Orientation to time 5  Orientation to Place 5   Registration 3  Attention/ Calculation 5  Recall 3  Language- name 2 objects 2  Language- repeat 1  Language- follow 3 step command 3  Language- read & follow direction 1  Write a sentence 1  Copy design 1  Total score 30        01/13/2023    8:44 AM  6CIT Screen  What Year? 0 points  What month? 0 points  What time? 0 points  Count back from 20 0 points  Months in reverse 0 points  Repeat phrase 0 points  Total Score 0 points    Immunizations Immunization History  Administered Date(s) Administered   Influenza Split 06/25/2011, 06/13/2012   Influenza,inj,Quad PF,6+ Mos 09/05/2015, 06/03/2018   Influenza-Unspecified 06/08/2013, 06/26/2014, 06/07/2016   Pneumococcal Conjugate-13 12/15/2013   Pneumococcal Polysaccharide-23 12/12/2012   Tdap 03/11/2017    TDAP status: Up to date  Flu Vaccine status: Up to date  Pneumococcal vaccine status: Up to date  Covid-19 vaccine status: Information provided on how to obtain vaccines.   Qualifies for Shingles Vaccine? Yes   Zostavax completed No   Shingrix Completed?: No.    Education has been provided regarding the importance of this vaccine. Patient has been advised to call insurance company to determine out of pocket expense if they have not yet received this vaccine. Advised may also receive vaccine at local pharmacy or Health Dept. Verbalized acceptance and understanding.  Screening Tests Health Maintenance  Topic Date Due   COVID-19 Vaccine (1) Never done   Zoster Vaccines- Shingrix (1 of 2) 02/02/2023 (Originally 01/16/1960)   INFLUENZA VACCINE  03/25/2023   Medicare Annual Wellness (AWV)  01/13/2024   COLONOSCOPY (Pts 45-69yrs Insurance coverage will need to be confirmed)  02/19/2027   DTaP/Tdap/Td (2 - Td or Tdap) 03/12/2027   Pneumonia Vaccine 39+ Years old  Completed   DEXA SCAN  Completed   Hepatitis C Screening  Completed   HPV VACCINES  Aged Out    Health Maintenance  Health Maintenance Due  Topic Date  Due   COVID-19 Vaccine (1) Never done    Colorectal cancer screening: Type of screening: Colonoscopy. Completed 02/18/2022. Repeat every 5 years  Mammogram status: No longer required due to age.  DEXA Scan: 01/23/2020, ordered to repeat 11/02/22  Lung Cancer Screening: (Low Dose CT Chest recommended if Age 52-80 years, 30 pack-year currently smoking OR have quit w/in 15years.) does not qualify.   Lung Cancer Screening Referral: not applicable   Additional Screening:  Hepatitis C Screening: does qualify; Completed 09/05/2015  Vision Screening: Recommended annual ophthalmology exams for early detection of glaucoma and other disorders of the eye. Is the patient up to date with their annual eye exam?  Yes  Who is the provider or what is the name of the office in which the patient attends annual eye exams? [facility]  If pt is not established with a provider, would they like to be referred to a provider to establish care? No .   Dental Screening: Recommended annual dental exams  for proper oral hygiene  Community Resource Referral / Chronic Care Management: CRR required this visit?  No   CCM required this visit?  No      Plan:     I have personally reviewed and noted the following in the patient's chart:   Medical and social history Use of alcohol, tobacco or illicit drugs  Current medications and supplements including opioid prescriptions. Patient is not currently taking opioid prescriptions. Functional ability and status Nutritional status Physical activity Advanced directives List of other physicians Hospitalizations, surgeries, and ER visits in previous 12 months Vitals Screenings to include cognitive, depression, and falls Referrals and appointments  In addition, I have reviewed and discussed with patient certain preventive protocols, quality metrics, and best practice recommendations. A written personalized care plan for preventive services as well as  general preventive health recommendations were provided to patient.    Melissa Turner , Thank you for taking time to come for your Medicare Wellness Visit. I appreciate your ongoing commitment to your health goals. Please review the following plan we discussed and let me know if I can assist you in the future.   These are the goals we discussed:  Goals      Increase water intake     Maintain Healthy Lifestyle     Low cholesterol diet Stay active Stay hydrated        This is a list of the screening recommended for you and due dates:  Health Maintenance  Topic Date Due   COVID-19 Vaccine (1) Never done   Zoster (Shingles) Vaccine (1 of 2) 02/02/2023*   Flu Shot  03/25/2023   Medicare Annual Wellness Visit  01/13/2024   Colon Cancer Screening  02/19/2027   DTaP/Tdap/Td vaccine (2 - Td or Tdap) 03/12/2027   Pneumonia Vaccine  Completed   DEXA scan (bone density measurement)  Completed   Hepatitis C Screening: USPSTF Recommendation to screen - Ages 51-79 yo.  Completed   HPV Vaccine  Aged Out  *Topic was postponed. The date shown is not the original due date.     Lonna Cobb, CMA   01/13/2023   Nurse Notes: Approximately 30 minute Non-Face -To-Face Medicare Wellness Visit

## 2023-01-12 NOTE — Patient Instructions (Signed)

## 2023-01-13 ENCOUNTER — Ambulatory Visit (INDEPENDENT_AMBULATORY_CARE_PROVIDER_SITE_OTHER): Payer: PPO

## 2023-01-13 DIAGNOSIS — Z0000 Encounter for general adult medical examination without abnormal findings: Secondary | ICD-10-CM | POA: Diagnosis not present

## 2023-02-11 ENCOUNTER — Other Ambulatory Visit: Payer: Self-pay | Admitting: Family

## 2023-02-11 DIAGNOSIS — I10 Essential (primary) hypertension: Secondary | ICD-10-CM

## 2023-03-02 ENCOUNTER — Ambulatory Visit (INDEPENDENT_AMBULATORY_CARE_PROVIDER_SITE_OTHER): Payer: Self-pay | Admitting: Dermatology

## 2023-03-02 DIAGNOSIS — L988 Other specified disorders of the skin and subcutaneous tissue: Secondary | ICD-10-CM

## 2023-03-02 NOTE — Patient Instructions (Signed)
Due to recent changes in healthcare laws, you may see results of your pathology and/or laboratory studies on MyChart before the doctors have had a chance to review them. We understand that in some cases there may be results that are confusing or concerning to you. Please understand that not all results are received at the same time and often the doctors may need to interpret multiple results in order to provide you with the best plan of care or course of treatment. Therefore, we ask that you please give us 2 business days to thoroughly review all your results before contacting the office for clarification. Should we see a critical lab result, you will be contacted sooner.   If You Need Anything After Your Visit  If you have any questions or concerns for your doctor, please call our main line at [phone] and press option 4 to reach your doctor's medical assistant. If no one answers, please leave a voicemail as directed and we will return your call as soon as possible. Messages left after 4 pm will be answered the following business day.   You may also send us a message via MyChart. We typically respond to MyChart messages within 1-2 business days.  For prescription refills, please ask your pharmacy to contact our office. Our fax number is [phone].  If you have an urgent issue when the clinic is closed that cannot wait until the next business day, you can page your doctor at the number below.    Please note that while we do our best to be available for urgent issues outside of office hours, we are not available 24/7.   If you have an urgent issue and are unable to reach us, you may choose to seek medical care at your doctor's office, retail clinic, urgent care center, or emergency room.  If you have a medical emergency, please immediately call 911 or go to the emergency department.  Pager Numbers  - Dr. Kowalski: [phone]  - Dr. Moye: [phone]  - Dr. Stewart:  [phone]  In the event of inclement weather, please call our main line at [phone] for an update on the status of any delays or closures.  Dermatology Medication Tips: Please keep the boxes that topical medications come in in order to help keep track of the instructions about where and how to use these. Pharmacies typically print the medication instructions only on the boxes and not directly on the medication tubes.   If your medication is too expensive, please contact our office at [phone] option 4 or send us a message through MyChart.   We are unable to tell what your co-pay for medications will be in advance as this is different depending on your insurance coverage. However, we may be able to find a substitute medication at lower cost or fill out paperwork to get insurance to cover a needed medication.   If a prior authorization is required to get your medication covered by your insurance company, please allow us 1-2 business days to complete this process.  Drug prices often vary depending on where the prescription is filled and some pharmacies may offer cheaper prices.  The website www.goodrx.com contains coupons for medications through different pharmacies. The prices here do not account for what the cost may be with help from insurance (it may be cheaper with your insurance), but the website can give you the price if you did not use any insurance.  - You can print the associated coupon and take it with   your prescription to the pharmacy.  - You may also stop by our office during regular business hours and pick up a GoodRx coupon card.  - If you need your prescription sent electronically to a different pharmacy, notify our office through [facility] MyChart or by phone at [phone] option 4.     Si Usted Necesita Algo Despus de Su Visita  Tambin puede enviarnos un mensaje a travs de MyChart. Por lo general respondemos a los mensajes de MyChart en el transcurso de 1 a 2  das hbiles.  Para renovar recetas, por favor pida a su farmacia que se ponga en contacto con nuestra oficina. Nuestro nmero de fax es el [phone].  Si tiene un asunto urgente cuando la clnica est cerrada y que no puede esperar hasta el siguiente da hbil, puede llamar/localizar a su doctor(a) al nmero que aparece a continuacin.   Por favor, tenga en cuenta que aunque hacemos todo lo posible para estar disponibles para asuntos urgentes fuera del horario de oficina, no estamos disponibles las 24 horas del da, los 7 das de la semana.   Si tiene un problema urgente y no puede comunicarse con nosotros, puede optar por buscar atencin mdica  en el consultorio de su doctor(a), en una clnica privada, en un centro de atencin urgente o en una sala de emergencias.  Si tiene una emergencia mdica, por favor llame inmediatamente al 911 o vaya a la sala de emergencias.  Nmeros de bper  - Dr. Kowalski: [phone]  - Dra. Moye: [phone]  - Dra. Stewart: [phone]  En caso de inclemencias del tiempo, por favor llame a nuestra lnea principal al [phone] para una actualizacin sobre el estado de cualquier retraso o cierre.  Consejos para la medicacin en dermatologa: Por favor, guarde las cajas en las que vienen los medicamentos de uso tpico para ayudarle a seguir las instrucciones sobre dnde y cmo usarlos. Las farmacias generalmente imprimen las instrucciones del medicamento slo en las cajas y no directamente en los tubos del medicamento.   Si su medicamento es muy caro, por favor, pngase en contacto con nuestra oficina llamando al [phone] y presione la opcin 4 o envenos un mensaje a travs de MyChart.   No podemos decirle cul ser su copago por los medicamentos por adelantado ya que esto es diferente dependiendo de la cobertura de su seguro. Sin embargo, es posible que podamos encontrar un medicamento sustituto a menor costo o llenar un formulario para que el  seguro cubra el medicamento que se considera necesario.   Si se requiere una autorizacin previa para que su compaa de seguros cubra su medicamento, por favor permtanos de 1 a 2 das hbiles para completar este proceso.  Los precios de los medicamentos varan con frecuencia dependiendo del lugar de dnde se surte la receta y alguna farmacias pueden ofrecer precios ms baratos.  El sitio web www.goodrx.com tiene cupones para medicamentos de diferentes farmacias. Los precios aqu no tienen en cuenta lo que podra costar con la ayuda del seguro (puede ser ms barato con su seguro), pero el sitio web puede darle el precio si no utiliz ningn seguro.  - Puede imprimir el cupn correspondiente y llevarlo con su receta a la farmacia.  - Tambin puede pasar por nuestra oficina durante el horario de atencin regular y recoger una tarjeta de cupones de GoodRx.  - Si necesita que su receta se enve electrnicamente a una farmacia diferente, informe a nuestra oficina a travs de MyChart de [facility]   o por telfono llamando al [phone] y presione la opcin 4.  

## 2023-03-02 NOTE — Progress Notes (Unsigned)
   Follow-Up Visit   Subjective  Melissa Turner is a 82 y.o. female who presents for the following: Botox for facial elastosis  The following portions of the chart were reviewed this encounter and updated as appropriate: medications, allergies, medical history  Review of Systems:  No other skin or systemic complaints except as noted in HPI or Assessment and Plan.  Objective  Well appearing patient in no apparent distress; mood and affect are within normal limits.  A focused examination was performed of the face.  Relevant physical exam findings are noted in the Assessment and Plan.      Assessment & Plan    Facial Elastosis  Botox 40 units injected as marked:  - Frown complex 20 units - Crow's feet 10 units each for a total of 20 units  Location: See attached image  Informed consent: Discussed risks (infection, pain, bleeding, bruising, swelling, allergic reaction, paralysis of nearby muscles, eyelid droop, double vision, neck weakness, difficulty breathing, headache, undesirable cosmetic result, and need for additional treatment) and benefits of the procedure, as well as the alternatives.  Informed consent was obtained.  Preparation: The area was cleansed with alcohol.  Procedure Details:  Botox was injected into the dermis with a 30-gauge needle. Pressure applied to any bleeding. Ice packs offered for swelling.  Lot Number:  [id number] Expiration:  06/26  Total Units Injected:  40  Plan: Tylenol may be used for headache.  Allow 2 weeks before returning to clinic for additional dosing as needed. Patient will call for any problems.  Return in about 4 months (around 07/03/2023) for Botox .  Maylene Roes, CMA, am acting as scribe for Armida Sans, MD .  Documentation: I have reviewed the above documentation for accuracy and completeness, and I agree with the above.  Armida Sans, MD

## 2023-03-03 ENCOUNTER — Encounter: Payer: Self-pay | Admitting: Dermatology

## 2023-03-09 ENCOUNTER — Telehealth: Payer: Self-pay | Admitting: Family

## 2023-03-09 ENCOUNTER — Other Ambulatory Visit: Payer: Self-pay

## 2023-03-09 DIAGNOSIS — M8580 Other specified disorders of bone density and structure, unspecified site: Secondary | ICD-10-CM

## 2023-03-09 NOTE — Telephone Encounter (Signed)
Pt returned [facility] CMA call. Notebelow was read to her. Pt aware and understood. Pt its book for lab on 03/14/22.

## 2023-03-09 NOTE — Telephone Encounter (Signed)
 Noted  

## 2023-03-09 NOTE — Telephone Encounter (Signed)
Patient called and wanted to know if there would be any other labs done besides Vitamin D. There are no lab orders.

## 2023-03-09 NOTE — Telephone Encounter (Signed)
LVM to inform pt that on lab she needs rechecked is Vit D per Claris Che

## 2023-03-15 ENCOUNTER — Other Ambulatory Visit (INDEPENDENT_AMBULATORY_CARE_PROVIDER_SITE_OTHER): Payer: PPO

## 2023-03-15 DIAGNOSIS — M8580 Other specified disorders of bone density and structure, unspecified site: Secondary | ICD-10-CM | POA: Diagnosis not present

## 2023-03-15 LAB — VITAMIN D 25 HYDROXY (VIT D DEFICIENCY, FRACTURES): VITD: 39.35 ng/mL (ref 30.00–100.00)

## 2023-03-17 DIAGNOSIS — M2011 Hallux valgus (acquired), right foot: Secondary | ICD-10-CM | POA: Diagnosis not present

## 2023-03-17 DIAGNOSIS — M2142 Flat foot [pes planus] (acquired), left foot: Secondary | ICD-10-CM | POA: Diagnosis not present

## 2023-03-17 DIAGNOSIS — M79672 Pain in left foot: Secondary | ICD-10-CM | POA: Diagnosis not present

## 2023-03-17 DIAGNOSIS — M792 Neuralgia and neuritis, unspecified: Secondary | ICD-10-CM | POA: Diagnosis not present

## 2023-03-17 DIAGNOSIS — M2021 Hallux rigidus, right foot: Secondary | ICD-10-CM | POA: Diagnosis not present

## 2023-03-17 DIAGNOSIS — T85848A Pain due to other internal prosthetic devices, implants and grafts, initial encounter: Secondary | ICD-10-CM | POA: Diagnosis not present

## 2023-03-17 DIAGNOSIS — M2141 Flat foot [pes planus] (acquired), right foot: Secondary | ICD-10-CM | POA: Diagnosis not present

## 2023-03-17 DIAGNOSIS — M79671 Pain in right foot: Secondary | ICD-10-CM | POA: Diagnosis not present

## 2023-03-19 ENCOUNTER — Telehealth: Payer: Self-pay | Admitting: Cardiovascular Disease

## 2023-03-19 ENCOUNTER — Telehealth: Payer: Self-pay | Admitting: *Deleted

## 2023-03-19 NOTE — Telephone Encounter (Signed)
   Pre-operative Risk Assessment    Patient Name: Melissa Turner  DOB: [birth year] MRN: [id number]{      Request for Surgical Clearance    Procedure:   LEFT FOOT HARDWARE REMOVAL, RIGHT FIRST METATARSAL PGHALANGEAL JOINT FUSION  Date of Surgery:  Clearance 04/15/23                               Surgeon:  DR Rosetta Posner Surgeon's Group or Practice Name:  [facility] Phone number:  [phone] Fax number:  [phone] { Type of Clearance Requested:   - Medical    Type of Anesthesia:  Not Indicated   Additional requests/questions:    Signed, Dalia Heading   03/19/2023, 12:19 PM

## 2023-03-19 NOTE — Telephone Encounter (Signed)
   Name: Melissa Turner  DOB: 10-05-1940  MRN: [id number]  Primary Cardiologist: None   Preoperative team, please contact this patient and set up a phone call appointment for further preoperative risk assessment. Please obtain consent and complete medication review. Thank you for your help.  I confirm that guidance regarding antiplatelet and oral anticoagulation therapy has been completed and, if necessary, noted below.  Per office protocol, if patient is without any new symptoms or concerns at the time of their virtual visit, he/she may hold ASA for 5-7 days prior to procedure. Please resume ASA as soon as possible postprocedure, at the discretion of the surgeon.     Joni Reining, NP 03/19/2023, 1:04 PM [facility]

## 2023-03-19 NOTE — Telephone Encounter (Signed)
Pt has been scheduled for tele pre op appt 9:40. Med rec and consent are done.     Patient Consent for Virtual Visit        Melissa Turner has provided verbal consent on 03/19/2023 for a virtual visit (video or telephone).   CONSENT FOR VIRTUAL VISIT FOR:  Melissa Turner  By participating in this virtual visit I agree to the following:  I hereby voluntarily request, consent and authorize [facility] and its employed or contracted physicians, physician assistants, nurse practitioners or other licensed health care professionals (the Practitioner), to provide me with telemedicine health care services (the "Services") as deemed necessary by the treating Practitioner. I acknowledge and consent to receive the Services by the Practitioner via telemedicine. I understand that the telemedicine visit will involve communicating with the Practitioner through live audiovisual communication technology and the disclosure of certain medical information by electronic transmission. I acknowledge that I have been given the opportunity to request an in-person assessment or other available alternative prior to the telemedicine visit and am voluntarily participating in the telemedicine visit.  I understand that I have the right to withhold or withdraw my consent to the use of telemedicine in the course of my care at any time, without affecting my right to future care or treatment, and that the Practitioner or I may terminate the telemedicine visit at any time. I understand that I have the right to inspect all information obtained and/or recorded in the course of the telemedicine visit and may receive copies of available information for a reasonable fee.  I understand that some of the potential risks of receiving the Services via telemedicine include:  Delay or interruption in medical evaluation due to technological equipment failure or disruption; Information transmitted may not be sufficient (e.g. poor  resolution of images) to allow for appropriate medical decision making by the Practitioner; and/or  In rare instances, security protocols could fail, causing a breach of personal health information.  Furthermore, I acknowledge that it is my responsibility to provide information about my medical history, conditions and care that is complete and accurate to the best of my ability. I acknowledge that Practitioner's advice, recommendations, and/or decision may be based on factors not within their control, such as incomplete or inaccurate data provided by me or distortions of diagnostic images or specimens that may result from electronic transmissions. I understand that the practice of medicine is not an exact science and that Practitioner makes no warranties or guarantees regarding treatment outcomes. I acknowledge that a copy of this consent can be made available to me via my patient portal [facility] MyChart), or I can request a printed copy by calling the office of [facility].    I understand that my insurance will be billed for this visit.   I have read or had this consent read to me. I understand the contents of this consent, which adequately explains the benefits and risks of the Services being provided via telemedicine.  I have been provided ample opportunity to ask questions regarding this consent and the Services and have had my questions answered to my satisfaction. I give my informed consent for the services to be provided through the use of telemedicine in my medical care

## 2023-03-19 NOTE — Telephone Encounter (Signed)
Pt has been scheduled for tele pre op appt 9:40. Med rec and consent are done.

## 2023-04-02 ENCOUNTER — Ambulatory Visit: Payer: PPO | Attending: Cardiology

## 2023-04-02 DIAGNOSIS — Z01818 Encounter for other preprocedural examination: Secondary | ICD-10-CM

## 2023-04-02 DIAGNOSIS — Z01810 Encounter for preprocedural cardiovascular examination: Secondary | ICD-10-CM

## 2023-04-02 NOTE — Progress Notes (Signed)
Virtual Visit via Telephone Note   Because of Melissa Turner's co-morbid illnesses, she is at least at moderate risk for complications without adequate follow up.  This format is felt to be most appropriate for this patient at this time.  The patient did not have access to video technology/had technical difficulties with video requiring transitioning to audio format only (telephone).  All issues noted in this document were discussed and addressed.  No physical exam could be performed with this format.  Please refer to the patient's chart for her consent to telehealth for Melissa Turner.  Evaluation Performed:  Preoperative cardiovascular risk assessment _____________   Date:  04/02/2023   Patient ID:  Melissa Turner, DOB 20-Jan-1941, MRN [id number] Patient Location:  Home Provider location:   Office  Primary Care Provider:  Allegra Grana, FNP Primary Cardiologist:  Melissa Nordmann, MD  Chief Complaint / Patient Profile   82 y.o. y/o female with a h/o hypertension, hyperlipidemia, CAD with elevated CT coronary calcium score of 104 in 2018, PAD, mild aortic atherosclerosis on CT in 2018, remaining on aspirin.  She is pending left foot hardware removal, right first metatarsal phalangeal joint fusion with bunion surgery  by Melissa Turner at the [facility] on 04/15/2023 and presents today for telephonic preoperative cardiovascular risk assessment.  History of Present Illness    Melissa Turner is a 82 y.o. female who presents via Web designer for a telehealth visit today.  Pt was last seen in cardiology clinic on 10/06/2022 by Dr. Mariah Turner.   At that time Melissa Turner was doing well placed on Repatha as she was unable to tolerate Crestor and simvastatin, she denied any anginal symptoms.  The patient is now pending procedure as outlined above. Since her last visit, she had worsening pain in her knees and feet. She is on a drug holiday with this.   Past  Medical History    Past Medical History:  Diagnosis Date   Deviated septum    s/p rhinoplasty   GERD (gastroesophageal reflux disease)    History of blood transfusion    during first child's birth   History of chicken pox    HTN (hypertension)    Hypercholesteremia    Hyperlipidemia    Migraines    Primary hyperparathyroidism (HCC)    Past Surgical History:  Procedure Laterality Date   ABDOMINAL HYSTERECTOMY  11/2017   partial hysterectomy; due to bladder prolapse. has bilateral ovaries.    BREAST CYST ASPIRATION Left    BUNIONECTOMY     COLONOSCOPY     07/12/2006, 11/12/2016   COLONOSCOPY N/A 02/18/2022   Procedure: COLONOSCOPY;  Surgeon: Toledo, Boykin Nearing, MD;  Location: [facility] ENDOSCOPY;  Service: Gastroenterology;  Laterality: N/A;   DILATION AND CURETTAGE OF UTERUS     ear lobe revision     ESOPHAGOGASTRODUODENOSCOPY N/A 02/18/2022   Procedure: ESOPHAGOGASTRODUODENOSCOPY (EGD);  Surgeon: Toledo, Boykin Nearing, MD;  Location: [facility] ENDOSCOPY;  Service: Gastroenterology;  Laterality: N/A;   NOSE SURGERY  1970   PARATHYROIDECTOMY  03/29/2020   RHINOPLASTY     THYROID SURGERY  03/2020   parathyroidectomy   TONSILLECTOMY  1950   VAGINAL DELIVERY     x2    Allergies  No Known Allergies  Home Medications    Prior to Admission medications   Medication Sig Start Date End Date Taking? Authorizing Provider  ALPRAZolam (XANAX) 0.25 MG tablet TAKE 1 TABLET(0.25 MG) BY MOUTH DAILY AS NEEDED FOR ANXIETY  07/13/22   Melissa Grana, FNP  aspirin EC 81 MG tablet Take 325 mg by mouth daily. Swallow whole.    [provider]  Evolocumab (REPATHA SURECLICK) 140 MG/ML SOAJ Inject 140 mg into the skin every 14 (fourteen) days. 10/06/22   Melissa Iba, MD  ezetimibe (ZETIA) 10 MG tablet Take 1 tablet (10 mg total) by mouth daily. Patient not taking: Reported on 01/13/2023 12/09/21   Melissa Grana, FNP  metoprolol succinate (TOPROL-XL) 25 MG 24 hr tablet TAKE 1 AND 1/2  TABLETS(37.5 MG) BY MOUTH DAILY 02/11/23   Melissa Grana, FNP  Multiple Vitamins-Minerals (MULTIVITAMIN WITH MINERALS) tablet Take 1 tablet by mouth daily.    [provider]    Physical Exam    Vital Signs:  Melissa Turner does not have vital signs available for review today.  Given telephonic nature of communication, physical exam is limited. AAOx3. NAD. Normal affect.  Speech and respirations are unlabored.  Accessory Clinical Findings    None  Assessment & Plan    1.  Preoperative Cardiovascular Risk Assessment:  According to the Revised Cardiac Risk Index (RCRI), her Perioperative Risk of Major Cardiac Event is (%): 0.4  Her Functional Capacity in METs is: 9.89 according to the Duke Activity Status Index (DASI).   Per office protocol, if patient is without any new symptoms or concerns at the time of their virtual visit, he/she may hold ASA for 7 days prior to procedure. Please resume ASA as soon as possible postprocedure, at the discretion of the surgeon.    The patient was advised that if she develops new symptoms prior to surgery to contact our office to arrange for a follow-up visit, and she verbalized understanding.  Therefore, based on ACC/AHA guidelines, patient would be at acceptable risk for the planned procedure without further cardiovascular testing. I will route this recommendation to the requesting party via Epic fax function.     Time:   Today, I have spent 10 minutes with the patient with telehealth technology discussing medical history, symptoms, and management plan.     Melissa Reining, NP  04/02/2023, 9:46 AM

## 2023-04-08 ENCOUNTER — Encounter (INDEPENDENT_AMBULATORY_CARE_PROVIDER_SITE_OTHER): Payer: Self-pay

## 2023-04-08 NOTE — Anesthesia Preprocedure Evaluation (Addendum)
Anesthesia Evaluation  Patient identified by MRN, date of birth, ID band Patient awake    Reviewed: Allergy & Precautions, H&P , NPO status , Patient's Chart, lab work & pertinent test results, reviewed documented beta blocker date and time   History of Anesthesia Complications Negative for: history of anesthetic complications  Airway Mallampati: II  TM Distance: >3 FB Neck ROM: full    Dental  (+) Dental Advidsory Given, Implants, Caps, Teeth Intact Permanent bridge x2:   Pulmonary neg pulmonary ROS   Pulmonary exam normal breath sounds clear to auscultation       Cardiovascular Exercise Tolerance: Good hypertension, (-) angina + CAD  (-) Past MI and (-) Cardiac Stents Normal cardiovascular exam(-) dysrhythmias (-) Valvular Problems/Murmurs Rhythm:regular Rate:Normal  Hyperlipidemia CAD, Elevated CT coronary calcium score 104 on scan October 2018 PAD, Mild aortic atherosclerosis on CT 2018 10-06-22 office visit EKG personally reviewed by myself on todays visit NSR rate 73 bpm no significant ST or T wave changes    Neuro/Psych  PSYCHIATRIC DISORDERS Anxiety     negative neurological ROS     GI/Hepatic Neg liver ROS,GERD  ,,  Endo/Other  negative endocrine ROS    Renal/GU negative Renal ROS  negative genitourinary   Musculoskeletal   Abdominal   Peds  Hematology negative hematology ROS (+)   Anesthesia Other Findings Deviated septum  GERD (gastroesophageal reflux disease) HTN (hypertension) Hypercholesteremia  History of blood transfusion Migraines  Primary hyperparathyroidism (HCC) Hyperlipidemia      Reproductive/Obstetrics negative OB ROS                             Anesthesia Physical Anesthesia Plan  ASA: 2  Anesthesia Plan: General   Post-op Pain Management: Ofirmev IV (intra-op) and Toradol IV (intra-op)   Induction: Intravenous  PONV Risk Score and Plan: 3 and  Ondansetron, Midazolam, Treatment may vary due to age or medical condition and Dexamethasone  Airway Management Planned: LMA  Additional Equipment:   Intra-op Plan:   Post-operative Plan: Extubation in OR  Informed Consent: I have reviewed the patients History and Physical, chart, labs and discussed the procedure including the risks, benefits and alternatives for the proposed anesthesia with the patient or authorized representative who has indicated his/her understanding and acceptance.     Dental Advisory Given  Plan Discussed with: Anesthesiologist, CRNA and Surgeon  Anesthesia Plan Comments: (Patient consented for risks of anesthesia including but not limited to:  - adverse reactions to medications - damage to eyes, teeth, lips or other oral mucosa - nerve damage due to positioning  - sore throat or hoarseness - Damage to heart, brain, nerves, lungs, other parts of body or loss of life  Patient voiced understanding.)        Anesthesia Quick Evaluation

## 2023-04-12 ENCOUNTER — Encounter: Payer: Self-pay | Admitting: Podiatry

## 2023-04-13 NOTE — Discharge Instructions (Addendum)
[facility]  POST OPERATIVE INSTRUCTIONS FOR DR. Ether Griffins AND DR. Massiah Longanecker [facility]PART[facility]   Take your medication as prescribed.  Pain medication should be taken only as needed.  Keep the dressing clean, dry and intact.  Keep your foot elevated above the heart level for the first 48 hours.  Continue elevate thereafter to improve swelling.  Remain weightbearing as tolerated in postop shoe to the left foot, right boot with heel contact when ambulating.  Keep ambulation to short distances.  No driving with your right foot until you are out of the boot.  Walking to the bathroom and brief periods of walking are acceptable, unless we have instructed you to be non-weight bearing.  Always wear your post-op shoe/boot when walking.   Do not take a shower. Baths are permissible as long as the foot is kept out of the water.   Every hour you are awake:  Bend your knee 15 times. Massage calf 15 times  Call [facility] [phone]) if any of the following problems occur: You develop a temperature or fever. The bandage becomes saturated with blood. Medication does not stop your pain. Injury of the foot occurs. Any symptoms of infection including redness, odor, or red streaks running from wound.

## 2023-04-28 ENCOUNTER — Other Ambulatory Visit: Payer: Self-pay | Admitting: Podiatry

## 2023-04-28 DIAGNOSIS — M2021 Hallux rigidus, right foot: Secondary | ICD-10-CM | POA: Diagnosis not present

## 2023-04-28 DIAGNOSIS — M79671 Pain in right foot: Secondary | ICD-10-CM | POA: Diagnosis not present

## 2023-04-28 DIAGNOSIS — M79672 Pain in left foot: Secondary | ICD-10-CM | POA: Diagnosis not present

## 2023-04-28 DIAGNOSIS — T85848A Pain due to other internal prosthetic devices, implants and grafts, initial encounter: Secondary | ICD-10-CM | POA: Diagnosis not present

## 2023-04-28 DIAGNOSIS — M2142 Flat foot [pes planus] (acquired), left foot: Secondary | ICD-10-CM | POA: Diagnosis not present

## 2023-04-28 DIAGNOSIS — M2141 Flat foot [pes planus] (acquired), right foot: Secondary | ICD-10-CM | POA: Diagnosis not present

## 2023-04-28 DIAGNOSIS — M792 Neuralgia and neuritis, unspecified: Secondary | ICD-10-CM | POA: Diagnosis not present

## 2023-04-28 DIAGNOSIS — M21621 Bunionette of right foot: Secondary | ICD-10-CM | POA: Diagnosis not present

## 2023-04-28 DIAGNOSIS — M2041 Other hammer toe(s) (acquired), right foot: Secondary | ICD-10-CM | POA: Diagnosis not present

## 2023-04-29 ENCOUNTER — Other Ambulatory Visit: Payer: Self-pay | Admitting: Podiatry

## 2023-04-29 DIAGNOSIS — M2242 Chondromalacia patellae, left knee: Secondary | ICD-10-CM | POA: Diagnosis not present

## 2023-04-29 DIAGNOSIS — M25561 Pain in right knee: Secondary | ICD-10-CM | POA: Diagnosis not present

## 2023-04-29 DIAGNOSIS — M1712 Unilateral primary osteoarthritis, left knee: Secondary | ICD-10-CM | POA: Diagnosis not present

## 2023-04-30 ENCOUNTER — Telehealth: Payer: Self-pay | Admitting: Family

## 2023-04-30 ENCOUNTER — Other Ambulatory Visit: Payer: Self-pay | Admitting: Family

## 2023-04-30 ENCOUNTER — Encounter: Payer: Self-pay | Admitting: Family

## 2023-04-30 DIAGNOSIS — I2510 Atherosclerotic heart disease of native coronary artery without angina pectoris: Secondary | ICD-10-CM

## 2023-04-30 MED ORDER — EZETIMIBE 10 MG PO TABS: 10.0000 mg | ORAL_TABLET | Freq: Every day | ORAL | Status: DC

## 2023-04-30 NOTE — Telephone Encounter (Signed)
Added to allergy section in pt chart

## 2023-04-30 NOTE — Telephone Encounter (Signed)
[facility] team Can you add crestor drug allergies - pt had myalgia?  For some reason, I cannot add     Phone note  Spoke with pt today regarding upcoming foot surgery  We discussed dr Engineer, production recommendation for holding aspirin head of surgery for 5 days, resuming 24 hours after. Discussed patient's history of coronary artery disease.  Discussed risk of stopping aspirin.  We also discussed risk of bleeding while on aspirin.  Patient was agreeable to stopping aspirin for 5 days and resuming 2 days after surgery and understands inherent risk.    We did also discuss LDL elevation.  Patient is amenable to resuming Zetia 10 mg.  Will discuss resuming Repatha

## 2023-05-05 ENCOUNTER — Encounter: Payer: Self-pay | Admitting: Family

## 2023-05-05 ENCOUNTER — Ambulatory Visit (INDEPENDENT_AMBULATORY_CARE_PROVIDER_SITE_OTHER): Payer: PPO | Admitting: Family

## 2023-05-05 VITALS — BP 132/86 | HR 87 | Temp 97.7°F | Ht 60.0 in | Wt 123.8 lb

## 2023-05-05 DIAGNOSIS — I2584 Coronary atherosclerosis due to calcified coronary lesion: Secondary | ICD-10-CM | POA: Diagnosis not present

## 2023-05-05 DIAGNOSIS — R319 Hematuria, unspecified: Secondary | ICD-10-CM | POA: Diagnosis not present

## 2023-05-05 DIAGNOSIS — I2510 Atherosclerotic heart disease of native coronary artery without angina pectoris: Secondary | ICD-10-CM

## 2023-05-05 DIAGNOSIS — R399 Unspecified symptoms and signs involving the genitourinary system: Secondary | ICD-10-CM | POA: Diagnosis not present

## 2023-05-05 DIAGNOSIS — E785 Hyperlipidemia, unspecified: Secondary | ICD-10-CM | POA: Diagnosis not present

## 2023-05-05 LAB — URINALYSIS, ROUTINE W REFLEX MICROSCOPIC
Bilirubin Urine: NEGATIVE
Ketones, ur: NEGATIVE
Leukocytes,Ua: NEGATIVE
Nitrite: NEGATIVE
Specific Gravity, Urine: 1.02 (ref 1.000–1.030)
Total Protein, Urine: NEGATIVE
Urine Glucose: NEGATIVE
Urobilinogen, UA: 0.2 (ref 0.0–1.0)
WBC, UA: NONE SEEN (ref 0–?)
pH: 6 (ref 5.0–8.0)

## 2023-05-05 LAB — POCT URINALYSIS DIPSTICK
Bilirubin, UA: NEGATIVE
Glucose, UA: NEGATIVE
Ketones, UA: NEGATIVE
Leukocytes, UA: NEGATIVE
Nitrite, UA: NEGATIVE
Protein, UA: NEGATIVE
Spec Grav, UA: 1.025 (ref 1.010–1.025)
Urobilinogen, UA: 0.2 U/dL
pH, UA: 5.5 (ref 5.0–8.0)

## 2023-05-05 MED ORDER — REPATHA SURECLICK 140 MG/ML ~~LOC~~ SOAJ
140.0000 mg | SUBCUTANEOUS | Status: DC
Start: 2023-05-05 — End: 2023-10-11

## 2023-05-05 MED ORDER — AMOXICILLIN-POT CLAVULANATE 875-125 MG PO TABS: 1.0000 | ORAL_TABLET | Freq: Two times a day (BID) | ORAL | 0 refills | Status: AC

## 2023-05-05 NOTE — Patient Instructions (Signed)
Question if you have either cleared urinary tract infection or have symptoms at the beginning of one.   I have sent in Augmentin for you to start if symptoms persist or get worse while we await on urine culture.  Ensure to take probiotics while on antibiotics and also for 2 weeks after completion. This can either be by eating yogurt daily or taking a probiotic supplement over the counter such as Culturelle.It is important to re-colonize the gut with good bacteria and also to prevent any diarrheal infections associated with antibiotic use.

## 2023-05-05 NOTE — Assessment & Plan Note (Signed)
Lab Results  Component Value Date   LDLCALC 110 (H) 10/27/2021  Patient is resumes Zetia 10 mg.  We discussed resuming Repatha as well for ASCVD prevention.

## 2023-05-05 NOTE — Assessment & Plan Note (Signed)
Afebrile.  Reassuring abdominal exam.  Symptoms have improved.  Point-of-care urine shows trace RBCs.  Pending urinalysis, urine culture.  I sent in Augmentin to treat if symptoms persist.  Patient may decide to hold on Augmentin until urine culture has resulted which is reasonable as well

## 2023-05-05 NOTE — Progress Notes (Signed)
Assessment & Plan:  UTI symptoms -     POCT urinalysis dipstick -     Urinalysis, Routine w reflex microscopic -     Urine Culture -     Amoxicillin-Pot Clavulanate; Take 1 tablet by mouth 2 (two) times daily for 7 days.  Dispense: 14 tablet; Refill: 0  Hyperlipidemia, unspecified hyperlipidemia type Assessment & Plan: Lab Results  Component Value Date   LDLCALC 110 (H) 10/27/2021  Patient is resumes Zetia 10 mg.  We discussed resuming Repatha as well for ASCVD prevention.    Orders: -     Repatha SureClick; Inject 140 mg into the skin every 14 (fourteen) days.  Coronary artery calcification -     Repatha SureClick; Inject 140 mg into the skin every 14 (fourteen) days.  Hematuria, unspecified type Assessment & Plan: Afebrile.  Reassuring abdominal exam.  Symptoms have improved.  Point-of-care urine shows trace RBCs.  Pending urinalysis, urine culture.  I sent in Augmentin to treat if symptoms persist.  Patient may decide to hold on Augmentin until urine culture has resulted which is reasonable as well      Return precautions given.   Risks, benefits, and alternatives of the medications and treatment plan prescribed today were discussed, and patient expressed understanding.   Education regarding symptom management and diagnosis given to patient on AVS either electronically or printed.  Return in about 6 months (around 11/02/2023).  Rennie Plowman, FNP  Subjective:    Patient ID: Melissa Turner, female    DOB: [birth year], 82 y.o.   MRN: [id number]  CC: Melissa Turner is a 82 y.o. female who presents today for follow up.   HPI:  Complains of episode 'cramping' suprapubic area 14 days, improving.   Associated with dysuria, which resolved with azo.   More noticeable after a meal.   She started azo with relief; now 'a twinge'.   Denies f, n , constipation, diarrhea, epigastric burning, flank pain, abdominal bloating.    She has resumed zetia  10mg     Allergies: Crestor [rosuvastatin] Current Outpatient Medications on File Prior to Visit  Medication Sig Dispense Refill   ALPRAZolam (XANAX) 0.25 MG tablet TAKE 1 TABLET(0.25 MG) BY MOUTH DAILY AS NEEDED FOR ANXIETY 30 tablet 2   aspirin EC 81 MG tablet Take 325 mg by mouth daily. Swallow whole.     ezetimibe (ZETIA) 10 MG tablet Take 1 tablet (10 mg total) by mouth daily.     metoprolol succinate (TOPROL-XL) 25 MG 24 hr tablet TAKE 1 AND 1/2 TABLETS(37.5 MG) BY MOUTH DAILY 135 tablet 3   No current facility-administered medications on file prior to visit.    Review of Systems  Constitutional:  Negative for chills and fever.  Respiratory:  Negative for cough.   Cardiovascular:  Negative for chest pain and palpitations.  Gastrointestinal:  Positive for abdominal pain. Negative for abdominal distention, constipation, diarrhea, nausea and vomiting.  Genitourinary:  Negative for dysuria (resolved).      Objective:    BP 132/86   Pulse 87   Temp 97.7 F (36.5 C) (Oral)   Ht 5' (1.524 m)   Wt 123 lb 12.8 oz (56.2 kg)   SpO2 98%   BMI 24.18 kg/m  BP Readings from Last 3 Encounters:  05/05/23 132/86  11/03/22 (!) 155/89  11/02/22 136/76   Wt Readings from Last 3 Encounters:  05/05/23 123 lb 12.8 oz (56.2 kg)  11/02/22 122 lb 12.8 oz (55.7 kg)  10/06/22 123  lb 4 oz (55.9 kg)    Physical Exam Vitals reviewed.  Constitutional:      Appearance: Normal appearance. She is well-developed.  Eyes:     Conjunctiva/sclera: Conjunctivae normal.  Cardiovascular:     Rate and Rhythm: Normal rate and regular rhythm.     Pulses: Normal pulses.     Heart sounds: Normal heart sounds.  Pulmonary:     Effort: Pulmonary effort is normal.     Breath sounds: Normal breath sounds. No wheezing, rhonchi or rales.  Abdominal:     General: Bowel sounds are normal. There is no distension.     Palpations: Abdomen is soft. Abdomen is not rigid. There is no fluid wave or mass.      Tenderness: There is no abdominal tenderness. There is no guarding or rebound.  Skin:    General: Skin is warm and dry.  Neurological:     Mental Status: She is alert.  Psychiatric:        Speech: Speech normal.        Behavior: Behavior normal.        Thought Content: Thought content normal.

## 2023-05-06 ENCOUNTER — Encounter
Admission: RE | Disposition: A | Discharge status: Home / Self Care | Payer: Self-pay | Source: Home / Self Care | Attending: Podiatry

## 2023-05-06 ENCOUNTER — Other Ambulatory Visit: Payer: Self-pay

## 2023-05-06 ENCOUNTER — Ambulatory Visit
Admission: RE | Admit: 2023-05-06 | Discharge: 2023-05-06 | Disposition: A | Discharge status: Home / Self Care | Payer: PPO | Attending: Podiatry | Admitting: Podiatry

## 2023-05-06 ENCOUNTER — Encounter: Payer: Self-pay | Admitting: Podiatry

## 2023-05-06 ENCOUNTER — Ambulatory Visit: Payer: PPO | Admitting: Anesthesiology

## 2023-05-06 ENCOUNTER — Ambulatory Visit: Payer: Self-pay

## 2023-05-06 DIAGNOSIS — K319 Disease of stomach and duodenum, unspecified: Secondary | ICD-10-CM | POA: Diagnosis not present

## 2023-05-06 DIAGNOSIS — M2011 Hallux valgus (acquired), right foot: Secondary | ICD-10-CM | POA: Insufficient documentation

## 2023-05-06 DIAGNOSIS — T8484XA Pain due to internal orthopedic prosthetic devices, implants and grafts, initial encounter: Secondary | ICD-10-CM | POA: Diagnosis not present

## 2023-05-06 DIAGNOSIS — I2510 Atherosclerotic heart disease of native coronary artery without angina pectoris: Secondary | ICD-10-CM | POA: Diagnosis not present

## 2023-05-06 DIAGNOSIS — M21621 Bunionette of right foot: Secondary | ICD-10-CM | POA: Insufficient documentation

## 2023-05-06 DIAGNOSIS — T85848A Pain due to other internal prosthetic devices, implants and grafts, initial encounter: Secondary | ICD-10-CM | POA: Diagnosis not present

## 2023-05-06 DIAGNOSIS — I252 Old myocardial infarction: Secondary | ICD-10-CM | POA: Diagnosis not present

## 2023-05-06 DIAGNOSIS — M205X1 Other deformities of toe(s) (acquired), right foot: Secondary | ICD-10-CM | POA: Diagnosis not present

## 2023-05-06 DIAGNOSIS — M2041 Other hammer toe(s) (acquired), right foot: Secondary | ICD-10-CM | POA: Diagnosis not present

## 2023-05-06 DIAGNOSIS — E213 Hyperparathyroidism, unspecified: Secondary | ICD-10-CM | POA: Diagnosis not present

## 2023-05-06 DIAGNOSIS — I10 Essential (primary) hypertension: Secondary | ICD-10-CM | POA: Diagnosis not present

## 2023-05-06 DIAGNOSIS — Y792 Prosthetic and other implants, materials and accessory orthopedic devices associated with adverse incidents: Secondary | ICD-10-CM | POA: Diagnosis not present

## 2023-05-06 DIAGNOSIS — I700 Atherosclerosis of aorta: Secondary | ICD-10-CM | POA: Insufficient documentation

## 2023-05-06 DIAGNOSIS — M2021 Hallux rigidus, right foot: Secondary | ICD-10-CM | POA: Diagnosis not present

## 2023-05-06 HISTORY — PX: WEIL OSTEOTOMY: SHX5044

## 2023-05-06 HISTORY — PX: MINOR HARDWARE REMOVAL: SHX6474

## 2023-05-06 HISTORY — DX: Atherosclerotic heart disease of native coronary artery without angina pectoris: I25.10

## 2023-05-06 HISTORY — PX: CHEILECTOMY: SHX1336

## 2023-05-06 LAB — URINE CULTURE
MICRO NUMBER:: 15452126
Result:: NO GROWTH
SPECIMEN QUALITY:: ADEQUATE

## 2023-05-06 SURGERY — CHEILECTOMY
Anesthesia: General | Site: Foot | Laterality: Right

## 2023-05-06 MED ORDER — CEFAZOLIN SODIUM-DEXTROSE 2-4 GM/100ML-% IV SOLN
2.0000 g | INTRAVENOUS | Status: AC
  Administered 2023-05-06: 2 g via INTRAVENOUS

## 2023-05-06 MED ORDER — ONDANSETRON HCL 4 MG/2ML IJ SOLN
INTRAMUSCULAR | Status: DC | PRN
  Administered 2023-05-06: 4 mg via INTRAVENOUS

## 2023-05-06 MED ORDER — ASPIRIN EC 81 MG PO TBEC: 325.0000 mg | DELAYED_RELEASE_TABLET | Freq: Every day | ORAL | Status: AC

## 2023-05-06 MED ORDER — 0.9 % SODIUM CHLORIDE (POUR BTL) OPTIME
TOPICAL | Status: DC | PRN
  Administered 2023-05-06: 1000 mL

## 2023-05-06 MED ORDER — PROPOFOL 10 MG/ML IV BOLUS
INTRAVENOUS | Status: DC | PRN
  Administered 2023-05-06: 150 mg via INTRAVENOUS

## 2023-05-06 MED ORDER — EPHEDRINE SULFATE (PRESSORS) 50 MG/ML IJ SOLN
INTRAMUSCULAR | Status: DC | PRN
Start: 2023-05-06 — End: 2023-05-06
  Administered 2023-05-06: 5 mg via INTRAVENOUS
  Administered 2023-05-06: 10 mg via INTRAVENOUS
  Administered 2023-05-06: 5 mg via INTRAVENOUS

## 2023-05-06 MED ORDER — BUPIVACAINE LIPOSOME 1.3 % IJ SUSP
INTRAMUSCULAR | Status: DC | PRN
Start: 2023-05-06 — End: 2023-05-06
  Administered 2023-05-06: 10 mL

## 2023-05-06 MED ORDER — LIDOCAINE HCL (CARDIAC) PF 100 MG/5ML IV SOSY
PREFILLED_SYRINGE | INTRAVENOUS | Status: DC | PRN
  Administered 2023-05-06: 60 mg via INTRATRACHEAL

## 2023-05-06 MED ORDER — LACTATED RINGERS IV SOLN: INTRAVENOUS | Status: DC

## 2023-05-06 MED ORDER — MIDAZOLAM HCL 5 MG/5ML IJ SOLN
INTRAMUSCULAR | Status: DC | PRN
  Administered 2023-05-06: 1 mg via INTRAVENOUS

## 2023-05-06 MED ORDER — FENTANYL CITRATE (PF) 100 MCG/2ML IJ SOLN
INTRAMUSCULAR | Status: DC | PRN
  Administered 2023-05-06 (×2): 25 ug via INTRAVENOUS

## 2023-05-06 MED ORDER — OXYCODONE-ACETAMINOPHEN 5-325 MG PO TABS: 1.0000 | ORAL_TABLET | Freq: Four times a day (QID) | ORAL | 0 refills | Status: AC | PRN

## 2023-05-06 MED ORDER — DEXAMETHASONE SODIUM PHOSPHATE 4 MG/ML IJ SOLN
INTRAMUSCULAR | Status: DC | PRN
  Administered 2023-05-06: 4 mg via INTRAVENOUS

## 2023-05-06 MED ORDER — BUPIVACAINE HCL (PF) 0.5 % IJ SOLN
INTRAMUSCULAR | Status: DC | PRN
  Administered 2023-05-06: 22 mL
  Administered 2023-05-06: 3 mL

## 2023-05-06 SURGICAL SUPPLY — 50 items
APL SKNCLS STERI-STRIP NONHPOA (GAUZE/BANDAGES/DRESSINGS) ×2
BENZOIN TINCTURE PRP APPL 2/3 (GAUZE/BANDAGES/DRESSINGS) IMPLANT
BIT DRILL 1.7 LNG CANN (DRILL) IMPLANT
BLADE OSC/SAGITTAL MD 9X18.5 (BLADE) IMPLANT
BLADE SURG 15 STRL LF DISP TIS (BLADE) IMPLANT
BLADE SURG 15 STRL SS (BLADE) ×6
BNDG CMPR 5X4 CHSV STRCH STRL (GAUZE/BANDAGES/DRESSINGS) ×4
BNDG CMPR STD VLCR NS LF 5.8X4 (GAUZE/BANDAGES/DRESSINGS) ×4
BNDG CMPR STD VLCR NS LF 5.8X6 (GAUZE/BANDAGES/DRESSINGS) ×4
BNDG COHESIVE 4X5 TAN STRL LF (GAUZE/BANDAGES/DRESSINGS) ×2 IMPLANT
BNDG ELASTIC 4X5.8 VLCR NS LF (GAUZE/BANDAGES/DRESSINGS) ×2 IMPLANT
BNDG ELASTIC 6X5.8 VLCR NS LF (GAUZE/BANDAGES/DRESSINGS) ×2 IMPLANT
BNDG GAUZE DERMACEA FLUFF 4 (GAUZE/BANDAGES/DRESSINGS) ×2 IMPLANT
BNDG GZE DERMACEA 4 6PLY (GAUZE/BANDAGES/DRESSINGS) ×4
DRAPE FLUOR MINI C-ARM 54X84 (DRAPES) ×2 IMPLANT
DURAPREP 26ML APPLICATOR (WOUND CARE) ×2 IMPLANT
ELECT REM PT RETURN 9FT ADLT (ELECTROSURGICAL) ×2
ELECTRODE REM PT RTRN 9FT ADLT (ELECTROSURGICAL) ×2 IMPLANT
GAUZE 4X4 16PLY ~~LOC~~+RFID DBL (SPONGE) IMPLANT
GAUZE SPONGE 4X4 12PLY STRL (GAUZE/BANDAGES/DRESSINGS) ×2 IMPLANT
GAUZE XEROFORM 1X8 LF (GAUZE/BANDAGES/DRESSINGS) ×2 IMPLANT
GLOVE BIOGEL PI IND STRL 7.5 (GLOVE) ×2 IMPLANT
GLOVE SURG SS PI 7.0 STRL IVOR (GLOVE) ×2 IMPLANT
GOWN STRL REUS W/ TWL LRG LVL3 (GOWN DISPOSABLE) ×4 IMPLANT
GOWN STRL REUS W/TWL LRG LVL3 (GOWN DISPOSABLE) ×4
K-WIRE DBL END TROCAR 6X.045 (WIRE) ×2
KWIRE DBL END TROCAR 6X.045 (WIRE) IMPLANT
NDL FILTER BLUNT 18X1 1/2 (NEEDLE) ×2 IMPLANT
NDL HYPO 25GX1 SAFETY (NEEDLE) ×6 IMPLANT
NEEDLE FILTER BLUNT 18X1 1/2 (NEEDLE) ×2
NEEDLE HYPO 25GX1 SAFETY (NEEDLE) ×6
NS IRRIG 500ML POUR BTL (IV SOLUTION) ×2 IMPLANT
PACK EXTREMITY ARMC (MISCELLANEOUS) ×2 IMPLANT
PADDING CAST BLEND 4X4 NS (MISCELLANEOUS) ×6 IMPLANT
SCREW CANN FT 2.0X10 (Screw) IMPLANT
SCREW CANN HEAD ST 2.0X10 (Screw) ×4 IMPLANT
SHOE POST OP SQ TOE SM (MISCELLANEOUS) IMPLANT
SPLINT CAST 1 STEP 4X30 (MISCELLANEOUS) ×2 IMPLANT
STOCKINETTE IMPERVIOUS LG (DRAPES) ×2 IMPLANT
STRIP CLOSURE SKIN 1/4X4 (GAUZE/BANDAGES/DRESSINGS) IMPLANT
SUT ETHILON 3-0 (SUTURE) IMPLANT
SUT MNCRL 4-0 (SUTURE) ×2
SUT MNCRL 4-0 27XMFL (SUTURE) ×2
SUT VIC AB 3-0 SH 27 (SUTURE) ×2
SUT VIC AB 3-0 SH 27X BRD (SUTURE) IMPLANT
SUT VIC AB 4-0 SH 27 (SUTURE) ×2
SUT VIC AB 4-0 SH 27XANBCTRL (SUTURE) IMPLANT
SUTURE MNCRL 4-0 27XMF (SUTURE) IMPLANT
SYR 10ML LL (SYRINGE) ×2 IMPLANT
WIRE SMOOTH TROCAR .9MMX150MML (WIRE) IMPLANT

## 2023-05-06 NOTE — Op Note (Signed)
PODIATRY / FOOT AND ANKLE SURGERY OPERATIVE REPORT    SURGEON: Rosetta Posner, DPM  PRE-OPERATIVE DIAGNOSIS:  1.  Right hallux rigidus, hallux valgus 2.  Right tailor's bunion 3.  Painful orthopedic hardware left foot  POST-OPERATIVE DIAGNOSIS: Same  PROCEDURE(S): Left foot removal of hardware Right first metatarsal phalange joint cheilectomy with McBride procedure Right tailor's bunionectomy with osteotomy  HEMOSTASIS: Right ankle, left ankle tourniquet  ANESTHESIA: MAC  ESTIMATED BLOOD LOSS: 10 cc  FINDING(S): 1.  Prominent hardware present at the first metatarsal head left foot, one of the K wires appeared to be buried very well and was unable to be removed but the other prominent one was removed more easily. 2.  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 female who presents with pain to the left first metatarsal dorsally in the area of prominent orthopedic hardware from previous bunionectomy.  Patient also complains of a painful bump to the right first metatarsal phalange joint and fifth metatarsal phalange joint.  All treatment options were discussed with the patient both conservative and surgical attempts at correction clean potential risks and complications at this time patient is elected for surgical intervention consisting of left foot hardware removal, right foot tailor's bunionectomy with osteotomy, right foot cheilectomy with McBride procedure and possible Akin osteotomy.  Consent obtained prior to procedure, no guarantees given.  Discussed postoperative course in great detail..  DESCRIPTION: After obtaining full informed written consent, the patient was brought back to the operating room and placed supine upon the operating table.  The patient received IV antibiotics prior to induction.  After obtaining adequate anesthesia, the patient was prepped and draped  in the standard fashion.  20 cc of half percent Marcaine plain was injected about the operative sites to the right foot and 5 cc of half percent Marcaine plain was injected about the operative site to the left foot.  The pneumatic ankle tourniquet was inflated after an Esmarch bandage was used to compress the right lower extremity.  Attention was directed to the right first metatarsal phalangeal joint area where a linear longitudinal incision was made over the area following the contour of the deformity medial to the tendon of the extensor hallucis longus.  The incision was deepened through the subcutaneous tissues utilizing sharp and blunt dissection and care was taken to identify and retract all vital neurovascular structures and all venous contributories were cauterized as necessary.  At this time a capsular incision was made along the length of the incision.  The capsular and periosteal tissue was reflected at the operative site thereby exposing the first metatarsal phalangeal joint.  There appeared to be severe articular cartilage damage and there appeared to be very little cartilage overall present to the joint.  There appeared to be a joint mouse present at the lateral aspect of the joint as well as exostosis present at the dorsal aspect of the joint, medial aspect of the joint, and lateral aspect of the joint.  There also appeared to be a ganglion type of cyst present within the soft tissues which was resected and passed off the operative site.  It appeared to have gelatinous fluid in it consistent with ganglion cyst material.  At this time the medial first metatarsal head/prominence was resected and passed off the operative site.  The dorsal eminence was passed off the operative site after resection as well.  A rongeur was used  to recontour the joint further to remove any bone spurs present at the joint surface.  C-arm imaging was utilized to verify resection of bone which appeared to be excellent.  After  resection of the bone the capsular tissues were brought into a tightened position and this appeared to straighten the toe.  No need for Akin was seen at this time.  The surgical site was flushed with copious amounts normal sterile saline.  A capsulotomy was performed removing of the section of the capsule of the first metatarsal phalange joint medially.  This was then reapproximated well coapted while holding the toe in a rectus position with 3-0 Vicryl.  The remaining capsular and periosteal tissue was reapproximated well coapted with 3-0 Vicryl.  The subcutaneous tissue was reapproximated well coapted with 4-0 Vicryl and the skin was then reapproximated well coapted with subcuticular stitching with 4-0 Monocryl.  Benzoin and Steri-Strips were applied.    Attention was then directed to the right fifth metatarsal phalangeal joint area where a linear longitudinal incision was made over the bunion area.  The incision was made lateral to the tendon of the extensor digitorum longus involve the contour of the deformity.  The incision was deepened through the subcutaneous tissues utilizing sharp and blunt dissection and care was taken to identify and retract all vital neurovascular structures and all venous contributories were cauterized as necessary.  At this time a capsular and periosteal incision was made lateral to the tendon of the extensor digitorum longus and involve the contour deformity.  The capsular and periosteal tissue was reflected medially and laterally thereby exposing the fifth metatarsal phalange joint at the operative site.  At this time the lateral eminence was resected with a sagittal bone saw and passed off the operative site.  A 5th metatarsal long Weil type osteotomy was performed.  After the osteotomy was completed the capital fragment was shifted medially and rotated slightly medially as well to reduce the lateral deviation angle as well as the fourth intermetatarsal space angle.  This was  checked under fluoroscopic guidance and appeared to sit in appropriate position.  This was then temporary fixated with 2 K wires for 2.0 cannulated screws from [employer].  Two 2.0 x 10 mm partially-threaded headed cannulated screws were placed over the K wires utilizing standard AO principles and techniques with appropriate fixation.  It appeared to be well stabilized.  The lateral overhang was then resected and passed off in the operative site.  Final C-arm imaging was then taken of the right foot showing reduction of the medial eminence and straightening of the toe at the first metatarsal phalangeal joint area and great reduction of the tailor's bunion with excellent compression across the osteotomy site.  The surgical site was flushed with copious amounts normal sterile saline.  The capsular and periosteal tissue was reapproximated well coapted with 3-0 Vicryl.  The subcutaneous tissue was reapproximated well coapted with 4-0 Vicryl.  The skin was then reapproximated well coapted with subcuticular stitching with 4-0 Monocryl.  Benzoin Steri-Strips were applied.  A postoperative dressing is applied to the right foot consisting of Xeroform to the incision lines followed by 4 x 4 gauze, gauze roll, Ace wrap.  The pneumatic ankle tourniquet was deflated and a prompt hyperemic response was noted all digits of the right foot.  An Esmarch bandage was used to exsanguinate the left lower extremity and the pneumatic ankle tourniquet was inflated.  Attention was then directed to the distal aspect the first metatarsal  where prominent orthopedic hardware was palpable.  A small incision was made over this area and incision was bluntly dissected down to the level of the hardware which was prominent.  It appeared to be a bent K wire.  The pin K wire appeared to be somewhat loose but fairly adhered within the bone so it did take a fair amount of effort but the K wire was able to be removed.  The second K wire was seen and  appeared to be buried into the bone with virtually no prominence.  Attempts were made to remove this but appeared to be futile as the wire appeared to be fused within the bone.  C-arm imaging was utilized to verify removal of hardware which appeared to be within normal limits.  The remaining hardware present in the first metatarsal did not appear to be prominent.  The surgical site was flushed with copious amounts normal sterile saline.  The subcutaneous tissue was reapproximated well coapted with 4-0 Vicryl and the skin was then reapproximated well coapted with 3-0 nylon.  The pneumatic ankle tourniquet was deflated and a prompt hyperemic response was noted all digits left foot.  A postoperative dressing was applied consisting of Xeroform to the incisional area followed by 4 x 4 gauze, gauze roll, Ace wrap, postop shoe.  A cam boot was then placed to the right lower extremity.  The patient tolerated the procedure and anesthesia well and was transferred to recovery vital signs stable vascular status intact all toes of the right and left foot.  Following a period of postoperative monitoring the patient be discharged home with the appropriate orders, instructions, and medications.  Patient to follow-up in outpatient clinic in 1 week for further evaluation.    COMPLICATIONS: None  CONDITION: Good, stable  Rosetta Posner, DPM

## 2023-05-06 NOTE — Anesthesia Postprocedure Evaluation (Signed)
Anesthesia Post Note  Patient: Melissa Turner  Procedure(s) Performed: CHEILECTOMY (Right: Foot) MINOR HARDWARE REMOVAL (Left: Foot) WEIL OSTEOTOMY FIFTH (Right: Foot)  Patient location during evaluation: PACU Anesthesia Type: General Level of consciousness: awake and alert Pain management: pain level controlled Vital Signs Assessment: post-procedure vital signs reviewed and stable Respiratory status: spontaneous breathing, nonlabored ventilation, respiratory function stable and patient connected to nasal cannula oxygen Cardiovascular status: blood pressure returned to baseline and stable Postop Assessment: no apparent nausea or vomiting Anesthetic complications: no  No notable events documented.   Last Vitals:  Vitals:   05/06/23 0945 05/06/23 1000  BP: (!) 142/65 (!) 156/80  Pulse: 84 88  Resp: 11 20  Temp:    SpO2: 98% 99%    Last Pain:  Vitals:   05/06/23 1000  TempSrc:   PainSc: 0-No pain                 Stephanie Coup

## 2023-05-06 NOTE — OR Nursing (Signed)
One k-wire removed left foot.

## 2023-05-06 NOTE — H&P (Signed)
HISTORY AND PHYSICAL INTERVAL NOTE:  05/06/2023  7:14 AM  Melissa Turner  has presented today for surgery, with the diagnosis of M20.21 - Hallux rigidus of right foot T85.848A - Pain from implanted hardware M21.621 - Tailor's bunion of right foot M20.41 - Hammertoe of right foot M79.672 - Pain in left foot.  The various methods of treatment have been discussed with the patient.  No guarantees were given.  After consideration of risks, benefits and other options for treatment, the patient has consented to surgery.  I have reviewed the patients' chart and labs.    PROCEDURE: LEFT FOOT HARDWARE REMOVAL RIGHT 1ST MTPJ CHELIECTOMY WITH MCBRIDE PROCEDURE RIGHT AKIN OSTEOTOMY RIGHT TAILORS BUNIONECTOMY WITH OSTEOTOMY  A history and physical examination was performed in my office.  The patient was reexamined.  There have been no changes to this history and physical examination.  Rosetta Posner, DPM

## 2023-05-06 NOTE — Anesthesia Procedure Notes (Signed)
Procedure Name: LMA Insertion Date/Time: 05/06/2023 7:33 AM  Performed by: Barbette Hair, CRNAPre-anesthesia Checklist: Patient identified, Emergency Drugs available, Suction available, Patient being monitored and Timeout performed Patient Re-evaluated:Patient Re-evaluated prior to induction Oxygen Delivery Method: Circle system utilized Induction Type: IV induction LMA Size: 4.0 Number of attempts: 1 Placement Confirmation: positive ETCO2, CO2 detector and breath sounds checked- equal and bilateral Tube secured with: Tape Dental Injury: Teeth and Oropharynx as per pre-operative assessment

## 2023-05-06 NOTE — Transfer of Care (Signed)
Immediate Anesthesia Transfer of Care Note  Patient: Melissa Turner  Procedure(s) Performed: CHEILECTOMY (Right: Foot) MINOR HARDWARE REMOVAL (Left: Foot) WEIL OSTEOTOMY FIFTH (Right: Foot)  Patient Location: PACU  Anesthesia Type: General  Level of Consciousness: awake, alert  and patient cooperative  Airway and Oxygen Therapy: Patient Spontanous Breathing and Patient connected to supplemental oxygen  Post-op Assessment: Post-op Vital signs reviewed, Patient's Cardiovascular Status Stable, Respiratory Function Stable, Patent Airway and No signs of Nausea or vomiting  Post-op Vital Signs: Reviewed and stable  Complications: No notable events documented.

## 2023-05-07 ENCOUNTER — Encounter: Payer: Self-pay | Admitting: Podiatry

## 2023-05-10 ENCOUNTER — Encounter: Payer: Self-pay | Admitting: Podiatry

## 2023-05-12 DIAGNOSIS — M2021 Hallux rigidus, right foot: Secondary | ICD-10-CM | POA: Diagnosis not present

## 2023-05-13 ENCOUNTER — Other Ambulatory Visit: Payer: Self-pay | Admitting: Family

## 2023-05-13 DIAGNOSIS — Z1231 Encounter for screening mammogram for malignant neoplasm of breast: Secondary | ICD-10-CM

## 2023-05-20 ENCOUNTER — Encounter: Payer: Self-pay | Admitting: *Deleted

## 2023-06-02 DIAGNOSIS — M2021 Hallux rigidus, right foot: Secondary | ICD-10-CM | POA: Diagnosis not present

## 2023-06-07 ENCOUNTER — Other Ambulatory Visit: Payer: Self-pay | Admitting: Family

## 2023-06-07 DIAGNOSIS — I2510 Atherosclerotic heart disease of native coronary artery without angina pectoris: Secondary | ICD-10-CM

## 2023-06-10 ENCOUNTER — Ambulatory Visit
Admission: RE | Admit: 2023-06-10 | Discharge: 2023-06-10 | Disposition: A | Discharge status: Home / Self Care | Payer: PPO | Source: Ambulatory Visit | Attending: Family | Admitting: Family

## 2023-06-10 DIAGNOSIS — Z1231 Encounter for screening mammogram for malignant neoplasm of breast: Secondary | ICD-10-CM | POA: Insufficient documentation

## 2023-06-16 DIAGNOSIS — T85848A Pain due to other internal prosthetic devices, implants and grafts, initial encounter: Secondary | ICD-10-CM | POA: Diagnosis not present

## 2023-06-16 DIAGNOSIS — M2021 Hallux rigidus, right foot: Secondary | ICD-10-CM | POA: Diagnosis not present

## 2023-07-06 ENCOUNTER — Ambulatory Visit (INDEPENDENT_AMBULATORY_CARE_PROVIDER_SITE_OTHER): Payer: Self-pay | Admitting: Dermatology

## 2023-07-06 DIAGNOSIS — L988 Other specified disorders of the skin and subcutaneous tissue: Secondary | ICD-10-CM

## 2023-07-06 NOTE — Progress Notes (Signed)
   Follow-Up Visit   Subjective  Melissa Turner is a 82 y.o. female who presents for the following: Botox for facial elastosis  The following portions of the chart were reviewed this encounter and updated as appropriate: medications, allergies, medical history  Review of Systems:  No other skin or systemic complaints except as noted in HPI or Assessment and Plan.  Objective  Well appearing patient in no apparent distress; mood and affect are within normal limits.  A focused examination was performed of the face.  Relevant physical exam findings are noted in the Assessment and Plan.      Assessment & Plan    Facial Elastosis  Botox 40 units injected as marked:  - Frown complex 20 units - Crow's feet 10 units each for a total of 20 units  Location: See attached image  Informed consent: Discussed risks (infection, pain, bleeding, bruising, swelling, allergic reaction, paralysis of nearby muscles, eyelid droop, double vision, neck weakness, difficulty breathing, headache, undesirable cosmetic result, and need for additional treatment) and benefits of the procedure, as well as the alternatives.  Informed consent was obtained.  Preparation: The area was cleansed with alcohol.  Procedure Details:  Botox was injected into the dermis with a 30-gauge needle. Pressure applied to any bleeding. Ice packs offered for swelling.  Lot Number:  [id number] Expiration:  04/2025  Total Units Injected:  40  Plan: Tylenol may be used for headache.  Allow 2 weeks before returning to clinic for additional dosing as needed. Patient will call for any problems.  Return in about 4 months (around 11/03/2023) for Botox injections.  Maylene Roes, CMA, am acting as scribe for Armida Sans, MD .   Documentation: I have reviewed the above documentation for accuracy and completeness, and I agree with the above.  Armida Sans, MD

## 2023-07-06 NOTE — Patient Instructions (Signed)

## 2023-07-13 ENCOUNTER — Encounter: Payer: Self-pay | Admitting: Dermatology

## 2023-07-23 ENCOUNTER — Other Ambulatory Visit: Payer: Self-pay | Admitting: Family

## 2023-07-23 DIAGNOSIS — F419 Anxiety disorder, unspecified: Secondary | ICD-10-CM

## 2023-07-30 ENCOUNTER — Other Ambulatory Visit: Payer: Self-pay | Admitting: Family

## 2023-07-30 DIAGNOSIS — F419 Anxiety disorder, unspecified: Secondary | ICD-10-CM

## 2023-07-30 MED ORDER — ALPRAZOLAM 0.25 MG PO TABS: ORAL_TABLET | ORAL | 0 refills | Status: DC

## 2023-08-25 HISTORY — PX: OTHER SURGICAL HISTORY: SHX169

## 2023-10-09 NOTE — Progress Notes (Unsigned)
 Cardiology Office Note  Date:  10/11/2023   ID:  Kavita Bartl Schlink, DOB [birth year], MRN [id number]  PCP:  Allegra Grana, FNP   Chief Complaint  Patient presents with   6 month follow up     "Doing well."     HPI:  Ms. Luo is a 83 year old woman with history of nonsmoker HTN Hyperlipidemia CAD, Elevated CT coronary calcium score 104 on scan October 2018 PAD, Mild aortic atherosclerosis on CT 2018 Who presents for follow-up of her coronary artery disease, hypertension  Last seen in clinic February 2023 Overall reports feeling well Denies shortness of breath or chest pain concerning for angina  Blood pressure markedly elevated on arrival BP severely elevated, 180, repeat 150 systolic At home 629 systolic On my repeat today systolic remained in the mid 160 range  Seen by orthopedics, has knee issues Stopped repatha out of concern it was causing knee pain Has 16 stairs, goes up and down several times a day Also stopped zetia, unclear reasons, no side effects Would like to stay on Repatha  Total chol 237, LDL 160  Difficulty tolerating Crestor, simvastatin secondary to joint and muscle problems  Sept 2021, covid Long recovery Quit metoprolol when she had covid Joint pain  EKG personally reviewed by myself on todays visit EKG Interpretation Date/Time:  Monday October 11 2023 09:55:18 EST Ventricular Rate:  77 PR Interval:  128 QRS Duration:  68 QT Interval:  384 QTC Calculation: 434 R Axis:   -1  Text Interpretation: Normal sinus rhythm Normal ECG No previous ECGs available Confirmed by Julien Nordmann [phone]) on 10/11/2023 10:23:54 AM     Lab Results  Component Value Date   CHOL 237 (H) 11/02/2022   HDL 39.30 11/02/2022   LDLCALC 110 (H) 10/27/2021   TRIG 224.0 (H) 11/02/2022    Other past medical history reviewed Family history reviewed on today's visit Mom with CAD age 22, PCI, smoker Father possible CHF, diabetes GM with MI, 48, died of CVA,  smoker  PMH:   has a past medical history of CAD (coronary artery disease), Deviated septum, GERD (gastroesophageal reflux disease), History of blood transfusion, History of chicken pox, HTN (hypertension), Hypercholesteremia, Hyperlipidemia, Migraines, and Primary hyperparathyroidism (HCC).  PSH:    Past Surgical History:  Procedure Laterality Date   ABDOMINAL HYSTERECTOMY  11/2017   partial hysterectomy; due to bladder prolapse. has bilateral ovaries.    BREAST CYST ASPIRATION Left    BUNIONECTOMY     CHEILECTOMY Right 05/06/2023   Procedure: CHEILECTOMY;  Surgeon: Rosetta Posner, DPM;  Location: [facility];  Service: Orthopedics/Podiatry;  Laterality: Right;   COLONOSCOPY     07/12/2006, 11/12/2016   COLONOSCOPY N/A 02/18/2022   Procedure: COLONOSCOPY;  Surgeon: Toledo, Boykin Nearing, MD;  Location: [facility];  Service: Gastroenterology;  Laterality: N/A;   DILATION AND CURETTAGE OF UTERUS     ear lobe revision     ESOPHAGOGASTRODUODENOSCOPY N/A 02/18/2022   Procedure: ESOPHAGOGASTRODUODENOSCOPY (EGD);  Surgeon: Toledo, Boykin Nearing, MD;  Location: [facility];  Service: Gastroenterology;  Laterality: N/A;   MINOR HARDWARE REMOVAL Left 05/06/2023   Procedure: MINOR HARDWARE REMOVAL;  Surgeon: Rosetta Posner, DPM;  Location: [facility];  Service: Orthopedics/Podiatry;  Laterality: Left;   NOSE SURGERY  1970   PARATHYROIDECTOMY  03/29/2020   RHINOPLASTY     THYROID SURGERY  03/2020   parathyroidectomy   TONSILLECTOMY  1950   VAGINAL DELIVERY     x2   WEIL OSTEOTOMY Right 05/06/2023  Procedure: WEIL OSTEOTOMY FIFTH;  Surgeon: Rosetta Posner, DPM;  Location: [facility];  Service: Orthopedics/Podiatry;  Laterality: Right;    Current Outpatient Medications  Medication Sig Dispense Refill   ALPRAZolam (XANAX) 0.25 MG tablet TAKE 1 TABLET(0.25 MG) BY MOUTH DAILY AS NEEDED FOR ANXIETY 30 tablet 0   aspirin EC 81 MG tablet Take 4 tablets (325 mg total) by mouth  daily. Swallow whole.     Evolocumab (REPATHA SURECLICK) 140 MG/ML SOAJ Inject 140 mg into the skin every 14 (fourteen) days.     ezetimibe (ZETIA) 10 MG tablet TAKE 1 TABLET(10 MG) BY MOUTH DAILY 90 tablet 1   metoprolol succinate (TOPROL-XL) 25 MG 24 hr tablet TAKE 1 AND 1/2 TABLETS(37.5 MG) BY MOUTH DAILY 135 tablet 3   Specialty Vitamins Products (BIOTIN PLUS KERATIN) 10000-100 MCG-MG TABS Take by mouth daily.     Sulfur Sublimed POWD by Does not apply route daily.     TART CHERRY PO Take by mouth daily.     No current facility-administered medications for this visit.   Allergies:   Patient has no active allergies.   Social History:  The patient  reports that she has never smoked. She has never used smokeless tobacco. She reports current alcohol use of about 1.0 standard drink of alcohol per week. She reports that she does not use drugs.   Family History:   family history includes Asthma in her daughter; Cancer in an other family member; Dementia in her father and paternal grandfather; Diabetes in her daughter and father; Heart attack in her maternal grandfather; Heart attack (age of onset: 46) in her mother; Heart disease in her maternal grandfather; Heart failure in her father; Stroke in her paternal grandmother.    Review of Systems: Review of Systems  Constitutional: Negative.   HENT: Negative.    Respiratory: Negative.    Cardiovascular: Negative.   Gastrointestinal: Negative.   Musculoskeletal:  Positive for joint pain.  Neurological: Negative.   Psychiatric/Behavioral: Negative.    All other systems reviewed and are negative.   PHYSICAL EXAM: VS:  BP (!) 165/80 (BP Location: Left Arm)   Pulse 77   Ht 5' (1.524 m)   Wt 125 lb 4 oz (56.8 kg)   SpO2 97%   BMI 24.46 kg/m  , BMI Body mass index is 24.46 kg/m. Constitutional:  oriented to person, place, and time. No distress.  HENT:  Head: Grossly normal Eyes:  no discharge. No scleral icterus.  Neck: No JVD, no  carotid bruits  Cardiovascular: Regular rate and rhythm, no murmurs appreciated Pulmonary/Chest: Clear to auscultation bilaterally, no wheezes or rails Abdominal: Soft.  no distension.  no tenderness.  Musculoskeletal: Normal range of motion Neurological:  normal muscle tone. Coordination normal. No atrophy Skin: Skin warm and dry Psychiatric: normal affect, pleasant  Recent Labs: 11/02/2022: ALT 24; BUN 17; Creatinine, Ser 0.73; Potassium 4.2; Sodium 137; TSH 2.86    Lipid Panel Lab Results  Component Value Date   CHOL 237 (H) 11/02/2022   HDL 39.30 11/02/2022   LDLCALC 110 (H) 10/27/2021   TRIG 224.0 (H) 11/02/2022      Wt Readings from Last 3 Encounters:  10/11/23 125 lb 4 oz (56.8 kg)  05/06/23 118 lb (53.5 kg)  05/05/23 123 lb 12.8 oz (56.2 kg)     ASSESSMENT AND PLAN:  Mixed hyperlipidemia -  Unable to tolerate Crestor, simvastatin Reports that she took herself off Zetia, no side effects Stopped Repatha for period of time  thinking it was contributing to knee pain Given underlying coronary disease, PAD/aortic atherosclerosis, LDL above goal, recommend she try to stay on Repatha and restart Zetia We did talk about Leqvio  Coronary disease with stable angina Calcium score 100, denies anginal symptoms Cholesterol management as above  Essential hypertension - Markedly elevated blood pressure on today's visit, recommend close monitoring of pressure at home as she reports it is well-controlled Recommend she write down her blood pressure numbers and share with primary care next month  APCs, PVCs Asymptomatic  PAD, aortic atherosclerosis Mild aortic athero noted on CT scan 2018 Management of lipids as above   Orders Placed This Encounter  Procedures   EKG 12-Lead     Signed, Dossie Arbour, M.D., Ph.D. 10/11/2023  [facility] [place], [place] [phone]

## 2023-10-11 ENCOUNTER — Ambulatory Visit: Payer: PPO | Attending: Cardiovascular Disease | Admitting: Cardiovascular Disease

## 2023-10-11 ENCOUNTER — Encounter: Payer: Self-pay | Admitting: Cardiovascular Disease

## 2023-10-11 VITALS — BP 165/80 | HR 77 | Ht 60.0 in | Wt 125.2 lb

## 2023-10-11 DIAGNOSIS — E785 Hyperlipidemia, unspecified: Secondary | ICD-10-CM | POA: Diagnosis not present

## 2023-10-11 DIAGNOSIS — I10 Essential (primary) hypertension: Secondary | ICD-10-CM | POA: Diagnosis not present

## 2023-10-11 DIAGNOSIS — I2510 Atherosclerotic heart disease of native coronary artery without angina pectoris: Secondary | ICD-10-CM

## 2023-10-11 DIAGNOSIS — F419 Anxiety disorder, unspecified: Secondary | ICD-10-CM | POA: Diagnosis not present

## 2023-10-11 MED ORDER — REPATHA SURECLICK 140 MG/ML ~~LOC~~ SOAJ: 140.0000 mg | SUBCUTANEOUS | 6 refills | Status: DC

## 2023-10-11 NOTE — Patient Instructions (Addendum)
 Please monitor blood pressure   Medication Instructions:  No changes  If you need a refill on your cardiac medications before your next appointment, please call your pharmacy.   Lab work: No new labs needed  Testing/Procedures: No new testing needed  Follow-Up: At [facility], you and your health needs are our priority.  As part of our continuing mission to provide you with exceptional heart care, we have created designated Provider Care Teams.  These Care Teams include your primary Cardiologist (physician) and Advanced Practice Providers (APPs -  Physician Assistants and Nurse Practitioners) who all work together to provide you with the care you need, when you need it.  You will need a follow up appointment in 12 months  Providers on your designated Care Team:   Nicolasa Ducking, NP Eula Listen, PA-C Cadence Fransico Michael, [place]  COVID-19 Vaccine Information can be found at: [email or web address] For questions related to vaccine distribution or appointments, please email [email or web address] or call [phone].

## 2023-11-02 DIAGNOSIS — H2513 Age-related nuclear cataract, bilateral: Secondary | ICD-10-CM | POA: Diagnosis not present

## 2023-11-02 DIAGNOSIS — D3131 Benign neoplasm of right choroid: Secondary | ICD-10-CM | POA: Diagnosis not present

## 2023-11-02 DIAGNOSIS — H43813 Vitreous degeneration, bilateral: Secondary | ICD-10-CM | POA: Diagnosis not present

## 2023-11-02 DIAGNOSIS — H40003 Preglaucoma, unspecified, bilateral: Secondary | ICD-10-CM | POA: Diagnosis not present

## 2023-11-05 ENCOUNTER — Encounter: Payer: PPO | Admitting: Family

## 2023-11-09 ENCOUNTER — Ambulatory Visit: Payer: PPO | Admitting: Dermatology

## 2023-11-09 ENCOUNTER — Encounter: Payer: Self-pay | Admitting: Dermatology

## 2023-11-09 DIAGNOSIS — L820 Inflamed seborrheic keratosis: Secondary | ICD-10-CM | POA: Diagnosis not present

## 2023-11-09 DIAGNOSIS — L821 Other seborrheic keratosis: Secondary | ICD-10-CM | POA: Diagnosis not present

## 2023-11-09 DIAGNOSIS — L578 Other skin changes due to chronic exposure to nonionizing radiation: Secondary | ICD-10-CM

## 2023-11-09 DIAGNOSIS — L814 Other melanin hyperpigmentation: Secondary | ICD-10-CM

## 2023-11-09 DIAGNOSIS — W908XXA Exposure to other nonionizing radiation, initial encounter: Secondary | ICD-10-CM

## 2023-11-09 DIAGNOSIS — L570 Actinic keratosis: Secondary | ICD-10-CM | POA: Diagnosis not present

## 2023-11-09 DIAGNOSIS — L988 Other specified disorders of the skin and subcutaneous tissue: Secondary | ICD-10-CM

## 2023-11-09 NOTE — Progress Notes (Signed)
 Follow-Up Visit   Subjective  Melissa Turner is a 83 y.o. female who presents for the following: Botox for facial elastosis, check spots face and hands irritating  The following portions of the chart were reviewed this encounter and updated as appropriate: medications, allergies, medical history  Review of Systems:  No other skin or systemic complaints except as noted in HPI or Assessment and Plan.  Objective  Well appearing patient in no apparent distress; mood and affect are within normal limits.  A focused examination was performed of the face and hands.  Relevant physical exam findings are noted in the Assessment and Plan.  Injection map photo   R dorsum hand x 1, L cheek x 1 (2) Pink scaly macules face x 10 (10) Stuck on waxy paps with erythema  Assessment & Plan   ACTINIC DAMAGE - chronic, secondary to cumulative UV radiation exposure/sun exposure over time - diffuse scaly erythematous macules with underlying dyspigmentation - Recommend daily broad spectrum sunscreen SPF 30+ to sun-exposed areas, reapply every 2 hours as needed.  - Recommend staying in the shade or wearing long sleeves, sun glasses (UVA+UVB protection) and wide brim hats (4-inch brim around the entire circumference of the hat). - Call for new or changing lesions.   LENTIGINES Exam: scattered tan macules Due to sun exposure Treatment Plan: Benign-appearing, observe. Recommend daily broad spectrum sunscreen SPF 30+ to sun-exposed areas, reapply every 2 hours as needed.  Call for any changes  SEBORRHEIC KERATOSIS - Stuck-on, waxy, tan-brown papules and/or plaques  - Benign-appearing - Discussed benign etiology and prognosis. - Observe - Call for any changes  AK (ACTINIC KERATOSIS) (2) R dorsum hand x 1, L cheek x 1 (2) Actinic keratoses are precancerous spots that appear secondary to cumulative UV radiation exposure/sun exposure over time. They are chronic with expected duration over 1 year. A  portion of actinic keratoses will progress to squamous cell carcinoma of the skin. It is not possible to reliably predict which spots will progress to skin cancer and so treatment is recommended to prevent development of skin cancer.  Recommend daily broad spectrum sunscreen SPF 30+ to sun-exposed areas, reapply every 2 hours as needed.  Recommend staying in the shade or wearing long sleeves, sun glasses (UVA+UVB protection) and wide brim hats (4-inch brim around the entire circumference of the hat). Call for new or changing lesions. Destruction of lesion - R dorsum hand x 1, L cheek x 1 (2) Complexity: simple   Destruction method: cryotherapy   Informed consent: discussed and consent obtained   Timeout:  patient name, date of birth, surgical site, and procedure verified Lesion destroyed using liquid nitrogen: Yes   Region frozen until ice ball extended beyond lesion: Yes   Outcome: patient tolerated procedure well with no complications   Post-procedure details: wound care instructions given   INFLAMED SEBORRHEIC KERATOSIS (10) face x 10 (10) Symptomatic, irritating, patient would like treated. Destruction of lesion - face x 10 (10) Complexity: simple   Destruction method: cryotherapy   Informed consent: discussed and consent obtained   Timeout:  patient name, date of birth, surgical site, and procedure verified Lesion destroyed using liquid nitrogen: Yes   Region frozen until ice ball extended beyond lesion: Yes   Outcome: patient tolerated procedure well with no complications   Post-procedure details: wound care instructions given   ACTINIC SKIN DAMAGE   LENTIGO   SEBORRHEIC KERATOSIS   ELASTOSIS OF SKIN     Facial Elastosis Botox 40  units injected as marked:  - Frown complex 20 units - Crow's feet 10 units each for a total of 20 units  Location: frown complex, crow's feet  Informed consent: Discussed risks (infection, pain, bleeding, bruising, swelling, allergic  reaction, paralysis of nearby muscles, eyelid droop, double vision, neck weakness, difficulty breathing, headache, undesirable cosmetic result, and need for additional treatment) and benefits of the procedure, as well as the alternatives.  Informed consent was obtained.  Preparation: The area was cleansed with alcohol.  Procedure Details:  Botox was injected into the dermis with a 30-gauge needle. Pressure applied to any bleeding. Ice packs offered for swelling.  Lot Number:  [id number] Expiration:  09/2025  Total Units Injected:  40  Plan: Tylenol may be used for headache.  Allow 2 weeks before returning to clinic for additional dosing as needed. Patient will call for any problems.  Return for 3-32m Botox.  I, Ardis Rowan, RMA, am acting as scribe for Armida Sans, MD .   Documentation: I have reviewed the above documentation for accuracy and completeness, and I agree with the above.  Armida Sans, MD

## 2023-11-09 NOTE — Patient Instructions (Signed)

## 2023-11-17 ENCOUNTER — Encounter: Payer: Self-pay | Admitting: Family

## 2023-11-17 ENCOUNTER — Ambulatory Visit (INDEPENDENT_AMBULATORY_CARE_PROVIDER_SITE_OTHER): Payer: PPO | Admitting: Family

## 2023-11-17 VITALS — BP 142/78 | HR 88 | Temp 97.8°F | Ht 62.0 in | Wt 124.4 lb

## 2023-11-17 DIAGNOSIS — I2510 Atherosclerotic heart disease of native coronary artery without angina pectoris: Secondary | ICD-10-CM

## 2023-11-17 DIAGNOSIS — Z136 Encounter for screening for cardiovascular disorders: Secondary | ICD-10-CM | POA: Diagnosis not present

## 2023-11-17 DIAGNOSIS — Z13220 Encounter for screening for lipoid disorders: Secondary | ICD-10-CM

## 2023-11-17 DIAGNOSIS — I10 Essential (primary) hypertension: Secondary | ICD-10-CM | POA: Diagnosis not present

## 2023-11-17 DIAGNOSIS — E785 Hyperlipidemia, unspecified: Secondary | ICD-10-CM | POA: Diagnosis not present

## 2023-11-17 DIAGNOSIS — Z87898 Personal history of other specified conditions: Secondary | ICD-10-CM | POA: Diagnosis not present

## 2023-11-17 DIAGNOSIS — M791 Myalgia, unspecified site: Secondary | ICD-10-CM | POA: Diagnosis not present

## 2023-11-17 DIAGNOSIS — Z0000 Encounter for general adult medical examination without abnormal findings: Secondary | ICD-10-CM

## 2023-11-17 DIAGNOSIS — Z780 Asymptomatic menopausal state: Secondary | ICD-10-CM

## 2023-11-17 DIAGNOSIS — Z8639 Personal history of other endocrine, nutritional and metabolic disease: Secondary | ICD-10-CM

## 2023-11-17 LAB — COMPREHENSIVE METABOLIC PANEL WITH GFR
ALT: 20 U/L (ref 0–35)
AST: 23 U/L (ref 0–37)
Albumin: 4.9 g/dL (ref 3.5–5.2)
Alkaline Phosphatase: 79 U/L (ref 39–117)
BUN: 14 mg/dL (ref 6–23)
CO2: 29 meq/L (ref 19–32)
Calcium: 9.7 mg/dL (ref 8.4–10.5)
Chloride: 100 meq/L (ref 96–112)
Creatinine, Ser: 0.69 mg/dL (ref 0.40–1.20)
GFR: 81.73 mL/min (ref >60.00)
Glucose, Bld: 95 mg/dL (ref 70–99)
Potassium: 3.9 meq/L (ref 3.5–5.1)
Sodium: 138 meq/L (ref 135–145)
Total Bilirubin: 0.8 mg/dL (ref 0.2–1.2)
Total Protein: 7.6 g/dL (ref 6.0–8.3)

## 2023-11-17 LAB — CBC WITH DIFFERENTIAL/PLATELET
Basophils Absolute: 0 10*3/uL (ref 0.0–0.1)
Basophils Relative: 0.6 % (ref 0.0–3.0)
Eosinophils Absolute: 0.2 10*3/uL (ref 0.0–0.7)
Eosinophils Relative: 2.3 % (ref 0.0–5.0)
HCT: 44.4 % (ref 36.0–46.0)
Hemoglobin: 15.2 g/dL — ABNORMAL HIGH (ref 12.0–15.0)
Lymphocytes Relative: 21.9 % (ref 12.0–46.0)
Lymphs Abs: 1.6 10*3/uL (ref 0.7–4.0)
MCHC: 34.2 g/dL (ref 30.0–36.0)
MCV: 95.9 fl (ref 78.0–100.0)
Monocytes Absolute: 0.7 10*3/uL (ref 0.1–1.0)
Monocytes Relative: 9.3 % (ref 3.0–12.0)
Neutro Abs: 4.8 10*3/uL (ref 1.4–7.7)
Neutrophils Relative %: 65.9 % (ref 43.0–77.0)
Platelets: 287 10*3/uL (ref 150.0–400.0)
RBC: 4.63 Mil/uL (ref 3.87–5.11)
RDW: 12.8 % (ref 11.5–15.5)
WBC: 7.3 10*3/uL (ref 4.0–10.5)

## 2023-11-17 LAB — LIPID PANEL
Cholesterol: 151 mg/dL (ref 0–200)
HDL: 43.4 mg/dL (ref >39.00)
LDL Cholesterol: 82 mg/dL (ref 0–99)
NonHDL: 107.16
Total CHOL/HDL Ratio: 3
Triglycerides: 127 mg/dL (ref 0.0–149.0)
VLDL: 25.4 mg/dL (ref 0.0–40.0)

## 2023-11-17 LAB — HEMOGLOBIN A1C: Hgb A1c MFr Bld: 5.8 % (ref 4.6–6.5)

## 2023-11-17 NOTE — Assessment & Plan Note (Signed)
 Clinical breast exam performed today.  Deferred pelvic exam in the absence of complaints and patient no longer screening for cervical cancer.  She will schedule bone density

## 2023-11-17 NOTE — Progress Notes (Signed)
 Assessment & Plan:  Routine general medical examination at a health care facility Assessment & Plan: Clinical breast exam performed today.  Deferred pelvic exam in the absence of complaints and patient no longer screening for cervical cancer.  She will schedule bone density   Myalgia Assessment & Plan: Discussed chronic pain.  Exam reassuring without symptoms suggestive of PAD however history of CAD.  I have ordered ABI, left leg duplex.  Discussed scheduling Tylenol arthritis.  Advised to stop aspirin 325 mg for pain. Continue 81mg  ASA every day.  If pain improves with above managment, I do not think related to Repatha ; advised patient to resume.  Otherwise patient follow up with cardiology for discussion of Leqvio.  Discussed treatment follow-up with orthopedics for ongoing left knee pain. Consider XR lumbar spine if pain persists.   Orders: -     US ARTERIAL ABI (SCREENING LOWER EXTREMITY); Future -     CBC with Differential/Platelet -     Comprehensive metabolic panel -     US Venous Img Lower Unilateral Left (DVT); Future  Coronary artery calcification  Primary hypertension -     TSH  Hyperlipidemia, unspecified hyperlipidemia type  Asymptomatic postmenopausal state -     DG Bone Density; Future  History of vitamin D deficiency -     VITAMIN D 25 Hydroxy (Vit-D Deficiency, Fractures)  Encounter for lipid screening for cardiovascular disease -     Lipid panel  History of prediabetes -     Hemoglobin A1c     Return precautions given.   Risks, benefits, and alternatives of the medications and treatment plan prescribed today were discussed, and patient expressed understanding.   Education regarding symptom management and diagnosis given to patient on AVS either electronically or printed.  No follow-ups on file.  Rennie Plowman, FNP  Subjective:    Patient ID: Melissa Turner, female    DOB: Jun 27, 1941, 83 y.o.   MRN: [id number]  CC: Melissa Turner is a 83 y.o.  female who presents today for physical exam.    HPI: She complains of chronic left knee pain.   In general , she 'feels tired from the waist down'.   She reports left lower extremity subtle swelling for years.  This is not unusual for her.  No recent travel, immobilization  She has been taking asa 325mg  BID with relief for the past couple of days. 'Better than I have felt in a long time '  Denies orthopnea, intermittent claudication, leg swelling, CP, SOB      She stopped Repatha due to myalgia however pain is still present so she is not sure if aggravated the pain. Compliant with zetia 10mg  every day.   Last visit with Dr. 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  Recommended she try to stay on Repatha and restart Zetia. Discussed leqvio She continues to follow with Dr. Micheline Chapman, dermatology Colorectal Cancer Screening: UTD , no longer screening, Last colonoscopy 02/18/22 Breast Cancer Screening: Mammogram UTD Cervical Cancer Screening: no longer screening for cervical cancer.   Bone Health screening/DEXA for 65+:due Lung Cancer Screening: Doesn't have 20 year pack year history and age > 19 years yo 29 years        Tetanus - UTD      Exercise: Gets regular exercise , limiited d/t left knee.  Alcohol use:  one glass of wine per week Smoking/tobacco use: Nonsmoker.    Health Maintenance  Topic Date Due   COVID-19 Vaccine (1) Never done  Zoster (Shingles) Vaccine (1 of 2) Never done   Medicare Annual Wellness Visit  01/13/2024   Flu Shot  11/22/2023*   Colon Cancer Screening  02/19/2027   DTaP/Tdap/Td vaccine (2 - Td or Tdap) 03/12/2027   Pneumonia Vaccine  Completed   DEXA scan (bone density measurement)  Completed   HPV Vaccine  Aged Out   Hepatitis C Screening  Discontinued  *Topic was postponed. The date shown is not the original due date.    ALLERGIES: Patient has no active allergies.  Current Outpatient Medications on File Prior to Visit  Medication Sig  Dispense Refill   ALPRAZolam (XANAX) 0.25 MG tablet TAKE 1 TABLET(0.25 MG) BY MOUTH DAILY AS NEEDED FOR ANXIETY 30 tablet 0   aspirin EC 81 MG tablet Take 4 tablets (325 mg total) by mouth daily. Swallow whole.     ezetimibe (ZETIA) 10 MG tablet TAKE 1 TABLET(10 MG) BY MOUTH DAILY 90 tablet 1   metoprolol succinate (TOPROL-XL) 25 MG 24 hr tablet TAKE 1 AND 1/2 TABLETS(37.5 MG) BY MOUTH DAILY 135 tablet 3   Specialty Vitamins Products (BIOTIN PLUS KERATIN) 10000-100 MCG-MG TABS Take by mouth daily.     Evolocumab (REPATHA SURECLICK) 140 MG/ML SOAJ Inject 140 mg into the skin every 14 (fourteen) days. (Patient not taking: Reported on 11/17/2023) 2 mL 6   Sulfur Sublimed POWD by Does not apply route daily. (Patient not taking: Reported on 11/17/2023)     TART CHERRY PO Take by mouth daily. (Patient not taking: Reported on 11/17/2023)     No current facility-administered medications on file prior to visit.    Review of Systems  Constitutional:  Negative for chills and fever.  Respiratory:  Negative for cough and shortness of breath.   Cardiovascular:  Positive for leg swelling. Negative for chest pain and palpitations.  Gastrointestinal:  Negative for nausea and vomiting.      Objective:    BP (!) 142/78   Pulse 88   Temp 97.8 F (36.6 C) (Oral)   Ht 5\' 2"  (1.575 m)   Wt 124 lb 6.4 oz (56.4 kg)   SpO2 98%   BMI 22.75 kg/m   BP Readings from Last 3 Encounters:  11/17/23 (!) 142/78  10/11/23 (!) 165/80  05/06/23 (!) 156/80   Wt Readings from Last 3 Encounters:  11/17/23 124 lb 6.4 oz (56.4 kg)  10/11/23 125 lb 4 oz (56.8 kg)  05/06/23 118 lb (53.5 kg)    Physical Exam Vitals reviewed.  Constitutional:      Appearance: Normal appearance. She is well-developed.  Eyes:     Conjunctiva/sclera: Conjunctivae normal.  Neck:     Thyroid: No thyroid mass or thyromegaly.  Cardiovascular:     Rate and Rhythm: Normal rate and regular rhythm.     Pulses: Normal pulses.     Heart  sounds: Normal heart sounds.     Comments: Trace left non pitting edema around sock.  No RLE edema.  Bilateral legs without palpable cords or masses. No erythema or increased warmth. No asymmetry in calf size when compared bilaterally LE hair growth symmetric and present. No discoloration or varicosities noted. LE warm and brisk, palpable pedal pulses.  Pulmonary:     Effort: Pulmonary effort is normal.     Breath sounds: Normal breath sounds. No wheezing, rhonchi or rales.  Chest:  Breasts:    Breasts are symmetrical.     Right: No inverted nipple, mass, nipple discharge, skin change or tenderness.  Left: No inverted nipple, mass, nipple discharge, skin change or tenderness.  Abdominal:     General: Bowel sounds are normal. There is no distension.     Palpations: Abdomen is soft. Abdomen is not rigid. There is no fluid wave or mass.     Tenderness: There is no abdominal tenderness. There is no guarding or rebound.  Lymphadenopathy:     Head:     Right side of head: No submental, submandibular, tonsillar, preauricular, posterior auricular or occipital adenopathy.     Left side of head: No submental, submandibular, tonsillar, preauricular, posterior auricular or occipital adenopathy.     Cervical: No cervical adenopathy.     Right cervical: No superficial, deep or posterior cervical adenopathy.    Left cervical: No superficial, deep or posterior cervical adenopathy.  Skin:    General: Skin is warm and dry.  Neurological:     Mental Status: She is alert.  Psychiatric:        Speech: Speech normal.        Behavior: Behavior normal.        Thought Content: Thought content normal.

## 2023-11-17 NOTE — Assessment & Plan Note (Addendum)
 Discussed chronic pain.  Exam reassuring without symptoms suggestive of PAD however history of CAD.  I have ordered ABI, left leg duplex.  Discussed scheduling Tylenol arthritis.  Advised to stop aspirin 325 mg for pain. Continue 81mg  ASA every day.  If pain improves with above managment, I do not think related to Repatha ; advised patient to resume.  Otherwise patient follow up with cardiology for discussion of Leqvio.  Discussed treatment follow-up with orthopedics for ongoing left knee pain. Consider XR lumbar spine if pain persists.

## 2023-11-17 NOTE — Patient Instructions (Addendum)
 Stop asa 325mg  twice daily  I have ordered two different utrasounds of your legs.    Let us know if you dont hear back within a week in regards to an appointment being scheduled.   So that you are aware, if you are [facility] MyChart user , please pay attention to your MyChart messages as you may receive a MyChart message with a phone number to call and schedule this test/appointment own your own from our referral coordinator. This is a new process so I do not want you to miss this message.  If you are not a MyChart user, you will receive a phone call.    If you feel repatha is source of joint pain, please let Dr Mariah Milling know so he can discuss Leqvio with you.    Please call and schedule follow up for left knee with Dr Nyoka Lint.    Please call  and schedule your 3D mammogram and /or bone density scan as we discussed.   [facility]  ( new location in 2023)  [address], [place], [place] [address]  [place], [place]  [phone]   As discussed, let's start by scheduling Tylenol Arthritis which is a 650mg  tablet .   You may take 1-2 tablets every 8 hours ( scheduled) with maximum of 6 tablets per day. Most adults can safely take 4 pills total per day of Tylenol Arthritis 650mg  tablet. Do not exceed 6 tablets a day of Tylenol Arthritis 650mg  tablet   For example , you could take two tablets in the morning ( 8am) and then two tablets again at 4pm.   Maximum daily dose of acetaminophen 4 g per day from all sources.  If you are taking another medication which includes acetaminophen (Tylenol) which may be in cough and cold preparations or pain medication such as Percocet, you will need to factor that into your total daily dose to be safe.  Please let me know if any questions  A great article regarding how to safely take and dose tylenol found below.  Title : 'Acetaminophen safety: Be cautious but not  afraid'  https://www.health.https://gentry.org/  Health Maintenance for Postmenopausal Women Menopause is a normal process in which your ability to get pregnant comes to an end. This process happens slowly over many months or years, usually between the ages of 93 and 42. Menopause is complete when you have missed your menstrual period for 12 months. It is important to talk with your health care provider about some of the most common conditions that affect women after menopause (postmenopausal women). These include heart disease, cancer, and bone loss (osteoporosis). Adopting a healthy lifestyle and getting preventive care can help to promote your health and wellness. The actions you take can also lower your chances of developing some of these common conditions. What are the signs and symptoms of menopause? During menopause, you may have the following symptoms: Hot flashes. These can be moderate or severe. Night sweats. Decrease in sex drive. Mood swings. Headaches. Tiredness (fatigue). Irritability. Memory problems. Problems falling asleep or staying asleep. Talk with your health care provider about treatment options for your symptoms. Do I need hormone replacement therapy? Hormone replacement therapy is effective in treating symptoms that are caused by menopause, such as hot flashes and night sweats. Hormone replacement carries certain risks, especially as you become older. If you are thinking about using estrogen or estrogen with progestin, discuss the benefits and risks with your health care provider. How can I reduce  my risk for heart disease and stroke? The risk of heart disease, heart attack, and stroke increases as you age. One of the causes may be a change in the body's hormones during menopause. This can affect how your body uses dietary fats, triglycerides, and cholesterol. Heart attack and stroke are medical emergencies. There are many  things that you can do to help prevent heart disease and stroke. Watch your blood pressure High blood pressure causes heart disease and increases the risk of stroke. This is more likely to develop in people who have high blood pressure readings or are overweight. Have your blood pressure checked: Every 3-5 years if you are 32-52 years of age. Every year if you are 73 years old or older. Eat a healthy diet  Eat a diet that includes plenty of vegetables, fruits, low-fat dairy products, and lean protein. Do not eat a lot of foods that are high in solid fats, added sugars, or sodium. Get regular exercise Get regular exercise. This is one of the most important things you can do for your health. Most adults should: Try to exercise for at least 150 minutes each week. The exercise should increase your heart rate and make you sweat (moderate-intensity exercise). Try to do strengthening exercises at least twice each week. Do these in addition to the moderate-intensity exercise. Spend less time sitting. Even light physical activity can be beneficial. Other tips Work with your health care provider to achieve or maintain a healthy weight. Do not use any products that contain nicotine or tobacco. These products include cigarettes, chewing tobacco, and vaping devices, such as e-cigarettes. If you need help quitting, ask your health care provider. Know your numbers. Ask your health care provider to check your cholesterol and your blood sugar (glucose). Continue to have your blood tested as directed by your health care provider. Do I need screening for cancer? Depending on your health history and family history, you may need to have cancer screenings at different stages of your life. This may include screening for: Breast cancer. Cervical cancer. Lung cancer. Colorectal cancer. What is my risk for osteoporosis? After menopause, you may be at increased risk for osteoporosis. Osteoporosis is a condition in  which bone destruction happens more quickly than new bone creation. To help prevent osteoporosis or the bone fractures that can happen because of osteoporosis, you may take the following actions: If you are 12-74 years old, get at least 1,000 mg of calcium and at least 600 international units (IU) of vitamin D per day. If you are older than age 70 but younger than age 25, get at least 1,200 mg of calcium and at least 600 international units (IU) of vitamin D per day. If you are older than age 57, get at least 1,200 mg of calcium and at least 800 international units (IU) of vitamin D per day. Smoking and drinking excessive alcohol increase the risk of osteoporosis. Eat foods that are rich in calcium and vitamin D, and do weight-bearing exercises several times each week as directed by your health care provider. How does menopause affect my mental health? Depression may occur at any age, but it is more common as you become older. Common symptoms of depression include: Feeling depressed. Changes in sleep patterns. Changes in appetite or eating patterns. Feeling an overall lack of motivation or enjoyment of activities that you previously enjoyed. Frequent crying spells. Talk with your health care provider if you think that you are experiencing any of these symptoms. General instructions  See your health care provider for regular wellness exams and vaccines. This may include: Scheduling regular health, dental, and eye exams. Getting and maintaining your vaccines. These include: Influenza vaccine. Get this vaccine each year before the flu season begins. Pneumonia vaccine. Shingles vaccine. Tetanus, diphtheria, and pertussis (Tdap) booster vaccine. Your health care provider may also recommend other immunizations. Tell your health care provider if you have ever been abused or do not feel safe at home. Summary Menopause is a normal process in which your ability to get pregnant comes to an end. This  condition causes hot flashes, night sweats, decreased interest in sex, mood swings, headaches, or lack of sleep. Treatment for this condition may include hormone replacement therapy. Take actions to keep yourself healthy, including exercising regularly, eating a healthy diet, watching your weight, and checking your blood pressure and blood sugar levels. Get screened for cancer and depression. Make sure that you are up to date with all your vaccines. This information is not intended to replace advice given to you by your health care provider. Make sure you discuss any questions you have with your health care provider. Document Revised: 12/30/2020 Document Reviewed: 12/30/2020 Elsevier Patient Education  2024 [employer].

## 2023-11-18 ENCOUNTER — Telehealth: Payer: Self-pay | Admitting: Family

## 2023-11-18 LAB — TSH: TSH: 3.76 u[IU]/mL (ref 0.35–5.50)

## 2023-11-18 LAB — VITAMIN D 25 HYDROXY (VIT D DEFICIENCY, FRACTURES): VITD: 41.83 ng/mL (ref 30.00–100.00)

## 2023-11-18 NOTE — Telephone Encounter (Signed)
Lft pt a vm to call ofc to [facility] [place]. thanks

## 2023-11-19 ENCOUNTER — Ambulatory Visit
Admission: RE | Admit: 2023-11-19 | Discharge: 2023-11-19 | Disposition: A | Discharge status: Home / Self Care | Source: Ambulatory Visit | Attending: Family | Admitting: Family

## 2023-11-19 DIAGNOSIS — M7989 Other specified soft tissue disorders: Secondary | ICD-10-CM | POA: Diagnosis not present

## 2023-11-19 DIAGNOSIS — M791 Myalgia, unspecified site: Secondary | ICD-10-CM | POA: Diagnosis not present

## 2023-11-19 DIAGNOSIS — M79605 Pain in left leg: Secondary | ICD-10-CM | POA: Diagnosis not present

## 2023-11-22 ENCOUNTER — Encounter: Payer: Self-pay | Admitting: Family

## 2023-11-22 ENCOUNTER — Telehealth: Payer: Self-pay | Admitting: Family

## 2023-11-22 NOTE — Telephone Encounter (Signed)
 No vm set up. Thanks

## 2023-11-23 ENCOUNTER — Telehealth: Payer: Self-pay | Admitting: Family

## 2023-11-23 NOTE — Telephone Encounter (Signed)
 Lft pt vm to call ofc to [facility] [place]. thanks ?

## 2023-11-26 ENCOUNTER — Other Ambulatory Visit: Payer: Self-pay | Admitting: Family

## 2023-11-26 ENCOUNTER — Encounter: Payer: Self-pay | Admitting: Family

## 2023-11-26 DIAGNOSIS — D582 Other hemoglobinopathies: Secondary | ICD-10-CM

## 2023-11-30 DIAGNOSIS — S93401A Sprain of unspecified ligament of right ankle, initial encounter: Secondary | ICD-10-CM | POA: Diagnosis not present

## 2023-11-30 DIAGNOSIS — S46911A Strain of unspecified muscle, fascia and tendon at shoulder and upper arm level, right arm, initial encounter: Secondary | ICD-10-CM | POA: Diagnosis not present

## 2023-12-07 ENCOUNTER — Ambulatory Visit
Admission: RE | Admit: 2023-12-07 | Discharge: 2023-12-07 | Disposition: A | Discharge status: Home / Self Care | Source: Ambulatory Visit | Attending: Family | Admitting: Family

## 2023-12-07 DIAGNOSIS — I10 Essential (primary) hypertension: Secondary | ICD-10-CM | POA: Diagnosis not present

## 2023-12-07 DIAGNOSIS — M791 Myalgia, unspecified site: Secondary | ICD-10-CM | POA: Diagnosis not present

## 2023-12-07 DIAGNOSIS — I739 Peripheral vascular disease, unspecified: Secondary | ICD-10-CM | POA: Diagnosis not present

## 2023-12-07 DIAGNOSIS — E785 Hyperlipidemia, unspecified: Secondary | ICD-10-CM | POA: Diagnosis not present

## 2023-12-13 ENCOUNTER — Encounter: Payer: Self-pay | Admitting: Family

## 2024-01-24 ENCOUNTER — Other Ambulatory Visit (INDEPENDENT_AMBULATORY_CARE_PROVIDER_SITE_OTHER)

## 2024-01-24 DIAGNOSIS — D582 Other hemoglobinopathies: Secondary | ICD-10-CM | POA: Diagnosis not present

## 2024-01-24 LAB — CBC WITH DIFFERENTIAL/PLATELET
Basophils Absolute: 0 10*3/uL (ref 0.0–0.1)
Basophils Relative: 0.7 % (ref 0.0–3.0)
Eosinophils Absolute: 0.2 10*3/uL (ref 0.0–0.7)
Eosinophils Relative: 2.5 % (ref 0.0–5.0)
HCT: 43.4 % (ref 36.0–46.0)
Hemoglobin: 15 g/dL (ref 12.0–15.0)
Lymphocytes Relative: 26.4 % (ref 12.0–46.0)
Lymphs Abs: 1.6 10*3/uL (ref 0.7–4.0)
MCHC: 34.6 g/dL (ref 30.0–36.0)
MCV: 94.1 fl (ref 78.0–100.0)
Monocytes Absolute: 0.6 10*3/uL (ref 0.1–1.0)
Monocytes Relative: 10.5 % (ref 3.0–12.0)
Neutro Abs: 3.7 10*3/uL (ref 1.4–7.7)
Neutrophils Relative %: 59.9 % (ref 43.0–77.0)
Platelets: 252 10*3/uL (ref 150.0–400.0)
RBC: 4.61 Mil/uL (ref 3.87–5.11)
RDW: 12.4 % (ref 11.5–15.5)
WBC: 6.1 10*3/uL (ref 4.0–10.5)

## 2024-01-25 LAB — JAK2 V617F, REFLEX TO E12-15

## 2024-01-26 ENCOUNTER — Other Ambulatory Visit: Payer: Self-pay | Admitting: Family

## 2024-01-26 DIAGNOSIS — M7541 Impingement syndrome of right shoulder: Secondary | ICD-10-CM | POA: Diagnosis not present

## 2024-01-26 DIAGNOSIS — M1712 Unilateral primary osteoarthritis, left knee: Secondary | ICD-10-CM | POA: Diagnosis not present

## 2024-01-26 DIAGNOSIS — I10 Essential (primary) hypertension: Secondary | ICD-10-CM

## 2024-01-27 LAB — ERYTHROPOIETIN: Erythropoietin: 10.1 m[IU]/mL (ref 2.6–18.5)

## 2024-01-27 LAB — CARBOXYHEMOGLOBIN: Carboxyhemoglobin: 5 %{Hb}

## 2024-01-31 LAB — JAK2 V617F, REFLEX TO E12-15

## 2024-01-31 LAB — E12-E15

## 2024-01-31 LAB — SPECIMEN STATUS REPORT

## 2024-02-01 ENCOUNTER — Ambulatory Visit: Payer: Self-pay | Admitting: Family

## 2024-02-09 DIAGNOSIS — M25562 Pain in left knee: Secondary | ICD-10-CM | POA: Diagnosis not present

## 2024-02-09 DIAGNOSIS — M25511 Pain in right shoulder: Secondary | ICD-10-CM | POA: Diagnosis not present

## 2024-03-07 ENCOUNTER — Ambulatory Visit: Admitting: Dermatology

## 2024-03-22 ENCOUNTER — Other Ambulatory Visit: Payer: Self-pay | Admitting: Family

## 2024-03-22 DIAGNOSIS — I2510 Atherosclerotic heart disease of native coronary artery without angina pectoris: Secondary | ICD-10-CM

## 2024-04-08 ENCOUNTER — Other Ambulatory Visit: Payer: Self-pay | Admitting: Cardiovascular Disease

## 2024-04-08 DIAGNOSIS — E785 Hyperlipidemia, unspecified: Secondary | ICD-10-CM

## 2024-04-08 DIAGNOSIS — I2510 Atherosclerotic heart disease of native coronary artery without angina pectoris: Secondary | ICD-10-CM

## 2024-04-11 ENCOUNTER — Telehealth: Payer: Self-pay | Admitting: Cardiovascular Disease

## 2024-04-11 ENCOUNTER — Other Ambulatory Visit (HOSPITAL_COMMUNITY): Payer: Self-pay

## 2024-04-11 DIAGNOSIS — I2510 Atherosclerotic heart disease of native coronary artery without angina pectoris: Secondary | ICD-10-CM

## 2024-04-11 DIAGNOSIS — E785 Hyperlipidemia, unspecified: Secondary | ICD-10-CM

## 2024-04-11 NOTE — Telephone Encounter (Signed)
 Spoke with [facility] and they stated the prescription would not go through insurance because it needs prior [place]. Will route to our auth team for further assistance.

## 2024-04-11 NOTE — Telephone Encounter (Signed)
 Called and was unable to reach person who called in regards to medication. Will reach out to local pharmacy

## 2024-04-11 NOTE — Telephone Encounter (Signed)
 Pharmacy Patient Advocate Encounter   Received notification from Physician's Office that prior authorization for REPATHA  is required/requested.   Insurance verification completed.   The patient is insured through [facility] ADV[employer]AGE/RX ADVANCE .   Per test claim: PA required; PA submitted to above mentioned insurance via Latent Key/confirmation #/[id number] Status is pending

## 2024-04-11 NOTE — Telephone Encounter (Signed)
 PA has been submitted. Request is pending. We are waiting on insurance to respond.

## 2024-04-11 NOTE — Telephone Encounter (Signed)
 Pt c/o medication issue:  1. Name of Medication:   Evolocumab  (REPATHA  SURECLICK) 140 MG/ML SOAJ    2. How are you currently taking this medication (dosage and times per day)? As written   3. Are you having a reaction (difficulty breathing--STAT)? No   4. What is your medication issue? Pharmacy called in stating this medication needs prior auth.

## 2024-04-11 NOTE — Telephone Encounter (Signed)
 [facility] w/ [employer] is calling for confirmation on quantity of Repatha . They have received it as 6 for 30 day supply. However, the plan is allowing 2 for 28 day supply or 6 for 84 day supply. Call back # is [phone], opt 2.

## 2024-04-11 NOTE — Telephone Encounter (Addendum)
 Sounds like they are referring to a prescription pharmacy may have attempted to fill, not the actual PA request. I have already requested PA for 6 mL per 84 days (2 mL per 28 days)  PA is still pending in Latent. KEY: [id number]

## 2024-04-12 ENCOUNTER — Ambulatory Visit (INDEPENDENT_AMBULATORY_CARE_PROVIDER_SITE_OTHER): Admitting: *Deleted

## 2024-04-12 VITALS — Ht 60.0 in | Wt 123.0 lb

## 2024-04-12 DIAGNOSIS — Z1231 Encounter for screening mammogram for malignant neoplasm of breast: Secondary | ICD-10-CM | POA: Diagnosis not present

## 2024-04-12 DIAGNOSIS — Z0000 Encounter for general adult medical examination without abnormal findings: Secondary | ICD-10-CM | POA: Diagnosis not present

## 2024-04-12 NOTE — Progress Notes (Signed)
 Subjective:   Melissa Turner is a 83 y.o. who presents for a Medicare Wellness preventive visit.  As a reminder, Annual Wellness Visits don't include a physical exam, and some assessments may be limited, especially if this visit is performed virtually. We may recommend an in-person follow-up visit with your provider if needed.  Visit Complete: Virtual I connected with  Melissa Turner on 04/12/24 by a audio enabled telemedicine application and verified that I am speaking with the correct person using two identifiers.  Patient Location: Home  Provider Location: Home Office  I discussed the limitations of evaluation and management by telemedicine. The patient expressed understanding and agreed to proceed.  Vital Signs: Because this visit was a virtual/telehealth visit, some criteria may be missing or patient reported. Any vitals not documented were not able to be obtained and vitals that have been documented are patient reported.  VideoDeclined- This patient declined Librarian, academic. Therefore the visit was completed with audio only.  Persons Participating in Visit: Patient.  AWV Questionnaire: No: Patient Medicare AWV questionnaire was not completed prior to this visit.  Cardiac Risk Factors include: advanced age (>42men, >81 women);dyslipidemia;hypertension     Objective:    Today's Vitals   04/12/24 0847  Weight: 123 lb (55.8 kg)  Height: 5' (1.524 m)   Body mass index is 24.02 kg/m.     04/12/2024    9:07 AM 05/06/2023    6:32 AM 01/13/2023    8:48 AM 02/18/2022   10:37 AM 12/15/2021   10:37 AM 12/13/2020   10:34 AM 10/05/2018    5:50 PM  Advanced Directives  Does Patient Have a Medical Advance Directive? Yes Yes Yes Yes Yes Yes No   Type of Estate agent of [place];Living will Healthcare Power of [place];Living will Healthcare Power of [place];Living will Living will Healthcare Power of [place];Living will  Healthcare Power of [place];Living will   Does patient want to make changes to medical advance directive?  No - Patient declined No - Patient declined  No - Patient declined No - Patient declined   Copy of Healthcare Power of Attorney in Chart? No - copy requested No - copy requested   No - copy requested No - copy requested   Would patient like information on creating a medical advance directive?       No - Patient declined      Data saved with a previous flowsheet row definition    Current Medications (verified) Outpatient Encounter Medications as of 04/12/2024  Medication Sig   ALPRAZolam  (XANAX ) 0.25 MG tablet TAKE 1 TABLET(0.25 MG) BY MOUTH DAILY AS NEEDED FOR ANXIETY   aspirin  EC 81 MG tablet Take 4 tablets (325 mg total) by mouth daily. Swallow whole. (Patient taking differently: Take 81 mg by mouth daily. Swallow whole.)   ezetimibe  (ZETIA ) 10 MG tablet TAKE 1 TABLET(10 MG) BY MOUTH DAILY   metoprolol  succinate (TOPROL -XL) 25 MG 24 hr tablet TAKE 1 AND 1/2 TABLETS(37.5 MG) BY MOUTH DAILY   Evolocumab  (REPATHA  SURECLICK) 140 MG/ML SOAJ ADMINISTER 1 ML UNDER THE SKIN EVERY 14 DAYS (Patient not taking: Reported on 04/12/2024)   Specialty Vitamins Products (BIOTIN PLUS KERATIN) 10000-100 MCG-MG TABS Take by mouth daily. (Patient not taking: Reported on 04/12/2024)   Sulfur Sublimed POWD by Does not apply route daily. (Patient not taking: Reported on 04/12/2024)   TART CHERRY PO Take by mouth daily. (Patient not taking: Reported on 04/12/2024)   No facility-administered encounter medications  on file as of 04/12/2024.    Allergies (verified) Patient has no active allergies.   History: Past Medical History:  Diagnosis Date   CAD (coronary artery disease)    Deviated septum    s/p rhinoplasty   GERD (gastroesophageal reflux disease)    History of blood transfusion    during first child's birth   History of chicken pox    HTN (hypertension)    Hypercholesteremia    Hyperlipidemia     Migraines    Primary hyperparathyroidism (HCC)    Past Surgical History:  Procedure Laterality Date   ABDOMINAL HYSTERECTOMY  11/2017   partial hysterectomy; due to bladder prolapse. has bilateral ovaries.    BREAST CYST ASPIRATION Left    BUNIONECTOMY     CHEILECTOMY Right 05/06/2023   Procedure: CHEILECTOMY;  Surgeon: Lennie Barter, DPM;  Location: [facility];  Service: Orthopedics/Podiatry;  Laterality: Right;   COLONOSCOPY     07/12/2006, 11/12/2016   COLONOSCOPY N/A 02/18/2022   Procedure: COLONOSCOPY;  Surgeon: Toledo, Ladell POUR, MD;  Location: [facility];  Service: Gastroenterology;  Laterality: N/A;   DILATION AND CURETTAGE OF UTERUS     ear lobe revision     ESOPHAGOGASTRODUODENOSCOPY N/A 02/18/2022   Procedure: ESOPHAGOGASTRODUODENOSCOPY (EGD);  Surgeon: Toledo, Ladell POUR, MD;  Location: [facility];  Service: Gastroenterology;  Laterality: N/A;   MINOR HARDWARE REMOVAL Left 05/06/2023   Procedure: MINOR HARDWARE REMOVAL;  Surgeon: Lennie Barter, DPM;  Location: [facility];  Service: Orthopedics/Podiatry;  Laterality: Left;   NOSE SURGERY  1970   PARATHYROIDECTOMY  03/29/2020   RHINOPLASTY     teeth implants  2025   THYROID  SURGERY  03/2020   parathyroidectomy   TONSILLECTOMY  1950   VAGINAL DELIVERY     x2   WEIL OSTEOTOMY Right 05/06/2023   Procedure: WEIL OSTEOTOMY FIFTH;  Surgeon: Lennie Barter, DPM;  Location: [facility];  Service: Orthopedics/Podiatry;  Laterality: Right;   Family History  Problem Relation Age of Onset   Heart attack Mother 26       'mild heart attack'. No stent   Diabetes Father    Heart failure Father    Dementia Father    Heart disease Maternal Grandfather    Heart attack Maternal Grandfather    Stroke Paternal Grandmother    Dementia Paternal Grandfather    Diabetes Daughter    Asthma Daughter    Cancer Other        Breast, liver, lung   Breast cancer Neg Hx    Social History   Socioeconomic  History   Marital status: Married    Spouse name: Not on file   Number of children: 2   Years of education: Not on file   Highest education level: Not on file  Occupational History   Occupation: [employer]   Occupation: Retired  Tobacco Use   Smoking status: Never   Smokeless tobacco: Never  Vaping Use   Vaping status: Never Used  Substance and Sexual Activity   Alcohol use: Yes    Alcohol/week: 1.0 standard drink of alcohol    Types: 1 Glasses of wine per week    Comment: 1 glass a wine a week   Drug use: No   Sexual activity: Not Currently  Other Topics Concern   Not on file  Social History Narrative   Lives with husband and 2 grandchildren live with her- 5 total grandchildren. Works at [employer] in [employer], retired  2020      Regular Exercise -  Aerobic 30 min every day, walking   Daily Caffeine Use:  1 cup sw tea daily         Social Drivers of Health   Financial Resource Strain: Low Risk  (04/12/2024)   Overall Financial Resource Strain (CARDIA)    Difficulty of Paying Living Expenses: Not hard at all  Food Insecurity: No Food Insecurity (04/12/2024)   Hunger Vital Sign    Worried About Running Out of Food in the Last Year: Never true    Ran Out of Food in the Last Year: Never true  Transportation Needs: No Transportation Needs (04/12/2024)   PRAPARE - Administrator, Civil Service (Medical): No    Lack of Transportation (Non-Medical): No  Physical Activity: Insufficiently Active (04/12/2024)   Exercise Vital Sign    Days of Exercise per Week: 5 days    Minutes of Exercise per Session: 20 min  Stress: No Stress Concern Present (04/12/2024)   [employer] of Occupational Health - Occupational Stress Questionnaire    Feeling of Stress: Only a little  Social Connections: Moderately Integrated (04/12/2024)   Social Connection and Isolation Panel    Frequency of Communication with Friends and Family: More than  three times a week    Frequency of Social Gatherings with Friends and Family: More than three times a week    Attends Religious Services: More than 4 times per year    Active Member of [employer] or Organizations: No    Attends Banker Meetings: Never    Marital Status: Married    Tobacco Counseling Counseling given: Not Answered    Clinical Intake:  Pre-visit preparation completed: Yes  Pain : No/denies pain     BMI - recorded: 24.02 Nutritional Status: BMI of 19-24  Normal Nutritional Risks: None Diabetes: No  Lab Results  Component Value Date   HGBA1C 5.8 11/17/2023   HGBA1C 5.8 11/02/2022   HGBA1C 5.9 06/17/2022     How often do you need to have someone help you when you read instructions, pamphlets, or other written materials from your doctor or pharmacy?: 1 - Never  Interpreter Needed?: No  Information entered by :: R. Linzy Darling LPN   Activities of Daily Living     04/12/2024    8:49 AM 05/06/2023    6:34 AM  In your present state of health, do you have any difficulty performing the following activities:  Hearing? 0 0  Vision? 0 0  Difficulty concentrating or making decisions? 0 0  Walking or climbing stairs? 1 0  Dressing or bathing? 0 0  Doing errands, shopping? 0   Preparing Food and eating ? N   Using the Toilet? N   In the past six months, have you accidently leaked urine? Y   Do you have problems with loss of bowel control? N   Managing your Medications? N   Managing your Finances? N   Housekeeping or managing your Housekeeping? N     Patient Care Team: Dineen Rollene MATSU, FNP as PCP - General (Family Medicine) Perla Evalene PARAS, MD as PCP - Cardiology (Cardiology) Marchia Drivers, MD as Consulting Physician (Orthopedic Surgery)  I have updated your Care Teams any recent Medical Services you may have received from other providers in the past year.     Assessment:   This is a routine wellness examination for Mollye.  Hearing/Vision  screen Hearing Screening - Comments:: No issues Vision Screening -  Comments:: glasses   Goals Addressed             This Visit's Progress    Patient Stated       Wants to get her body tones up again       Depression Screen     04/12/2024    8:58 AM 11/17/2023    8:32 AM 05/05/2023    9:36 AM 01/13/2023    8:38 AM 11/02/2022    9:39 AM 05/05/2022   10:35 AM 12/15/2021   10:39 AM  PHQ 2/9 Scores  PHQ - 2 Score 0 0 0 0 0 0 0  PHQ- 9 Score 4    0  0    Fall Risk     04/12/2024    8:52 AM 11/17/2023    8:32 AM 05/05/2023    9:36 AM 01/13/2023    8:39 AM 11/02/2022    9:39 AM  Fall Risk   Falls in the past year? 1 0 0 0 0  Number falls in past yr: 0 0 0 0 0  Injury with Fall? 1 0 0 0 0  Risk for fall due to : History of fall(s);Impaired balance/gait No Fall Risks No Fall Risks No Fall Risks No Fall Risks  Risk for fall due to: Comment running after a dog      Follow up Falls evaluation completed;Falls prevention discussed Falls evaluation completed Falls evaluation completed Falls evaluation completed Falls evaluation completed    MEDICARE RISK AT HOME:  Medicare Risk at Home Any stairs in or around the home?: Yes If so, are there any without handrails?: No Home free of loose throw rugs in walkways, pet beds, electrical cords, etc?: Yes Adequate lighting in your home to reduce risk of falls?: Yes Life alert?: No Use of a cane, walker or w/c?: No Grab bars in the bathroom?: Yes Shower chair or bench in shower?: No Elevated toilet seat or a handicapped toilet?: No  TIMED UP AND GO:  Was the test performed?  No  Cognitive Function: 6CIT completed    06/25/2016    9:34 AM  MMSE - Mini Mental State Exam  Orientation to time 5   Orientation to Place 5   Registration 3   Attention/ Calculation 5   Recall 3   Language- name 2 objects 2   Language- repeat 1  Language- follow 3 step command 3   Language- read & follow direction 1   Write a sentence 1   Copy design 1    Total score 30      Data saved with a previous flowsheet row definition        04/12/2024    9:07 AM 01/13/2023    8:44 AM  6CIT Screen  What Year? 0 points 0 points  What month? 0 points 0 points  What time? 0 points 0 points  Count back from 20 0 points 0 points  Months in reverse 0 points 0 points  Repeat phrase 0 points 0 points  Total Score 0 points 0 points    Immunizations Immunization History  Administered Date(s) Administered   Influenza Split 06/25/2011, 06/13/2012   Influenza,inj,Quad PF,6+ Mos 09/05/2015, 06/03/2018   Influenza-Unspecified 06/08/2013, 06/26/2014, 06/07/2016   Pneumococcal Conjugate-13 12/15/2013   Pneumococcal Polysaccharide-23 12/12/2012   Tdap 03/11/2017    Screening Tests Health Maintenance  Topic Date Due   COVID-19 Vaccine (1) Never done   Zoster Vaccines- Shingrix (1 of 2) Never done   [employer] (  AWV)  01/13/2024   INFLUENZA VACCINE  03/24/2024   Colonoscopy  02/19/2027   DTaP/Tdap/Td (2 - Td or Tdap) 03/12/2027   Pneumococcal Vaccine: 50+ Years  Completed   DEXA SCAN  Completed   HPV VACCINES  Aged Out   Meningococcal B Vaccine  Aged Out   Hepatitis C Screening  Discontinued    Health Maintenance  Health Maintenance Due  Topic Date Due   COVID-19 Vaccine (1) Never done   Zoster Vaccines- Shingrix (1 of 2) Never done   Medicare Annual Wellness (AWV)  01/13/2024   INFLUENZA VACCINE  03/24/2024   Health Maintenance Items Addressed: Mammogram ordered Reminded patient that Dexa was ordered 11/17/23 telephone number provided. Patient declines vaccines  Additional Screening:  Vision Screening: Recommended annual ophthalmology exams for early detection of glaucoma and other disorders of the eye. Up to date   [facility] Would you like a referral to an eye doctor? No    Dental Screening: Recommended annual dental exams for proper oral hygiene  Community Resource Referral / Chronic Care Management: CRR required  this visit?  No   CCM required this visit?  No   Plan:    I have personally reviewed and noted the following in the patient's chart:   Medical and social history Use of alcohol, tobacco or illicit drugs  Current medications and supplements including opioid prescriptions. Patient is not currently taking opioid prescriptions. Functional ability and status Nutritional status Physical activity Advanced directives List of other physicians Hospitalizations, surgeries, and ER visits in previous 12 months Vitals Screenings to include cognitive, depression, and falls Referrals and appointments  In addition, I have reviewed and discussed with patient certain preventive protocols, quality metrics, and best practice recommendations. A written personalized care plan for preventive services as well as general preventive health recommendations were provided to patient.   Angeline Fredericks, LPN   1/79/7974   After Visit Summary: (MyChart) Due to this being a telephonic visit, the after visit summary with patients personalized plan was offered to patient via MyChart   Notes: Nothing significant to report at this time.

## 2024-04-12 NOTE — Patient Instructions (Signed)
 Melissa Turner , Thank you for taking time out of your busy schedule to complete your Annual Wellness Visit with me. I enjoyed our conversation and look forward to speaking with you again next year. I, as well as your care team,  appreciate your ongoing commitment to your health goals. Please review the following plan we discussed and let me know if I can assist you in the future. Your Game plan/ To Do List    Referrals: If you haven't heard from the office you've been referred to, please reach out to them at the phone provided.  Remember to call and schedule your Dexa that was ordered 11/17/23. An order has also been placed today for your annual mammogram.  You have an order for:  []   2D Mammogram  [x]   3D Mammogram  [x]   Bone Density     Please call for appointment:  [facility]  [address] [place] [address] [phone]   Make sure to wear two-piece clothing.  No lotions, powders, or deodorants the day of the appointment. Make sure to bring picture ID and insurance card.  Bring list of medications you are currently taking including any supplements.    Follow up Visits: We will see or speak with you next year for your Next Medicare AWV with our clinical staff 04/16/25 @ 10:10 Have you seen your provider in the last 6 months (3 months if uncontrolled diabetes)? Yes  Clinician Recommendations:  Aim for 30 minutes of exercise or brisk walking, 6-8 glasses of water, and 5 servings of fruits and vegetables each day.       This is a list of the screenings recommended for you:  Health Maintenance  Topic Date Due   COVID-19 Vaccine (1) Never done   Zoster (Shingles) Vaccine (1 of 2) Never done   Flu Shot  03/24/2024   Mammogram  06/09/2024   Medicare Annual Wellness Visit  04/12/2025   Colon Cancer Screening  02/19/2027   DTaP/Tdap/Td vaccine (2 - Td or Tdap) 03/12/2027   Pneumococcal Vaccine for age over 81  Completed   DEXA  scan (bone density measurement)  Completed   HPV Vaccine  Aged Out   Meningitis B Vaccine  Aged Out   Hepatitis C Screening  Discontinued    Advanced directives: (Copy Requested) Please bring a copy of your health care power of attorney and living will to the office to be added to your chart at your convenience. You can mail to [facility] [address] [place], [place] [address] or email to [email or web address] Advance Care Planning is important because it:  [x]  Makes sure you receive the medical care that is consistent with your values, goals, and preferences  [x]  It provides guidance to your family and loved ones and reduces their decisional burden about whether or not they are making the right decisions based on your wishes.

## 2024-04-12 NOTE — Telephone Encounter (Signed)
 Hi, insurance has now approved the repatha . I sent the patient a message and scanned approval in media. Thank you

## 2024-05-15 ENCOUNTER — Ambulatory Visit
Admission: RE | Admit: 2024-05-15 | Discharge: 2024-05-15 | Disposition: A | Discharge status: Home / Self Care | Source: Ambulatory Visit | Attending: Family | Admitting: Family

## 2024-05-15 DIAGNOSIS — M8589 Other specified disorders of bone density and structure, multiple sites: Secondary | ICD-10-CM | POA: Diagnosis not present

## 2024-05-15 DIAGNOSIS — Z780 Asymptomatic menopausal state: Secondary | ICD-10-CM | POA: Insufficient documentation

## 2024-05-18 ENCOUNTER — Ambulatory Visit: Payer: Self-pay | Admitting: Family

## 2024-06-12 ENCOUNTER — Ambulatory Visit
Admission: RE | Admit: 2024-06-12 | Discharge: 2024-06-12 | Disposition: A | Discharge status: Home / Self Care | Source: Ambulatory Visit | Attending: Family | Admitting: Family

## 2024-06-12 ENCOUNTER — Other Ambulatory Visit: Payer: Self-pay | Admitting: Family

## 2024-06-12 DIAGNOSIS — Z1231 Encounter for screening mammogram for malignant neoplasm of breast: Secondary | ICD-10-CM | POA: Insufficient documentation

## 2024-06-12 DIAGNOSIS — N6320 Unspecified lump in the left breast, unspecified quadrant: Secondary | ICD-10-CM

## 2024-06-14 ENCOUNTER — Ambulatory Visit: Admitting: Dermatology

## 2024-06-14 ENCOUNTER — Ambulatory Visit

## 2024-06-14 ENCOUNTER — Encounter: Payer: Self-pay | Admitting: Dermatology

## 2024-06-14 DIAGNOSIS — L578 Other skin changes due to chronic exposure to nonionizing radiation: Secondary | ICD-10-CM

## 2024-06-14 DIAGNOSIS — W908XXA Exposure to other nonionizing radiation, initial encounter: Secondary | ICD-10-CM | POA: Diagnosis not present

## 2024-06-14 DIAGNOSIS — L821 Other seborrheic keratosis: Secondary | ICD-10-CM

## 2024-06-14 DIAGNOSIS — L820 Inflamed seborrheic keratosis: Secondary | ICD-10-CM

## 2024-06-14 DIAGNOSIS — L814 Other melanin hyperpigmentation: Secondary | ICD-10-CM

## 2024-06-14 DIAGNOSIS — Z7189 Other specified counseling: Secondary | ICD-10-CM

## 2024-06-14 NOTE — Progress Notes (Signed)
   Follow-Up Visit   Subjective  Melissa Turner is a 83 y.o. female who presents for the following: The patient has spots, moles and lesions to be evaluated, some may be new or changing and the patient may have concern these could be cancer.  The following portions of the chart were reviewed this encounter and updated as appropriate: medications, allergies, medical history  Review of Systems:  No other skin or systemic complaints except as noted in HPI or Assessment and Plan.  Objective  Well appearing patient in no apparent distress; mood and affect are within normal limits.  A focused examination was performed of the following areas:  Relevant exam findings are noted in the Assessment and Plan.  L nasal root x 1, R inframammary x 15, R leg x 6 (22) Stuck on waxy paps with erythema  Assessment & Plan   ACTINIC DAMAGE - chronic, secondary to cumulative UV radiation exposure/sun exposure over time - diffuse scaly erythematous macules with underlying dyspigmentation - Recommend daily broad spectrum sunscreen SPF 30+ to sun-exposed areas, reapply every 2 hours as needed.  - Recommend staying in the shade or wearing long sleeves, sun glasses (UVA+UVB protection) and wide brim hats (4-inch brim around the entire circumference of the hat). - Call for new or changing lesions.  SEBORRHEIC KERATOSIS - Stuck-on, waxy, tan-brown papules and/or plaques  - Benign-appearing - Discussed benign etiology and prognosis. - Observe - Call for any changes  LENTIGINES  Lentigo of R lower lip vermillion Exam: scattered tan macules, lower lip 0.4 cm Due to sun exposure Treatment Plan: Benign-appearing, observe. Recommend daily broad spectrum sunscreen SPF 30+ to sun-exposed areas, reapply every 2 hours as needed.  Call for any changes  INFLAMED SEBORRHEIC KERATOSIS (22) L nasal root x 1, R inframammary x 15, R leg x 6 (22) Symptomatic, irritating, patient would like treated. Destruction of  lesion - L nasal root x 1, R inframammary x 15, R leg x 6 (22) Complexity: simple   Destruction method: cryotherapy   Informed consent: discussed and consent obtained   Timeout:  patient name, date of birth, surgical site, and procedure verified Lesion destroyed using liquid nitrogen: Yes   Region frozen until ice ball extended beyond lesion: Yes   Outcome: patient tolerated procedure well with no complications   Post-procedure details: wound care instructions given     Return in about 6 months (around 12/13/2024) for ISK follow up.  LILLETTE Rosina Mayans, CMA, am acting as scribe for Alm Rhyme, MD .   Documentation: I have reviewed the above documentation for accuracy and completeness, and I agree with the above.  Alm Rhyme, MD

## 2024-06-14 NOTE — Patient Instructions (Signed)

## 2024-06-16 NOTE — Progress Notes (Signed)
 Melissa Turner                                          MRN: [id number]   06/16/2024   The [facility]I Quality Team Specialist reviewed this patient medical record for the purposes of chart review for care gap closure. The following were reviewed: chart review for care gap closure-controlling blood pressure.    VBCI Quality Team

## 2024-06-21 ENCOUNTER — Ambulatory Visit
Admission: RE | Admit: 2024-06-21 | Discharge: 2024-06-21 | Disposition: A | Discharge status: Home / Self Care | Source: Ambulatory Visit | Attending: Family | Admitting: Family

## 2024-06-21 ENCOUNTER — Ambulatory Visit: Payer: Self-pay | Admitting: Family

## 2024-06-21 ENCOUNTER — Ambulatory Visit
Admission: RE | Admit: 2024-06-21 | Discharge: 2024-06-21 | Disposition: A | Source: Ambulatory Visit | Attending: Family

## 2024-06-21 DIAGNOSIS — R928 Other abnormal and inconclusive findings on diagnostic imaging of breast: Secondary | ICD-10-CM | POA: Diagnosis not present

## 2024-06-21 DIAGNOSIS — N6320 Unspecified lump in the left breast, unspecified quadrant: Secondary | ICD-10-CM

## 2024-06-21 DIAGNOSIS — N6325 Unspecified lump in the left breast, overlapping quadrants: Secondary | ICD-10-CM | POA: Diagnosis not present

## 2024-07-25 ENCOUNTER — Other Ambulatory Visit: Payer: Self-pay | Admitting: Family

## 2024-07-25 DIAGNOSIS — F419 Anxiety disorder, unspecified: Secondary | ICD-10-CM

## 2024-10-31 ENCOUNTER — Ambulatory Visit: Admitting: Cardiovascular Disease

## 2024-11-21 ENCOUNTER — Encounter: Admitting: Family

## 2024-12-05 ENCOUNTER — Ambulatory Visit: Admitting: Dermatology

## 2025-04-16 ENCOUNTER — Ambulatory Visit
